# Patient Record
Sex: Female | Born: 1960 | Race: White | Hispanic: No | Smoking: Never smoker
Health system: Southern US, Community
[De-identification: ages and names within clinical notes are randomized; demographics above are authoritative.]

## PROBLEM LIST (undated history)

## (undated) DIAGNOSIS — I499 Cardiac arrhythmia, unspecified: Secondary | ICD-10-CM

## (undated) DIAGNOSIS — K439 Ventral hernia without obstruction or gangrene: Secondary | ICD-10-CM

## (undated) DIAGNOSIS — J45909 Unspecified asthma, uncomplicated: Secondary | ICD-10-CM

## (undated) DIAGNOSIS — Z9221 Personal history of antineoplastic chemotherapy: Secondary | ICD-10-CM

## (undated) DIAGNOSIS — M1990 Unspecified osteoarthritis, unspecified site: Secondary | ICD-10-CM

## (undated) DIAGNOSIS — Z923 Personal history of irradiation: Secondary | ICD-10-CM

## (undated) DIAGNOSIS — C539 Malignant neoplasm of cervix uteri, unspecified: Secondary | ICD-10-CM

## (undated) DIAGNOSIS — E119 Type 2 diabetes mellitus without complications: Secondary | ICD-10-CM

## (undated) DIAGNOSIS — F419 Anxiety disorder, unspecified: Secondary | ICD-10-CM

## (undated) HISTORY — PX: JOINT REPLACEMENT: SHX530

## (undated) HISTORY — PX: TUBAL LIGATION: SHX77

## (undated) HISTORY — DX: Malignant neoplasm of cervix uteri, unspecified: C53.9

## (undated) HISTORY — DX: Cardiac arrhythmia, unspecified: I49.9

## (undated) HISTORY — DX: Anxiety disorder, unspecified: F41.9

## (undated) HISTORY — DX: Unspecified osteoarthritis, unspecified site: M19.90

## (undated) HISTORY — PX: HIP SURGERY: SHX245

---

## 2004-10-01 ENCOUNTER — Emergency Department: Payer: Self-pay | Admitting: Emergency Medicine

## 2005-09-17 ENCOUNTER — Emergency Department: Payer: Self-pay | Admitting: Internal Medicine

## 2005-09-17 ENCOUNTER — Other Ambulatory Visit: Payer: Self-pay

## 2005-09-25 ENCOUNTER — Emergency Department: Payer: Self-pay | Admitting: Emergency Medicine

## 2005-11-29 ENCOUNTER — Emergency Department: Payer: Self-pay | Admitting: Emergency Medicine

## 2005-11-29 ENCOUNTER — Other Ambulatory Visit: Payer: Self-pay

## 2007-08-25 ENCOUNTER — Emergency Department: Payer: Self-pay | Admitting: Emergency Medicine

## 2008-04-11 ENCOUNTER — Emergency Department: Payer: Self-pay | Admitting: Internal Medicine

## 2008-04-27 ENCOUNTER — Ambulatory Visit: Payer: Self-pay | Admitting: Orthopedic Surgery

## 2008-08-15 ENCOUNTER — Emergency Department: Payer: Self-pay | Admitting: Emergency Medicine

## 2008-09-23 ENCOUNTER — Ambulatory Visit: Payer: Self-pay | Admitting: Orthopedic Surgery

## 2008-09-29 ENCOUNTER — Inpatient Hospital Stay: Payer: Self-pay | Admitting: Orthopedic Surgery

## 2014-04-11 ENCOUNTER — Emergency Department: Payer: Self-pay | Admitting: Emergency Medicine

## 2014-04-12 LAB — BASIC METABOLIC PANEL
ANION GAP: 7 (ref 7–16)
BUN: 13 mg/dL (ref 7–18)
CALCIUM: 8.9 mg/dL (ref 8.5–10.1)
CO2: 25 mmol/L (ref 21–32)
Chloride: 106 mmol/L (ref 98–107)
Creatinine: 0.68 mg/dL (ref 0.60–1.30)
EGFR (African American): >60
GLUCOSE: 170 mg/dL — AB (ref 65–99)
Osmolality: 280 (ref 275–301)
Potassium: 3.8 mmol/L (ref 3.5–5.1)
Sodium: 138 mmol/L (ref 136–145)

## 2014-04-12 LAB — TROPONIN I
Troponin-I: <0.02 ng/mL
Troponin-I: <0.02 ng/mL

## 2014-04-12 LAB — CBC
HCT: 40.5 % (ref 35.0–47.0)
HGB: 13.2 g/dL (ref 12.0–16.0)
MCH: 30.3 pg (ref 26.0–34.0)
MCHC: 32.7 g/dL (ref 32.0–36.0)
MCV: 93 fL (ref 80–100)
Platelet: 228 10*3/uL (ref 150–440)
RBC: 4.37 10*6/uL (ref 3.80–5.20)
RDW: 12.6 % (ref 11.5–14.5)
WBC: 10.5 10*3/uL (ref 3.6–11.0)

## 2014-04-12 LAB — CK TOTAL AND CKMB (NOT AT ARMC)
CK, Total: 61 U/L
CK-MB: 1.3 ng/mL (ref 0.5–3.6)

## 2014-12-17 ENCOUNTER — Ambulatory Visit: Payer: Self-pay | Admitting: Family Medicine

## 2014-12-22 ENCOUNTER — Ambulatory Visit: Payer: Self-pay | Admitting: Family Medicine

## 2016-03-01 ENCOUNTER — Other Ambulatory Visit: Payer: Self-pay | Admitting: Family Medicine

## 2016-03-09 ENCOUNTER — Other Ambulatory Visit: Payer: Self-pay | Admitting: Family Medicine

## 2016-03-09 DIAGNOSIS — Z1231 Encounter for screening mammogram for malignant neoplasm of breast: Secondary | ICD-10-CM

## 2016-03-23 ENCOUNTER — Other Ambulatory Visit: Payer: Self-pay | Admitting: Family Medicine

## 2016-03-23 ENCOUNTER — Ambulatory Visit
Admission: RE | Admit: 2016-03-23 | Discharge: 2016-03-23 | Disposition: A | Discharge status: Home / Self Care | Payer: Medicare Other | Source: Ambulatory Visit | Attending: Family Medicine | Admitting: Family Medicine

## 2016-03-23 DIAGNOSIS — Z1231 Encounter for screening mammogram for malignant neoplasm of breast: Secondary | ICD-10-CM | POA: Diagnosis present

## 2016-05-02 DIAGNOSIS — Z96649 Presence of unspecified artificial hip joint: Secondary | ICD-10-CM | POA: Insufficient documentation

## 2016-05-02 DIAGNOSIS — M25559 Pain in unspecified hip: Secondary | ICD-10-CM | POA: Insufficient documentation

## 2016-07-19 ENCOUNTER — Other Ambulatory Visit: Payer: Self-pay | Admitting: Orthopedic Surgery

## 2016-07-19 DIAGNOSIS — M25551 Pain in right hip: Secondary | ICD-10-CM

## 2016-07-19 DIAGNOSIS — Z96641 Presence of right artificial hip joint: Secondary | ICD-10-CM

## 2016-08-01 ENCOUNTER — Ambulatory Visit
Admission: RE | Admit: 2016-08-01 | Discharge: 2016-08-01 | Disposition: A | Discharge status: Home / Self Care | Payer: Medicare Other | Source: Ambulatory Visit | Attending: Orthopedic Surgery | Admitting: Orthopedic Surgery

## 2016-08-01 DIAGNOSIS — M5136 Other intervertebral disc degeneration, lumbar region: Secondary | ICD-10-CM | POA: Insufficient documentation

## 2016-08-01 DIAGNOSIS — M8538 Osteitis condensans, other site: Secondary | ICD-10-CM | POA: Diagnosis not present

## 2016-08-01 DIAGNOSIS — Z96641 Presence of right artificial hip joint: Secondary | ICD-10-CM | POA: Insufficient documentation

## 2016-08-01 DIAGNOSIS — M47896 Other spondylosis, lumbar region: Secondary | ICD-10-CM | POA: Diagnosis not present

## 2016-08-01 DIAGNOSIS — M25551 Pain in right hip: Secondary | ICD-10-CM | POA: Diagnosis not present

## 2017-02-08 ENCOUNTER — Telehealth: Payer: Self-pay | Admitting: Obstetrics & Gynecology

## 2017-02-08 NOTE — Telephone Encounter (Signed)
[facility] is referring Pt for Cervical Lesion postmenopausal Bleeding. I Left voicemail for pt to call back to be schedule.

## 2017-02-12 NOTE — Telephone Encounter (Signed)
Pt is schedule with Dr. Kenton Kingfisher 02/26/17

## 2017-02-23 ENCOUNTER — Telehealth: Payer: Self-pay | Admitting: Obstetrics & Gynecology

## 2017-02-23 NOTE — Telephone Encounter (Signed)
Spoke with pt about reschedule referral. Pt states she preferred to see an Female MD. Will be referred to different provider.

## 2017-02-23 NOTE — Telephone Encounter (Signed)
-----   Message from Gae Dry, MD sent at 02/23/2017 10:31 AM EDT ----- Regarding: appt Recheck on appointment for this patient as I do not see her on schedule.  Thx.

## 2017-02-26 ENCOUNTER — Ambulatory Visit: Payer: Self-pay | Admitting: Obstetrics & Gynecology

## 2017-02-26 ENCOUNTER — Other Ambulatory Visit: Payer: Self-pay | Admitting: Family Medicine

## 2017-02-26 DIAGNOSIS — Z1231 Encounter for screening mammogram for malignant neoplasm of breast: Secondary | ICD-10-CM

## 2017-03-23 DIAGNOSIS — C539 Malignant neoplasm of cervix uteri, unspecified: Secondary | ICD-10-CM

## 2017-03-23 HISTORY — DX: Malignant neoplasm of cervix uteri, unspecified: C53.9

## 2017-03-26 ENCOUNTER — Ambulatory Visit
Admission: RE | Admit: 2017-03-26 | Discharge: 2017-03-26 | Disposition: A | Discharge status: Home / Self Care | Payer: Medicare Other | Source: Ambulatory Visit | Attending: Family Medicine | Admitting: Family Medicine

## 2017-03-26 DIAGNOSIS — Z1231 Encounter for screening mammogram for malignant neoplasm of breast: Secondary | ICD-10-CM | POA: Insufficient documentation

## 2017-03-28 ENCOUNTER — Encounter: Payer: Self-pay | Admitting: Obstetrics & Gynecology

## 2017-03-28 ENCOUNTER — Emergency Department
Admission: EM | Admit: 2017-03-28 | Discharge: 2017-03-28 | Discharge status: Against Medical Advice | Payer: Medicare Other | Attending: Emergency Medicine | Admitting: Emergency Medicine

## 2017-03-28 ENCOUNTER — Encounter: Payer: Self-pay | Admitting: Medical Oncology

## 2017-03-28 ENCOUNTER — Emergency Department: Payer: Medicare Other

## 2017-03-28 ENCOUNTER — Ambulatory Visit (INDEPENDENT_AMBULATORY_CARE_PROVIDER_SITE_OTHER): Payer: Medicare Other | Admitting: Obstetrics & Gynecology

## 2017-03-28 VITALS — BP 140/80 | HR 93 | Ht 59.0 in | Wt 150.0 lb

## 2017-03-28 DIAGNOSIS — R87619 Unspecified abnormal cytological findings in specimens from cervix uteri: Secondary | ICD-10-CM

## 2017-03-28 DIAGNOSIS — N950 Postmenopausal bleeding: Secondary | ICD-10-CM | POA: Diagnosis not present

## 2017-03-28 DIAGNOSIS — N3090 Cystitis, unspecified without hematuria: Secondary | ICD-10-CM | POA: Diagnosis not present

## 2017-03-28 DIAGNOSIS — M47816 Spondylosis without myelopathy or radiculopathy, lumbar region: Secondary | ICD-10-CM | POA: Diagnosis not present

## 2017-03-28 DIAGNOSIS — M545 Low back pain: Secondary | ICD-10-CM | POA: Diagnosis present

## 2017-03-28 LAB — URINALYSIS, COMPLETE (UACMP) WITH MICROSCOPIC
BACTERIA UA: NONE SEEN
BILIRUBIN URINE: NEGATIVE
GLUCOSE, UA: NEGATIVE mg/dL
HGB URINE DIPSTICK: NEGATIVE
Ketones, ur: NEGATIVE mg/dL
NITRITE: NEGATIVE
Protein, ur: NEGATIVE mg/dL
Specific Gravity, Urine: 1.025 (ref 1.005–1.030)
pH: 5 (ref 5.0–8.0)

## 2017-03-28 LAB — PREGNANCY, URINE: PREG TEST UR: NEGATIVE

## 2017-03-28 MED ORDER — CEPHALEXIN 500 MG PO CAPS: 500.0000 mg | ORAL_CAPSULE | Freq: Two times a day (BID) | ORAL | 0 refills | Status: AC

## 2017-03-28 NOTE — ED Notes (Signed)
Pt presents with back pain that feels like burning x 4 days. Pt states that it goes from lower to upper back. Pt had recent hip replacement and is going to have a procedure on uterus today. Pt took ibuprofen with no relief. NAD Noted.

## 2017-03-28 NOTE — ED Provider Notes (Signed)
[facility] Emergency Department Provider Note  ____________________________________________  Time seen: Approximately 12:08 PM  I have reviewed the triage vital signs and the nursing notes.   HISTORY  Chief Complaint Back Pain    HPI Kendra Herring is a 56 y.o. female that presents to emergency department with low midline back pain for 3 days. Pain starts at the bottom of her back and goes up her back about midway. She has an appointment today with gynecology for an abnormal Pap smear that she recently had. She has never had a urinary tract infection or kidney stone before. She denies fever, shortness of breath, chest pain, nausea, vomiting, abdominal pain, bowel or bladder dysfunction, saddle paresthesias, dysuria, hematuria, urgency, frequency, leg pain, numbness, tingling.   Past Medical History:  Diagnosis Date  . Anxiety   . Arthritis     There are no active problems to display for this patient.   Past Surgical History:  Procedure Laterality Date  . HIP SURGERY    . TUBAL LIGATION      Prior to Admission medications   Medication Sig Start Date End Date Taking? Authorizing Provider  cephALEXin (KEFLEX) 500 MG capsule Take 1 capsule (500 mg total) by mouth 2 (two) times daily. 03/28/17 04/07/17  Laban Emperor, PA-C    Allergies Other  Family History  Problem Relation Age of Onset  . Diabetes Mother     Social History Social History  Substance Use Topics  . Smoking status: Never Smoker  . Smokeless tobacco: Never Used  . Alcohol use No     Review of Systems  Constitutional: No fever/chills Cardiovascular: No chest pain. Respiratory: No SOB. Gastrointestinal: No abdominal pain.  No nausea, no vomiting.  Musculoskeletal: Positive for back pain. Skin: Negative for rash, abrasions, lacerations, ecchymosis. Neurological: Negative for headaches, numbness or tingling   ____________________________________________   PHYSICAL  EXAM:  VITAL SIGNS: ED Triage Vitals  Enc Vitals Group     BP 03/28/17 1022 (!) 151/80     Pulse Rate 03/28/17 1022 95     Resp 03/28/17 1022 18     Temp 03/28/17 1022 98.1 F (36.7 C)     Temp Source 03/28/17 1022 Oral     SpO2 03/28/17 1022 97 %     Weight 03/28/17 1023 140 lb (63.5 kg)     Height 03/28/17 1023 4\' 11"  (1.499 m)     Head Circumference --      Peak Flow --      Pain Score 03/28/17 1022 9     Pain Loc --      Pain Edu? --      Excl. in [place]? --      Constitutional: Alert and oriented. Well appearing and in no acute distress. Eyes: Conjunctivae are normal. PERRL. EOMI. Head: Atraumatic. ENT:      Ears:      Nose: No congestion/rhinnorhea.      Mouth/Throat: Mucous membranes are moist.  Neck: No stridor.   Cardiovascular: Normal rate, regular rhythm.  Good peripheral circulation. Respiratory: Normal respiratory effort without tachypnea or retractions. Lungs CTAB. Good air entry to the bases with no decreased or absent breath sounds. Gastrointestinal: Bowel sounds 4 quadrants. Soft and nontender to palpation. No guarding or rigidity. No palpable masses. No distention. No CVA tenderness. Musculoskeletal: Full range of motion to all extremities. No gross deformities appreciated. Tenderness to palpation over midline lumbar spine. No tenderness to palpation over lumbar muscles. Neurologic:  Normal speech and  language. No gross focal neurologic deficits are appreciated.  Skin:  Skin is warm, dry and intact. No rash noted.   ____________________________________________   LABS (all labs ordered are listed, but only abnormal results are displayed)  Labs Reviewed  URINALYSIS, COMPLETE (UACMP) WITH MICROSCOPIC - Abnormal; Notable for the following:       Result Value   Color, Urine YELLOW (*)    APPearance CLEAR (*)    Leukocytes, UA MODERATE (*)    Squamous Epithelial / LPF 0-5 (*)    All other components within normal limits  URINE CULTURE  PREGNANCY, URINE    ____________________________________________  EKG   ____________________________________________  RADIOLOGY Robinette Haines, personally viewed and evaluated these images (plain radiographs) as part of my medical decision making, as well as reviewing the written report by the radiologist.  Dg Lumbar Spine Complete  Result Date: 03/28/2017 CLINICAL DATA:  Low back pain. EXAM: LUMBAR SPINE - COMPLETE 4+ VIEW COMPARISON:  Chest x-ray dated 04/12/2014 FINDINGS: There is lumbarization of the S1 segment. There is moderately severe degenerative disc disease at L4-5 and L5-S1 with disc space narrowing and sclerosis of the vertebral endplates. Alignment is anatomic. Moderately severe facet arthritis at L4-5 and L5-S1 bilaterally. No fractures or bone destruction. 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 lumbarization of the S1 segment. Electronically Signed   By: Lorriane Shire M.D.   On: 03/28/2017 12:22   Ct Renal Stone Study  Result Date: 03/28/2017 CLINICAL DATA:  Back pain for 4 days EXAM: CT ABDOMEN AND PELVIS WITHOUT CONTRAST TECHNIQUE: Multidetector CT imaging of the abdomen and pelvis was performed following the standard protocol without IV contrast. COMPARISON:  03/28/2017 FINDINGS: Lower chest: Lung bases are clear. No consolidation or effusion. The heart is nonenlarged. Hepatobiliary: No focal liver abnormality is seen. No gallstones, gallbladder wall thickening, or biliary dilatation. Pancreas: Unremarkable. No pancreatic ductal dilatation or surrounding inflammatory changes. Spleen: Normal in size without focal abnormality. Adrenals/Urinary Tract: Adrenal glands are unremarkable. Kidneys are normal, without renal calculi, focal lesion, or hydronephrosis. Bladder is unremarkable. Stomach/Bowel: Stomach is within normal limits. Appendix appears normal. No evidence of bowel wall thickening, distention, or inflammatory changes. Vascular/Lymphatic: No  significant vascular findings are present. No enlarged abdominal or pelvic lymph nodes. Reproductive: Uterus and bilateral adnexa are unremarkable. Other: No free air or free fluid. Musculoskeletal: Degenerative changes of the spine. No acute or suspicious bone lesion. IMPRESSION: 1. Negative for nephrolithiasis, hydronephrosis or ureteral stone. 2. There are no acute abnormalities visualized. Electronically Signed   By: Donavan Foil M.D.   On: 03/28/2017 14:51    ____________________________________________    PROCEDURES  Procedure(s) performed:    Procedures    Medications - No data to display   ____________________________________________   INITIAL IMPRESSION / ASSESSMENT AND PLAN / ED COURSE  Pertinent labs & imaging results that were available during my care of the patient were reviewed by me and considered in my medical decision making (see chart for details).  Review of the [place] CSRS was performed in accordance of the [place] prior to dispensing any controlled drugs.  Patient's diagnosis is consistent with cystitis and osteoarthritis. Vital signs and exam are reassuring. Patient seems to have a lot of anxiety regarding her colposcopy today. Lumbar x-ray indicates osteoarthritis. Urinalysis shows leukocytes and white blood cells she will be treated for urinary tract infection. Patient had to leave the ED for her gynecology appointment before results of CT renal stone study was completed  so she signed out AMA. No indication of any stone or hydronephrosis on CT. Patient will be discharged home with prescriptions for Keflex. Patient is to follow up with PCP as directed. Patient is given ED precautions to return to the ED for any worsening or new symptoms.     ____________________________________________  FINAL CLINICAL IMPRESSION(S) / ED DIAGNOSES  Final diagnoses:  Spondylosis of lumbar region without myelopathy or radiculopathy  Cystitis      NEW MEDICATIONS STARTED  DURING THIS VISIT:  Discharge Medication List as of 03/28/2017  2:29 PM    START taking these medications   Details  cephALEXin (KEFLEX) 500 MG capsule Take 1 capsule (500 mg total) by mouth 2 (two) times daily., Starting Wed 03/28/2017, Until Sat 04/07/2017, Print            This chart was dictated using voice recognition software/Dragon. Despite best efforts to proofread, errors can occur which can change the meaning. Any change was purely unintentional.    Laban Emperor, PA-C 03/28/17 1535    Burlene Arnt Gerda Diss, MD 03/29/17 1110

## 2017-03-28 NOTE — ED Triage Notes (Signed)
Pt reports lower back pain that began 3-4 days ago without injury. Pt reports pain radiates into rt hip

## 2017-03-28 NOTE — Patient Instructions (Signed)

## 2017-03-28 NOTE — ED Notes (Signed)
Called lab to call their attention to preg add-on test.

## 2017-03-28 NOTE — Progress Notes (Signed)
Referring Provider:  Princella Ion  HPI:  Kendra Herring is a 56 y.o.  [phone]  who presents today for evaluation and management of abnormal cervical cytology.  Pt also had abnormal exam appearance of cervix.  She first noted spotting and bleeding, which is post menopausal as ir has been > 1 year without a period, 2 mos ago.  Last PAP uncertain.  Dysplasia History:  AGUS, FAVOR HIGH RISK CELLS  ROS:  Pertinent items noted in HPI and remainder of comprehensive ROS otherwise negative.  OB History  Gravida Para Term Preterm AB Living  3 3 3     3   SAB TAB Ectopic Multiple Live Births               # Outcome Date GA Lbr Len/2nd Weight Sex Delivery Anes PTL Lv  3 Term 08/05/89          2 Term 09/09/79          1 Term 10/16/69              Past Medical History:  Diagnosis Date  . Anxiety   . Arthritis     Past Surgical History:  Procedure Laterality Date  . HIP SURGERY    . TUBAL LIGATION      SOCIAL HISTORY: History  Alcohol Use No   History  Drug Use No     Family History  Problem Relation Age of Onset  . Diabetes Mother     ALLERGIES:  Other  No current outpatient prescriptions on file prior to visit.   No current facility-administered medications on file prior to visit.     Physical Exam: -Vitals:  BP 140/80   Pulse 93   Ht 4\' 11"  (1.499 m)   Wt 150 lb (68 kg)   BMI 30.30 kg/m  GEN: WD, WN, NAD.  A+ O x 3, good mood and affect. ABD:  NT, ND.  Soft, no masses.  No hernias noted.   Pelvic:   Vulva: Normal appearance.  No lesions.  Vagina: No lesions or abnormalities noted.  Support: Normal pelvic support.  Urethra No masses tenderness or scarring.  Meatus Normal size without lesions or prolapse.  Cervix: See below.  Anus: Normal exam.  No lesions.  Perineum: Normal exam.  No lesions.        Bimanual   Uterus: Normal size.  Non-tender.  Mobile.  AV.  Adnexae: No masses.  Non-tender to palpation.  Cul-de-sac: Negative for abnormality.    PROCEDURE: 1.  Urine Pregnancy Test:  not done 2.  Colposcopy performed with 4% acetic acid after verbal consent obtained                                         -Aceto-white Lesions Location(s): throughout, abnormal appearance, contact bleeding              -Biopsy performed at 10,12,6 o'clock               -ECC indicated and performed: Yes.       -Biopsy sites made hemostatic with pressure, AgNO3, and/or Monsel's solution   -Satisfactory colposcopy: Yes.      -Evidence of Invasive cervical CA :  Possible  Endometrial Biopsy After discussion with the patient regarding her abnormal uterine bleeding I recommended that she proceed with an endometrial biopsy for further diagnosis. The risks, benefits, alternatives, and  indications for an endometrial biopsy were discussed with the patient in detail. She understood the risks including infection, bleeding, cervical laceration and uterine perforation.  Verbal consent was obtained.   PROCEDURE NOTE:  P[place] endometrial biopsy was performed using aseptic technique with iodine preparation.  The uterus was sounded to a length of 6 cm.  Adequate sampling was obtained with minimal blood loss.  The patient tolerated the procedure well.  Disposition will be pending pathology.  ASSESSMENT:  Kendra Herring is a 56 y.o. [phone] here for  1. Postmenopausal bleeding   2. 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  PLAN: 1.  I discussed the grading system of pap smears and HPV high risk viral types.  We will discuss and base management after colpo results return. 2. Follow up PAP 6 months, vs intervention if high grade dysplasia identified 3. Treatment of persistantly abnormal PAP smears and cervical dysplasia, even mild, is discussed w pt today in detail, as well as the pros and cons of Cryo and LEEP procedures. Will consider and discuss after results. 4. EMB due to AGUS and PMB 5. Referral if cancer to [place], MD, [facility] 03/28/2017  4:26 PM

## 2017-03-29 LAB — URINE CULTURE: CULTURE: NO GROWTH

## 2017-04-02 ENCOUNTER — Telehealth: Payer: Self-pay

## 2017-04-02 ENCOUNTER — Telehealth: Payer: Self-pay | Admitting: Obstetrics & Gynecology

## 2017-04-02 LAB — PATHOLOGY

## 2017-04-02 NOTE — Telephone Encounter (Signed)
Pt is would like a call back of Labs results. Pt is nervous about her result. Please advise pt.

## 2017-04-02 NOTE — Telephone Encounter (Signed)
Not back yet...   Let her know will call as soon as get it.

## 2017-04-02 NOTE — Progress Notes (Signed)
EMB Neg.  Awaiting cervical biopsies, then to call patient w results.

## 2017-04-02 NOTE — Telephone Encounter (Signed)
Dr. Fonnie Mu called with results from Colposcopy. 4 specimens were received. 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 Fax number was given to receive results. The Dr. Lenon Ahmadi [facility] can call with any questions at [phone]. Results will be faxed later this afternoon.   KJ CMA

## 2017-04-02 NOTE — Telephone Encounter (Signed)
Pt aware.

## 2017-04-02 NOTE — Telephone Encounter (Signed)
Please advise. Thank you

## 2017-04-03 NOTE — Progress Notes (Signed)
LM to call back.  Cervical Biopsies- cancer.

## 2017-04-03 NOTE — Telephone Encounter (Signed)
Results received yesterday and placed on [facility] desk for review

## 2017-04-05 NOTE — Progress Notes (Signed)
[phone] pt phone number, pt aware you will be calling her to go over results

## 2017-04-05 NOTE — Progress Notes (Signed)
I have tried to reach patient several phone calls with no answer.  Please see if you can arrange appt to check on patient (and also for me to give results and plan accordingly).  Or if she has no time then at least a time or number I can call to reach her.  Thx for helping.

## 2017-04-06 ENCOUNTER — Other Ambulatory Visit: Payer: Self-pay | Admitting: Obstetrics & Gynecology

## 2017-04-06 DIAGNOSIS — C539 Malignant neoplasm of cervix uteri, unspecified: Secondary | ICD-10-CM | POA: Insufficient documentation

## 2017-04-06 NOTE — Progress Notes (Signed)
Referral sent to [facility].

## 2017-04-06 NOTE — Progress Notes (Signed)
Is this [facility]?

## 2017-04-06 NOTE — Progress Notes (Signed)
D/w patient cancer diagnosis.  Questions answered.  Advised we will set up referral to [facility] for the next available Wednesday, hopefully the June 20th.  Please help arrange, I will put in order now.

## 2017-04-06 NOTE — Progress Notes (Signed)
OK.  Didn't know what [facility] meant

## 2017-04-09 ENCOUNTER — Telehealth: Payer: Self-pay

## 2017-04-09 NOTE — Telephone Encounter (Signed)
Pt was told by [facility] that she had cancer.  She would like to know where it is.  [phone]

## 2017-04-09 NOTE — Telephone Encounter (Signed)
Please advise 

## 2017-04-11 ENCOUNTER — Inpatient Hospital Stay: Payer: Medicare Other | Attending: Obstetrics and Gynecology | Admitting: Obstetrics and Gynecology

## 2017-04-11 VITALS — BP 128/62 | HR 86 | Temp 97.7°F | Resp 18 | Ht 59.0 in | Wt 149.9 lb

## 2017-04-11 DIAGNOSIS — M1990 Unspecified osteoarthritis, unspecified site: Secondary | ICD-10-CM

## 2017-04-11 DIAGNOSIS — M549 Dorsalgia, unspecified: Secondary | ICD-10-CM | POA: Diagnosis not present

## 2017-04-11 DIAGNOSIS — C539 Malignant neoplasm of cervix uteri, unspecified: Secondary | ICD-10-CM | POA: Insufficient documentation

## 2017-04-11 DIAGNOSIS — Z780 Asymptomatic menopausal state: Secondary | ICD-10-CM | POA: Insufficient documentation

## 2017-04-11 DIAGNOSIS — Z79899 Other long term (current) drug therapy: Secondary | ICD-10-CM | POA: Diagnosis not present

## 2017-04-11 DIAGNOSIS — F419 Anxiety disorder, unspecified: Secondary | ICD-10-CM | POA: Diagnosis not present

## 2017-04-11 NOTE — Progress Notes (Signed)
  Oncology Nurse Navigator Documentation Chaperoned pelvic exam. Orders entered for PET and MRI. See Dr. Fransisca Connors next week 6/27 for results Navigator Location: [facility] (04/11/17 1300)   )Navigator Encounter Type: Initial GynOnc (04/11/17 1300)                 Multidisiplinary Clinic Type: GYN (04/11/17 1300)   Patient Visit Type: GynOnc (04/11/17 1300)                              Time Spent with Patient: 30 (04/11/17 1300)

## 2017-04-11 NOTE — Progress Notes (Signed)
Gynecologic Oncology Consult Visit   Referring Provider: Gae Dry, MD   Chief Concern: cervical cancer  Subjective:  Kendra Herring is a 56 y.o. female who is seen in consultation from Dr. Kenton Kingfisher for new diagnosis of invasive cervical cancer.   Kendra Herring is a pleasant postmenopausal [phone] who presented to Dr. Kenton Kingfisher evaluation and management of abnormal cervical cytology, AGUS, favor high risk cells.  Pt also had abnormal exam appearance of cervix on exam.  She first noted spotting and bleeding, which is post menopausal as ir has been > 1 year without a period, 2 mos ago.  Date of last PAP prior to this recent results is uncertain.   Colposcopy performed with 4% acetic acid as well as EMBx. Exam notable for the following: Aceto-white Lesions Location(s): throughout, abnormal appearance, contact bleeding               Part A: CERVIX, 6:00, BIOPSY:  INVASIVE SQUAMOUS CELL CARCINOMA.  Part B: CERVIX, 10:00, BIOPSY:  HIGH-GRADE SQUAMOUS INTRAEPITHELIAL LESION (CIN 3).  CHRONIC CERVICITIS WITH SQUAMOUS METAPLASIA.  Part C: CERVIX, 12:00, BIOPSY:  FOCAL HIGH-GRADE SQUAMOUS INTRAEPITHELIAL LESION (CIN 3).  CHRONIC CERVICITIS.  Part D: ENDOCERVIX, CURETTINGS:  INVASIVE SQUAMOUS CELL CARCINOMA.  FRAGMENTED BIOPSY SPECIMEN.  COMMENT: THIS CASE WAS REVIEWED IN INTRADEPARTMENTAL CONSULTATION AND THE  DIAGNOSIS REFLECTS OUR CONSENSUS OPINION. THE RESULTS WERE CALLED TO  DR.HARRIS'S OFFICE BY DR.QAYUMI AND GIVEN TO MS.KIM ON 04/02/2017.I  MQA/04/02/2017   ENDOMETRIUM, BIOPSY:  SCANT FRAGMENTS OF INACTIVE ENDOMETRIUM, MIXED WITH MINUTE FRAGMENTS OF  BENIGN ENDOCERVICAL GLANDS, MUCUS, AND BLOOD. NO HYPERPLASIA OR  CARCINOMA.   She was seen in the ER 03/28/2017 for back pain. Workup included   CT scan - dedicated renal protocol: 1. Negative for nephrolithiasis, hydronephrosis or ureteral stone. 2. There are no acute abnormalities visualized.  She was diagnosed with a UTI, but  final culture negative.    She presents today for evaluation.   Problem List: Patient Active Problem List   Diagnosis Date Noted  . Cervical carcinoma ([place]) 04/06/2017    Past Medical History: Past Medical History:  Diagnosis Date  . Anxiety   . Arrhythmia   . Arthritis   . Cervical cancer [facility])     Past Surgical History: Past Surgical History:  Procedure Laterality Date  . HIP SURGERY    . TUBAL LIGATION      Past Gynecologic History:  Menarche: 15 Menstrual details: postmenopausal Menses regular: no Last Menstrual Period: a year ago History of Abnormal pap: yes, glandular cell abnormality (AGUS) Last pap: see HPI Contraception: bilateral tubal ligation   OB History:  OB History  Gravida Para Term Preterm AB Living  3 3 3     3   SAB TAB Ectopic Multiple Live Births               # Outcome Date GA Lbr Len/2nd Weight Sex Delivery Anes PTL Lv  3 Term 08/05/89          2 Term 09/09/79          1 Term 10/16/69              Family History: Family History  Problem Relation Age of Onset  . Diabetes Mother     Social History: Social History   Social History  . Marital status: Divorced    Spouse name: N/A  . Number of children: N/A  . Years of education: N/A   Occupational History  .  Not on file.   Social History Main Topics  . Smoking status: Never Smoker  . Smokeless tobacco: Never Used  . Alcohol use No  . Drug use: No  . Sexual activity: Yes    Birth control/ protection: None   Other Topics Concern  . Not on file   Social History Narrative  . No narrative on file    Allergies: No Known Allergies  Current Medications: Current Outpatient Prescriptions  Medication Sig Dispense Refill  . cephALEXin (KEFLEX) 500 MG capsule Take 500 mg by mouth 2 (two) times daily.    Marland Kitchen ibuprofen (ADVIL,MOTRIN) 800 MG tablet Take 800 mg by mouth every 8 (eight) hours as needed.     No current facility-administered medications for this visit.      Review of Systems General: negative for, fevers she has noted weight gain and night sweats Skin: negative for changes in color, texture, moles or lesions Eyes: negative for, changes in vision HEENT: negative for, change in hearing, voice changes Pulmonary: positive for SOB, productive cough Cardiac: negative for, palpitations, pain Gastrointestinal: negative for, nausea, vomiting, constipation, diarrhea, hematemesis, hematochezia Genitourinary/Sexual: negative for, dysuria, stones Ob/Gyn: irregular bleeding Musculoskeletal: pain back and legs Hematology: negative for, easy bruising Neurologic/Psych: positive for weakness and numbness and anxiety, negative for, headaches, seizures, paralysis  Objective:  Physical Examination:  BP 128/62   Pulse 86   Temp 97.7 F (36.5 C)   Resp 18   Ht 4\' 11"  (1.499 m)   Wt 149 lb 14.4 oz (68 kg)   BMI 30.28 kg/m    ECOG Performance Status: 1 - Symptomatic but completely ambulatory  General appearance: alert, cooperative and appears stated age. She walks with support due to right hip issues HEENT:PERRLA, extra ocular movement intact, sclera clear, anicteric and neck supple with midline trachea Lymph node survey: non-palpable, axillary, inguinal, supraclavicular Cardiovascular: regular rate and rhythm Respiratory: normal air entry, lungs clear to auscultation Abdomen: slightly distended, but soft, non-tender, without masses or organomegaly and no hernias Back: inspection of back is normal Extremities: edema 1 + symmetrical otherwise negative Skin exam - normal coloration and turgor, no rashes, no suspicious skin lesions noted. Neurological exam reveals alert, oriented, normal speech, no focal findings   Pelvic: exam chaperoned by nurse;  Vulva: normal appearing vulva with no masses, tenderness or lesions; Vagina: normal vagina; Adnexa: no masses, unable to fully assess parametria due to patient discomfort, no obvious nodularity, perhaps  left uterosacral thickening but exam limited; Uterus: uterus is not grossly enlarged and nontender; Cervix: multiparous appearance and grossly abnormal appearing which may be all related to cancer vs ectropion, the lesion appears to be extending to the posterior vagina. The cervix is firm to palpation; Rectal: confirmatory    Lab Review Labs on site today: n/a  Radiologic Imaging: PET and MRI pelvis ordered    Assessment:  Kendra Herring is a 56 y.o. 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 Concern for at least stage IB1 disease. However, right back pain is concerning for locally advanced disease, negative CT scan is reassuring.   Medical co-morbidities complicating care: history of cardiac arrythmia, s/p hip surgery with mobility issues, and arthritis.   Plan:   Problem List Items Addressed This Visit      Genitourinary   Cervical carcinoma ([place]) - Primary   Relevant Medications   cephALEXin (KEFLEX) 500 MG capsule   Other Relevant Orders   NM PET  Image Initial (PI) Skull Base To Thigh   MR PELVIS W WO CONTRAST      We discussed options for management, however, we need additional testing to assess for metastatic disease, tumor volume/distribution, to complete staging. We briefly reviewed surgical options vs primary chemoradiation. We also discussed that if she does undergo surgery,  we may recommend adjuvant chemoradiation if she has concerning pathologic features. A PET scan and pelvic MRI have been ordered. In addition, we will request [facility] pathology review of the biopsies. If she has greater than 3 mm disease in the specimens (biopsy sizes ranged from 4-6 mm) we know that simple hysterectomy would not be sufficient for management and radical hysterectomy would be recommended if she does not have evidence of metastatic disease.    Suggested return to clinic in  1 week to see Dr. Fransisca Connors for radiologic review  and repeat exam.    The patient's diagnosis, an outline of the further diagnostic and laboratory studies which will be required, the recommendation, and alternatives were discussed.  All questions were answered to the patient's satisfaction.  A total of 60 minutes were spent with the patient/family today; 75% was spent in education, counseling and coordination of care for cervical cancer.    Gillis Ends, MD    CC:  Gae Dry, MD

## 2017-04-11 NOTE — Patient Instructions (Signed)
Cervical Cancer The cervix is the opening and bottom part of the uterus between the vagina and the uterus. Cervical cancer is a fairly common cancer. It occurs most often in women between the ages of 43 years and 18 years. Cells of the cervix act very much like skin cells. These cells are exposed to toxins, viruses, and bacteria that may cause abnormal changes. There are two kinds of cancers of the cervix:  Squamous cell carcinoma. This type of cancer starts in the flat or scale-like cells that line the cervix. Squamous cell carcinoma can develop from a sexually transmitted infection caused by the human papillomavirus (HPV).  Adenocarcinoma. This type of cervical cancer starts in glandular cells that line the cervix.  What increases the risk? The risk of getting cancer of the cervix is related to your lifestyle, sexual history, health, and immune system. Risks for cervical cancer include:  Having a sexually transmitted viral infection. These include: ? Chlamydia. ? Herpes. ? HPV.  Becoming sexually active before age 36 years.  Having more than one sexual partner or having sex with someone who has more than one sexual partner.  Not using condoms with sexual partners.  Having had cancer of the vagina or vulva.  Having a sexual partner who has or had cancer of the penis or who has had a sexual partner with abnormal cervical cells (dysplasia) or cervical cancer.  Using oral contraceptives (also called birth control pills).  Smoking.  Having a weakened immune system. For example, human immunodeficiency virus (HIV) or other immune deficiency disorders.  Being the daughter of a woman who took diethylstilbestrol (DES) during pregnancy.  Having a sister or mother who has had cancer of the cervix.  Being [place] American, Hispanic, Asian, or a woman from the [place].  A history of dysplasia of the cervix.  What are the signs or symptoms? Symptoms are usually not present in the  early stages of cervical cancer. Once the cancer invades the cervix and surrounding tissues, the woman may have:  Abnormal vaginal bleeding or menstrual bleeding that is longer or heavier than usual.  Bleeding after intercourse, douching, or a Pap test.  Vaginal bleeding following menopause.  Abnormal vaginal discharge.  Pelvic discomfort or pain.  An abnormal Pap test.  Pain during sexual intercourse.  Symptoms of more advanced cervical cancer may include:  Loss of appetite or weight loss.  Tiredness (fatigue).  Back and leg pain.  Inability to control urination or bowel movements.  How is this diagnosed? A pelvic exam and Pap test are done to diagnose the condition. If abnormalities are found during the exam or Pap test, the Pap test may be repeated in 3 months, or your health care provider may do additional tests or procedures, such as:  A colposcopy. This is a procedure that uses a special microscope that allows the health care provider to magnify and closely examine the cells of the cervix, vagina, and vulva.  Cervical biopsies. This is a procedure where small tissue samples are taken from the cervix to be examined under a microscope by a specialist.  A cone biopsy. This is a procedure to test for or remove cancerous tissue.  Other tests may be needed, including:  Cystoscopy.  Proctoscopy or sigmoidoscopy.  Ultrasound.  CT scan.  MRI.  Laparoscopy.  There are different stages of cervical cancer:  Stage 0, carcinoma in situ (CIS)-This first stage of cancer is the last and most serious stage of dysplasia.  Stage I-This means  the tumor is in the uterus and cervix only.  Stage II-This means the tumor has spread to the upper vagina. The cancer has spread beyond the uterus but not to the pelvic walls or lower third of the vagina.  Stage III-This means the tumor has invaded the side wall of the pelvis and the lower third of the vagina. If the tumor blocks the  tubes that carry urine to the bladder (ureters), it may cause urine to back up and the kidneys to swell (hydronephrosis).  Stage IV-This means the tumor has spread to the rectum or bladder. In the later part of this stage, it has also spread to distant organs, like the lungs.  How is this treated? Treatment options can include:  Cone biopsy to remove the cancerous tissue.  Removal of the entire uterus and cervix.  Removal of the uterus, cervix, upper vagina, lymph nodes, and surrounding tissue (modified radical hysterectomy). The ovaries may be left in place or removed.  Medicines to treat cancer.  A combination of surgery, radiation, and chemotherapy.  Biological response modifiers. These are substances that help strengthen your immune system's fight against cancer or infection. They may be used in combination with chemotherapy.  Follow these instructions at home:  Get a gynecology exam and Pap test once every year or as directed by your health care provider.  Get the HPV vaccine.  Do not smoke.  Do not have sexual intercourse until your health care provider says it is okay.  Use a condom every time you have sex. Contact a health care provider if:  You have increased pelvic pain or pressure.  Your are becoming increasingly tired.  You have increased leg or back pain.  You have a fever.  You have abnormal bleeding or discharge.  You lose weight. Get help right away if:  You cannot urinate.  You have blood in your urine.  You have blood or pressure with a bowel movement.  You develop severe back, stomach, or pelvic pain. This information is not intended to replace advice given to you by your health care provider. Make sure you discuss any questions you have with your health care provider. Document Released: 10/09/2005 Document Revised: 03/22/2016 Document Reviewed: 04/02/2013 Elsevier Interactive Patient Education  2017 Elsevier Inc.  

## 2017-04-11 NOTE — Progress Notes (Signed)
Pt new pt with dx cervical cancer, she is anxious about coming but it is because she just needs to know the plan and what stage and what the plan for the pt is ; probable surgery

## 2017-04-17 ENCOUNTER — Ambulatory Visit: Payer: Medicare Other

## 2017-04-17 ENCOUNTER — Ambulatory Visit
Admission: RE | Admit: 2017-04-17 | Discharge: 2017-04-17 | Disposition: A | Discharge status: Home / Self Care | Payer: Medicare Other | Source: Ambulatory Visit | Attending: Obstetrics and Gynecology | Admitting: Obstetrics and Gynecology

## 2017-04-17 DIAGNOSIS — Z96641 Presence of right artificial hip joint: Secondary | ICD-10-CM | POA: Diagnosis not present

## 2017-04-17 DIAGNOSIS — C539 Malignant neoplasm of cervix uteri, unspecified: Secondary | ICD-10-CM

## 2017-04-17 DIAGNOSIS — M5136 Other intervertebral disc degeneration, lumbar region: Secondary | ICD-10-CM | POA: Diagnosis not present

## 2017-04-17 DIAGNOSIS — K439 Ventral hernia without obstruction or gangrene: Secondary | ICD-10-CM

## 2017-04-17 DIAGNOSIS — M47896 Other spondylosis, lumbar region: Secondary | ICD-10-CM | POA: Diagnosis not present

## 2017-04-17 HISTORY — DX: Ventral hernia without obstruction or gangrene: K43.9

## 2017-04-17 LAB — GLUCOSE, CAPILLARY: GLUCOSE-CAPILLARY: 125 mg/dL — AB (ref 65–99)

## 2017-04-17 MED ORDER — FLUDEOXYGLUCOSE F - 18 (FDG) INJECTION
12.0000 | Freq: Once | INTRAVENOUS | Status: AC | PRN
  Administered 2017-04-17: 13.16 via INTRAVENOUS

## 2017-04-18 ENCOUNTER — Ambulatory Visit
Admission: RE | Admit: 2017-04-18 | Discharge: 2017-04-18 | Disposition: A | Discharge status: Home / Self Care | Payer: Medicare Other | Source: Ambulatory Visit | Attending: Obstetrics and Gynecology | Admitting: Obstetrics and Gynecology

## 2017-04-18 ENCOUNTER — Inpatient Hospital Stay (HOSPITAL_BASED_OUTPATIENT_CLINIC_OR_DEPARTMENT_OTHER): Payer: Medicare Other | Admitting: Obstetrics and Gynecology

## 2017-04-18 ENCOUNTER — Encounter: Payer: Self-pay | Admitting: Obstetrics and Gynecology

## 2017-04-18 VITALS — BP 122/74 | HR 94 | Temp 97.4°F | Resp 18 | Ht 59.0 in | Wt 149.1 lb

## 2017-04-18 DIAGNOSIS — C539 Malignant neoplasm of cervix uteri, unspecified: Secondary | ICD-10-CM | POA: Diagnosis not present

## 2017-04-18 DIAGNOSIS — Z96641 Presence of right artificial hip joint: Secondary | ICD-10-CM | POA: Insufficient documentation

## 2017-04-18 DIAGNOSIS — M5136 Other intervertebral disc degeneration, lumbar region: Secondary | ICD-10-CM | POA: Insufficient documentation

## 2017-04-18 DIAGNOSIS — M47896 Other spondylosis, lumbar region: Secondary | ICD-10-CM | POA: Insufficient documentation

## 2017-04-18 MED ORDER — GADOBENATE DIMEGLUMINE 529 MG/ML IV SOLN
15.0000 mL | Freq: Once | INTRAVENOUS | Status: AC | PRN
  Administered 2017-04-18: 14 mL via INTRAVENOUS

## 2017-04-18 NOTE — Progress Notes (Signed)
  Oncology Nurse Navigator Documentation Demographics sent to [facility] for scheduling with Dr. Theora Gianotti in clinic Navigator Location: [facility] (04/18/17 1100)   )Navigator Encounter Type: Follow-up Appt (04/18/17 1100)                     Patient Visit Type: GynOnc (04/18/17 1100)                              Time Spent with Patient: 30 (04/18/17 1100)

## 2017-04-18 NOTE — Progress Notes (Signed)
Gynecologic Oncology Consult Visit   Referring Provider: Gae Dry, MD  Chief Concern: cervical cancer  Subjective:  Kendra Herring is a 56 y.o. female who is seen in consultation from Dr. Kenton Kingfisher for new diagnosis of invasive cervical cancer.  Patient returns today to review PET scan result.  No new complaints.  Oncology history Kendra Herring is a pleasant postmenopausal [phone] who presented to Dr. Kenton Kingfisher evaluation and management of abnormal cervical cytology, AGUS, favor high risk cells.  Pt also had abnormal exam appearance of cervix on exam.  She first noted spotting and bleeding, which is post menopausal as ir has been > 1 year without a period, 2 mos ago.  Date of last PAP prior to this recent results is uncertain.   Colposcopy performed with 4% acetic acid as well as EMBx. Exam notable for the following: Aceto-white Lesions Location(s): throughout, abnormal appearance, contact bleeding               Part A: CERVIX, 6:00, BIOPSY:  INVASIVE SQUAMOUS CELL CARCINOMA.  Part B: CERVIX, 10:00, BIOPSY:  HIGH-GRADE SQUAMOUS INTRAEPITHELIAL LESION (CIN 3).  CHRONIC CERVICITIS WITH SQUAMOUS METAPLASIA.  Part C: CERVIX, 12:00, BIOPSY:  FOCAL HIGH-GRADE SQUAMOUS INTRAEPITHELIAL LESION (CIN 3).  CHRONIC CERVICITIS.  Part D: ENDOCERVIX, CURETTINGS:  INVASIVE SQUAMOUS CELL CARCINOMA.  FRAGMENTED BIOPSY SPECIMEN.  COMMENT: THIS CASE WAS REVIEWED IN INTRADEPARTMENTAL CONSULTATION AND THE  DIAGNOSIS REFLECTS OUR CONSENSUS OPINION. THE RESULTS WERE CALLED TO  DR.HARRIS'S OFFICE BY DR.QAYUMI AND GIVEN TO MS.KIM ON 04/02/2017.I  MQA/04/02/2017   ENDOMETRIUM, BIOPSY:  SCANT FRAGMENTS OF INACTIVE ENDOMETRIUM, MIXED WITH MINUTE FRAGMENTS OF  BENIGN ENDOCERVICAL GLANDS, MUCUS, AND BLOOD. NO HYPERPLASIA OR  CARCINOMA.   She was seen in the ER 03/28/2017 for back pain. Workup included   CT scan - dedicated renal protocol: 1. Negative for nephrolithiasis, hydronephrosis or ureteral  stone. 2. There are no acute abnormalities visualized.   Dr Theora Gianotti examined patient and felt it was difficult to feel the tumor and get a sense of its size because it is inside the canal.  And she was concerned about her back pain possibly being due to metastatic disease.   PET/CT 04/16/17 IMPRESSION:  I reviewed images with the radiologist on the phone. 1. Small focus of abnormal accentuated activity along the posterior cervix, potentially a site of focal neoplastic disease/malignancy. No pathologic adenopathy or evidence of metastatic spread. 2. Right total hip prosthesis noted with cerclage wires along the stem component. 3. Lumbar spondylosis and degenerative disc disease with potential impingement at L3-4 and L4-5.  She presents today for evaluation.   Problem List: Patient Active Problem List   Diagnosis Date Noted  . Cervical carcinoma ([place]) 04/06/2017    Past Medical History: Past Medical History:  Diagnosis Date  . Anxiety   . Arrhythmia   . Arthritis   . Cervical cancer ([place])   . Hernia of abdominal wall 04/17/2017    Past Surgical History: Past Surgical History:  Procedure Laterality Date  . HIP SURGERY    . TUBAL LIGATION      Past Gynecologic History:  Menarche: 15 Menstrual details: postmenopausal Menses regular: no Last Menstrual Period: a year ago History of Abnormal pap: yes, glandular cell abnormality (AGUS) Last pap: see HPI Contraception: bilateral tubal ligation   OB History:  OB History  Gravida Para Term Preterm AB Living  3 3 3     3   SAB TAB Ectopic Multiple Live Births               #  Outcome Date GA Lbr Len/2nd Weight Sex Delivery Anes PTL Lv  3 Term 08/05/89          2 Term 09/09/79          1 Term 10/16/69              Family History: Family History  Problem Relation Age of Onset  . Diabetes Mother     Social History: Social History   Social History  . Marital status: Divorced    Spouse name: N/A  . Number of  children: N/A  . Years of education: N/A   Occupational History  . Not on file.   Social History Main Topics  . Smoking status: Never Smoker  . Smokeless tobacco: Never Used  . Alcohol use No  . Drug use: No  . Sexual activity: Yes    Birth control/ protection: None   Other Topics Concern  . Not on file   Social History Narrative  . No narrative on file    Allergies: No Known Allergies  Current Medications: Current Outpatient Prescriptions  Medication Sig Dispense Refill  . ibuprofen (ADVIL,MOTRIN) 800 MG tablet Take 800 mg by mouth every 8 (eight) hours as needed.    . cephALEXin (KEFLEX) 500 MG capsule Take 500 mg by mouth 2 (two) times daily.     No current facility-administered medications for this visit.     Review of Systems General: negative for, fevers she has noted weight gain and night sweats Skin: negative for changes in color, texture, moles or lesions Eyes: negative for, changes in vision HEENT: negative for, change in hearing, voice changes Pulmonary: positive for SOB, productive cough Cardiac: negative for, palpitations, pain Gastrointestinal: negative for, nausea, vomiting, constipation, diarrhea, hematemesis, hematochezia Genitourinary/Sexual: negative for, dysuria, stones Ob/Gyn: irregular bleeding Musculoskeletal: pain back and legs Hematology: negative for, easy bruising Neurologic/Psych: positive for weakness and numbness and anxiety, negative for, headaches, seizures, paralysis  Objective:  Physical Examination:  BP 122/74   Pulse 94   Temp 97.4 F (36.3 C) (Oral)   Resp 18   Ht 4\' 11"  (1.499 m)   Wt 149 lb 1.6 oz (67.6 kg)   BMI 30.11 kg/m    ECOG Performance Status: 1 - Symptomatic but completely ambulatory  General appearance: alert, cooperative and appears stated age. She walks with support due to right hip issues HEENT:PERRLA, extra ocular movement intact, sclera clear, anicteric and neck supple with midline trachea Lymph node  survey: non-palpable, axillary, inguinal, supraclavicular Cardiovascular: regular rate and rhythm Respiratory: normal air entry, lungs clear to auscultation Abdomen: slightly distended, but soft, non-tender, without masses or organomegaly.  Possible umbilical hernia. Back: inspection of back is normal Extremities: edema 1 + symmetrical otherwise negative Skin exam - normal coloration and turgor, no rashes, no suspicious skin lesions noted. Neurological exam reveals alert, oriented, normal speech, no focal findings   Pelvic: exam chaperoned by nurse;  Vulva: normal appearing vulva with no masses, tenderness or lesions; Vagina: normal vagina; Adnexa: no masses; Uterus: uterus is not grossly enlarged and non-tender and deviated a bit; Cervix: multiparous appearance and grossly abnormal appearing which may be all related to cancer vs ectropion, the lesion appears to be extending to the posterior vagina. The cervix is firm to palpation; Rectal: confirmatory    Assessment:  Kendra Herring is a 56 y.o. 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 Concern for at least stage IB1 and possibly IB2 disease. Right back pain is  concerning for locally advanced disease, negative CT and PET scans reassuring.  Pain likely due to hip replacements and disc disease noted on scans.    Medical co-morbidities complicating care: history of cardiac arrythmia, s/p hip surgery with mobility issues, and arthritis.   Plan:   Problem List Items Addressed This Visit      Genitourinary   Cervical carcinoma ([place]) - Primary     We again discussed options for management and will do MRI today to better assess tumor volume/distribution and if this is reassuring she will be a good candidate for surgery. [facility] pathology review of the biopsies underway and if confirms more than 3 mm invasion she would be candidate for radical hysterectomy and SLN mapping and biopsies.  Primary chemoradiation is  also an option, but if she has stage I disease then hysterectomy probably preferable with respect to long term quality of life. We also discussed that if she does undergo surgery,  we may recommend adjuvant chemoradiation if she has concerning pathologic features.  If [facility] path review does not confirm > 3 mm invasion may need a cone biopsy prior to definitive treatment.    I believe [facility] path review will probably confirm > 3 mm invasion, so have scheduled patient to see Dr Theora Gianotti at [facility] next week for preop visit for radical hysterectomy.     Mellody Drown, MD  CC:  Gae Dry, MD

## 2017-04-18 NOTE — Progress Notes (Signed)
She is very anxious about today and if the pet scna will give them enough info to decide about surgery.

## 2017-04-26 ENCOUNTER — Encounter: Payer: Self-pay | Admitting: Radiation Oncology

## 2017-04-26 ENCOUNTER — Ambulatory Visit
Admission: RE | Admit: 2017-04-26 | Discharge: 2017-04-26 | Disposition: A | Discharge status: Home / Self Care | Payer: Medicare Other | Source: Ambulatory Visit | Attending: Radiation Oncology | Admitting: Radiation Oncology

## 2017-04-26 VITALS — BP 143/82 | HR 89 | Temp 95.7°F | Resp 20 | Wt 145.9 lb

## 2017-04-26 DIAGNOSIS — K439 Ventral hernia without obstruction or gangrene: Secondary | ICD-10-CM | POA: Insufficient documentation

## 2017-04-26 DIAGNOSIS — I499 Cardiac arrhythmia, unspecified: Secondary | ICD-10-CM | POA: Insufficient documentation

## 2017-04-26 DIAGNOSIS — C539 Malignant neoplasm of cervix uteri, unspecified: Secondary | ICD-10-CM | POA: Diagnosis present

## 2017-04-26 DIAGNOSIS — Z510 Encounter for antineoplastic radiation therapy: Secondary | ICD-10-CM | POA: Insufficient documentation

## 2017-04-26 DIAGNOSIS — Z79899 Other long term (current) drug therapy: Secondary | ICD-10-CM | POA: Insufficient documentation

## 2017-04-26 DIAGNOSIS — F419 Anxiety disorder, unspecified: Secondary | ICD-10-CM | POA: Diagnosis not present

## 2017-04-26 NOTE — Consult Note (Signed)
NEW PATIENT EVALUATION  Name: Kendra Herring  MRN: [id number]  Date:   04/26/2017     DOB: 12/03/1960   This 56 y.o. 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  REFERRING PHYSICIAN: Letta Median, MD  CHIEF COMPLAINT:  Chief Complaint  Patient presents with  . Cancer    Pt is here for initial consult of cervical cancer    DIAGNOSIS: The encounter diagnosis was Squamous cell carcinoma of cervix ([place]).   PREVIOUS INVESTIGATIONS:  MRI scans are reviewed Pathology reports reviewed Clinical notes reviewed  HPI: Patient is a pleasant 56 year old female who presented with an abnormal Pap smear and abnormal uterine bleeding. Colposcopy was performed showing invasive squamous cell carcinoma with positive endocervical curettage. Also on examination was question of posterior vaginal involvement. MRI scan showed a 1.9 cm posterior cervical mass no evidence of lymph node adenopathy. PET CT scan performed June 27 showed no evidence of distant disease. On PET scan was a small focus of abnormal activity along the posterior cervix potentially the site of neoplastic disease. Case was presented at [facility] to radiation oncology and thought not to be a surgical candidate based on the vaginal involvement. They recommended external beam radiation therapy to her pelvis followed by brachytherapy which would be performed at [facility]. She is seen today for consideration of external beam treatment at at our institution. She is doing well. She specifically denies diarrhea dysuria or any other GI/GU complaints. She states she is not having any vaginal bleeding or discharge at this time.  PLANNED TREATMENT REGIMEN: External beam radiation therapy with concurrent chemotherapy and brachytherapy  PAST MEDICAL HISTORY:  has a past medical history of Anxiety; Arrhythmia; Arthritis; Cervical cancer ([place]); and Hernia of abdominal wall (04/17/2017).     PAST SURGICAL HISTORY:  Past Surgical History:  Procedure Laterality Date  . HIP SURGERY    . TUBAL LIGATION      FAMILY HISTORY: family history includes Diabetes in her mother.  SOCIAL HISTORY:  reports that she has never smoked. She has never used smokeless tobacco. She reports that she does not drink alcohol or use drugs.  ALLERGIES: Patient has no known allergies.  MEDICATIONS:  Current Outpatient Prescriptions  Medication Sig Dispense Refill  . clonazePAM (KLONOPIN) 0.5 MG tablet take 1/2 to 1 tablet by mouth twice a day if needed FOR SEVERE ANXIETY    . busPIRone (BUSPAR) 15 MG tablet buspirone 15 mg tablet    . cephALEXin (KEFLEX) 500 MG capsule Take 500 mg by mouth 2 (two) times daily.    . citalopram (CELEXA) 10 MG tablet     . ibuprofen (ADVIL,MOTRIN) 800 MG tablet Take 800 mg by mouth every 8 (eight) hours as needed.     No current facility-administered medications for this encounter.     ECOG PERFORMANCE STATUS:  0 - Asymptomatic  REVIEW OF SYSTEMS:  Patient denies any weight loss, fatigue, weakness, fever, chills or night sweats. Patient denies any loss of vision, blurred vision. Patient denies any ringing  of the ears or hearing loss. No irregular heartbeat. Patient denies heart murmur or history of fainting. Patient denies any chest pain or pain radiating to her upper extremities. Patient denies any shortness of breath, difficulty breathing at night, cough or hemoptysis. Patient denies any swelling in the lower legs. Patient denies any nausea vomiting, vomiting of blood, or coffee ground material in the vomitus. Patient denies any stomach pain. Patient  states has had normal bowel movements no significant constipation or diarrhea. Patient denies any dysuria, hematuria or significant nocturia. Patient denies any problems walking, swelling in the joints or loss of balance. Patient denies any skin changes, loss of hair or loss of weight. Patient denies any excessive  worrying or anxiety or significant depression. Patient denies any problems with insomnia. Patient denies excessive thirst, polyuria, polydipsia. Patient denies any swollen glands, patient denies easy bruising or easy bleeding. Patient denies any recent infections, allergies or URI. Patient "s visual fields have not changed significantly in recent time.    PHYSICAL EXAM: BP (!) 143/82   Pulse 89   Temp (!) 95.7 F (35.4 C)   Resp 20   Wt 145 lb 15.1 oz (66.2 kg)   BMI 29.48 kg/m  Patient does use a cane for assistance. She does have hip replacement.Well-developed well-nourished patient in NAD. HEENT reveals PERLA, EOMI, discs not visualized.  Oral cavity is clear. No oral mucosal lesions are identified. Neck is clear without evidence of cervical or supraclavicular adenopathy. Lungs are clear to A&P. Cardiac examination is essentially unremarkable with regular rate and rhythm without murmur rub or thrill. Abdomen is benign with no organomegaly or masses noted. Motor sensory and DTR levels are equal and symmetric in the upper and lower extremities. Cranial nerves II through XII are grossly intact. Proprioception is intact. No peripheral adenopathy or edema is identified. No motor or sensory levels are noted. Crude visual fields are within normal range.  LABORATORY DATA: Pathology reports reviewed    RADIOLOGY RESULTS: MRI scan and PET/CT reviewed   IMPRESSION: Stage IIa squamous cell carcinoma the cervix in 56 year old female  PLAN: At this time I agree with the [facility] radiation oncology department assessment. I would plan on delivering 4500 cGy to her whole pelvis using external beam treatment. This will be followed by brachytherapy performed at [facility]. Risks and benefits of treatment including increased lower urinary tract symptoms diarrhea fatigue alteration of blood counts skin reaction all were discussed in detail with the patient. We will have her seen back at [facility] 1 week prior treatment  completion of external beam treatment to provide for seamless coverage into her brachytherapy treatments. I personally set up and ordered CT simulation for early next week. Patient knows to call with any concerns at any time.  I would like to take this opportunity to thank you for allowing me to participate in the care of your patient.Armstead Peaks., MD

## 2017-04-27 ENCOUNTER — Other Ambulatory Visit: Payer: Self-pay | Admitting: Oncology

## 2017-04-27 NOTE — Progress Notes (Signed)
[place]  Telephone:([phone]) [phone] Fax:(336) [phone]  ID: Kendra Herring OB: 1961-04-07  MR#: [id number]  FXT#:[id number]  Patient Care Team: Letta Median, MD as PCP - General (Family Medicine) Clent Jacks, RN as Registered Nurse  CHIEF COMPLAINT: 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   INTERVAL HISTORY: Patient is a 56 year old female who noted some vaginal bleeding approximately 1 year ago, but due to personal and family issues did not have her symptoms addressed until recently. Subsequent workup including biopsy and PET scan revealed the above stated cervical cancer. Patient has been evaluated by radiation oncology as well as gynecology oncology and determined not to be a surgical candidate given the proximity to the vaginal wall. She is highly anxious, but otherwise feels well. She has no neurologic complaints. She denies any recent fevers or illnesses. She has a good appetite and denies weight loss. She denies any chest pain or shortness of breath. She has no nausea, vomiting, constipation, or diarrhea. She denies any melena or hematochezia. She has no urinary complaints. Patient otherwise feels well and offers no further specific complaints.  REVIEW OF SYSTEMS:   Review of Systems  Constitutional: Negative for fever, malaise/fatigue and weight loss.  Eyes: Negative.   Respiratory: Negative.  Negative for cough and shortness of breath.   Cardiovascular: Negative.  Negative for chest pain and leg swelling.  Gastrointestinal: Negative.  Negative for abdominal pain, blood in stool and melena.  Genitourinary: Negative.  Negative for hematuria.  Musculoskeletal: Negative.   Skin: Negative.  Negative for rash.  Neurological: Negative.  Negative for sensory change and weakness.  Psychiatric/Behavioral: The patient is nervous/anxious and has insomnia.     As per HPI. Otherwise, a complete review of systems is negative.  PAST MEDICAL  HISTORY: Past Medical History:  Diagnosis Date  . Anxiety   . Arrhythmia   . Arthritis   . Cervical cancer ([place])   . Hernia of abdominal wall 04/17/2017    PAST SURGICAL HISTORY: Past Surgical History:  Procedure Laterality Date  . HIP SURGERY    . TUBAL LIGATION      FAMILY HISTORY: Family History  Problem Relation Age of Onset  . Diabetes Mother     ADVANCED DIRECTIVES (Y/N):  N  HEALTH MAINTENANCE: Social History  Substance Use Topics  . Smoking status: Never Smoker  . Smokeless tobacco: Never Used  . Alcohol use No     Colonoscopy:  PAP:  Bone density:  Lipid panel:  No Known Allergies  Current Outpatient Prescriptions  Medication Sig Dispense Refill  . clonazePAM (KLONOPIN) 0.5 MG tablet take 1/2 to 1 tablet by mouth twice a day if needed FOR SEVERE ANXIETY    . busPIRone (BUSPAR) 15 MG tablet buspirone 15 mg tablet    . cephALEXin (KEFLEX) 500 MG capsule Take 500 mg by mouth 2 (two) times daily.    . citalopram (CELEXA) 10 MG tablet      No current facility-administered medications for this visit.     OBJECTIVE: Vitals:   04/30/17 1434  BP: 138/86  Pulse: 88  Temp: 97.9 F (36.6 C)     Body mass index is 30.72 kg/m.    ECOG FS:0 - Asymptomatic  General: Well-developed, well-nourished, no acute distress. Eyes: Pink conjunctiva, anicteric sclera. HEENT: Normocephalic, moist mucous membranes, clear oropharnyx. Lungs: Clear to auscultation bilaterally. Heart: Regular rate and rhythm. No rubs, murmurs, or gallops. Abdomen: Soft, nontender, nondistended. No organomegaly noted, normoactive bowel sounds. Gyn:  Exam recently performed by gynecology oncology. Musculoskeletal: No edema, cyanosis, or clubbing. Neuro: Alert, answering all questions appropriately. Cranial nerves grossly intact. Skin: No rashes or petechiae noted. Psych: Normal affect. Lymphatics: No cervical, calvicular, axillary or inguinal LAD.   LAB RESULTS:  Lab Results   Component Value Date   NA 138 04/12/2014   K 3.8 04/12/2014   CL 106 04/12/2014   CO2 25 04/12/2014   GLUCOSE 170 (H) 04/12/2014   BUN 13 04/12/2014   CREATININE 0.68 04/12/2014   CALCIUM 8.9 04/12/2014   GFRNONAA >60 04/12/2014   GFRAA >60 04/12/2014    Lab Results  Component Value Date   WBC 10.5 04/12/2014   HGB 13.2 04/12/2014   HCT 40.5 04/12/2014   MCV 93 04/12/2014   PLT 228 04/12/2014     STUDIES: Mr Pelvis W Wo Contrast  Result Date: 04/18/2017 CLINICAL DATA:  Newly diagnosed cervical carcinoma. EXAM: MRI PELVIS WITHOUT AND WITH CONTRAST TECHNIQUE: Multiplanar multisequence MR imaging of the pelvis was performed both before and after administration of intravenous contrast. CONTRAST:  14 mL MultiHance COMPARISON:  PET-CT on 04/17/2017 FINDINGS: Urinary Tract: Unremarkable urinary bladder. Bowel: Unremarkable pelvic bowel loops. Vascular/Lymphatic:  No pathologically enlarged lymph nodes. Reproductive: Uterus: Image degradation by artifact from right hip prosthesis noted. Measures 6.8 x 3.9 x 4.5 cm (volume = 62 cm^3). No fibroids identified. A subtle ill-defined mass with mild hypervascularity and T2 hypointensity is seen in the posterior wall of the cervix which measures approximately 1.8 x 1.9 cm on image 10/series 5. This corresponds to focal site of hypermetabolic activity on recent PET scan, consistent with known cervical carcinoma. No evidence of extension into the inferior vagina, or invasion of parametrial soft tissues. Right ovary: Appears normal.  No mass identified. Left ovary:  Appears normal.  No mass identified. Other: No abnormal free fluid. Musculoskeletal:  Unremarkable. IMPRESSION: Image degradation by artifact from right hip prosthesis noted. Subtle ill-defined 1.9 cm mass in the posterior wall of the cervix, consistent with known cervical carcinoma. No evidence of extra-cervical tumor extension or pelvic lymphadenopathy. Electronically Signed   By: Earle Gell  M.D.   On: 04/18/2017 14:16   Nm Pet Image Initial (pi) Skull Base To Thigh  Result Date: 04/17/2017 CLINICAL DATA:  Initial treatment strategy for cervical carcinoma. EXAM: NUCLEAR MEDICINE PET SKULL BASE TO THIGH TECHNIQUE: 13.2 mCi F-18 FDG was injected intravenously. Full-ring PET imaging was performed from the skull base to thigh after the radiotracer. CT data was obtained and used for attenuation correction and anatomic localization. FASTING BLOOD GLUCOSE:  Value: 125 mg/dl COMPARISON:  CT scan 03/28/2017 FINDINGS: NECK No hypermetabolic lymph nodes in the neck. Upper normal size level 2 cervical lymph nodes on the left are not hypermetabolic. CHEST No hypermetabolic mediastinal or hilar nodes. No suspicious pulmonary nodules on the CT scan. ABDOMEN/PELVIS There is a focus of activity posteriorly along the cervix with maximum SUV 6.4, favoring focal malignancy. No hypermetabolic adenopathy in the abdomen or pelvis. No evidence of distant spread. A 0.8 cm right pelvic sidewall lymph node on image 207/3 has a fatty hilum and is not hypermetabolic. SKELETON Right total hip prosthesis noted with proximal cerclage wires along the stem component. No findings of osseous metastatic disease. There is evidence of spondylosis and degenerative disc disease with potential impingement at the L3-4 and L4-5 levels. IMPRESSION: 1. Small focus of abnormal accentuated activity along the posterior cervix, potentially a site of focal neoplastic disease/malignancy. No pathologic adenopathy or evidence  of metastatic spread. 2. Right total hip prosthesis noted with cerclage wires along the stem component. 3. Lumbar spondylosis and degenerative disc disease with potential impingement at L3-4 and L4-5. Electronically Signed   By: Van Clines M.D.   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  PLAN:    1. Stage IIa squamous cell carcinoma of the cervix: Biopsy and PET scan  results reviewed independently confirming stage of disease. Patient previously was determined not to be a surgical candidate given the location of her malignancy. She has been evaluated by radiation oncology with plans of simulation tomorrow and starting XRT next week. Patient will benefit from concurrent chemotherapy using weekly cisplatin. At the conclusion of her radiation and chemotherapy, patient will go to [facility] to receive brachytherapy. Patient will return to clinic on Wednesday, May 09, 2017 to initiate cycle 1 of weekly cisplatin.  Approximately 60 minutes was spent in discussion of which greater than 50% was consultation.  Patient expressed understanding and was in agreement with this plan. She also understands that She can call clinic at any time with any questions, concerns, or complaints.   Cancer Staging Cervical carcinoma [facility]) Staging form: Cervix Uteri, AJCC 8th Edition - Clinical stage from 04/27/2017: FIGO Stage IIA (cT2a, cN0, cM0) - Signed by Lloyd Huger, MD on 04/27/2017   Lloyd Huger, MD   04/30/2017 3:26 PM

## 2017-04-30 ENCOUNTER — Encounter: Payer: Self-pay | Admitting: Oncology

## 2017-04-30 ENCOUNTER — Encounter (INDEPENDENT_AMBULATORY_CARE_PROVIDER_SITE_OTHER): Payer: Self-pay

## 2017-04-30 ENCOUNTER — Inpatient Hospital Stay: Payer: Medicare Other | Attending: Oncology | Admitting: Oncology

## 2017-04-30 VITALS — BP 138/86 | HR 88 | Temp 97.9°F | Wt 152.1 lb

## 2017-04-30 DIAGNOSIS — R634 Abnormal weight loss: Secondary | ICD-10-CM | POA: Diagnosis not present

## 2017-04-30 DIAGNOSIS — Z96641 Presence of right artificial hip joint: Secondary | ICD-10-CM | POA: Insufficient documentation

## 2017-04-30 DIAGNOSIS — M1990 Unspecified osteoarthritis, unspecified site: Secondary | ICD-10-CM | POA: Diagnosis not present

## 2017-04-30 DIAGNOSIS — R630 Anorexia: Secondary | ICD-10-CM | POA: Diagnosis not present

## 2017-04-30 DIAGNOSIS — C539 Malignant neoplasm of cervix uteri, unspecified: Secondary | ICD-10-CM

## 2017-04-30 DIAGNOSIS — F419 Anxiety disorder, unspecified: Secondary | ICD-10-CM | POA: Diagnosis not present

## 2017-04-30 DIAGNOSIS — Z79899 Other long term (current) drug therapy: Secondary | ICD-10-CM | POA: Insufficient documentation

## 2017-04-30 DIAGNOSIS — Z5111 Encounter for antineoplastic chemotherapy: Secondary | ICD-10-CM | POA: Diagnosis not present

## 2017-04-30 DIAGNOSIS — R682 Dry mouth, unspecified: Secondary | ICD-10-CM | POA: Diagnosis not present

## 2017-04-30 MED ORDER — PROCHLORPERAZINE MALEATE 10 MG PO TABS: 10.0000 mg | ORAL_TABLET | Freq: Four times a day (QID) | ORAL | 2 refills | Status: DC | PRN

## 2017-04-30 MED ORDER — ONDANSETRON HCL 8 MG PO TABS: 8.0000 mg | ORAL_TABLET | Freq: Two times a day (BID) | ORAL | 2 refills | Status: DC | PRN

## 2017-04-30 NOTE — Progress Notes (Signed)
Uterine - No Medical Intervention - Off Treatment.  Patient Characteristics: Endometrioid Histology, First Line - Newly Diagnosed, Medically Inoperable, Clinical Stage II AJCC T Category: TX AJCC N Category: NX AJCC M Category: M0 AJCC 8 Stage Grouping: II Line of therapy: First Line - Newly Diagnosed Would you be surprised if this patient died  in the next year? I would be surprised if this patient died in the next year Patient Status: Medically Inoperable

## 2017-05-01 ENCOUNTER — Ambulatory Visit
Admission: RE | Admit: 2017-05-01 | Discharge: 2017-05-01 | Disposition: A | Discharge status: Home / Self Care | Payer: Medicare Other | Source: Ambulatory Visit | Attending: Radiation Oncology | Admitting: Radiation Oncology

## 2017-05-01 ENCOUNTER — Other Ambulatory Visit: Payer: Self-pay

## 2017-05-01 DIAGNOSIS — C539 Malignant neoplasm of cervix uteri, unspecified: Secondary | ICD-10-CM | POA: Diagnosis not present

## 2017-05-02 NOTE — Patient Instructions (Signed)
Cisplatin injection  What is this medicine?  CISPLATIN (SIS pla tin) is a chemotherapy drug. It targets fast dividing cells, like cancer cells, and causes these cells to die. This medicine is used to treat many types of cancer like bladder, ovarian, and testicular cancers.  This medicine may be used for other purposes; ask your health care provider or pharmacist if you have questions.  COMMON BRAND NAME(S): Platinol, Platinol -AQ  What should I tell my health care provider before I take this medicine?  They need to know if you have any of these conditions:  -blood disorders  -hearing problems  -kidney disease  -recent or ongoing radiation therapy  -an unusual or allergic reaction to cisplatin, carboplatin, other chemotherapy, other medicines, foods, dyes, or preservatives  -pregnant or trying to get pregnant  -breast-feeding  How should I use this medicine?  This drug is given as an infusion into a vein. It is administered in a hospital or clinic by a specially trained health care professional.  Talk to your pediatrician regarding the use of this medicine in children. Special care may be needed.  Overdosage: If you think you have taken too much of this medicine contact a poison control center or emergency room at once.  NOTE: This medicine is only for you. Do not share this medicine with others.  What if I miss a dose?  It is important not to miss a dose. Call your doctor or health care professional if you are unable to keep an appointment.  What may interact with this medicine?  -dofetilide  -foscarnet  -medicines for seizures  -medicines to increase blood counts like filgrastim, pegfilgrastim, sargramostim  -probenecid  -pyridoxine used with altretamine  -rituximab  -some antibiotics like amikacin, gentamicin, neomycin, polymyxin B, streptomycin, tobramycin  -sulfinpyrazone  -vaccines  -zalcitabine  Talk to your doctor or health care professional before taking any of these  medicines:  -acetaminophen  -aspirin  -ibuprofen  -ketoprofen  -naproxen  This list may not describe all possible interactions. Give your health care provider a list of all the medicines, herbs, non-prescription drugs, or dietary supplements you use. Also tell them if you smoke, drink alcohol, or use illegal drugs. Some items may interact with your medicine.  What should I watch for while using this medicine?  Your condition will be monitored carefully while you are receiving this medicine. You will need important blood work done while you are taking this medicine.  This drug may make you feel generally unwell. This is not uncommon, as chemotherapy can affect healthy cells as well as cancer cells. Report any side effects. Continue your course of treatment even though you feel ill unless your doctor tells you to stop.  In some cases, you may be given additional medicines to help with side effects. Follow all directions for their use.  Call your doctor or health care professional for advice if you get a fever, chills or sore throat, or other symptoms of a cold or flu. Do not treat yourself. This drug decreases your body's ability to fight infections. Try to avoid being around people who are sick.  This medicine may increase your risk to bruise or bleed. Call your doctor or health care professional if you notice any unusual bleeding.  Be careful brushing and flossing your teeth or using a toothpick because you may get an infection or bleed more easily. If you have any dental work done, tell your dentist you are receiving this medicine.  Avoid taking products   that contain aspirin, acetaminophen, ibuprofen, naproxen, or ketoprofen unless instructed by your doctor. These medicines may hide a fever.  Do not become pregnant while taking this medicine. Women should inform their doctor if they wish to become pregnant or think they might be pregnant. There is a potential for serious side effects to an unborn child. Talk to  your health care professional or pharmacist for more information. Do not breast-feed an infant while taking this medicine.  Drink fluids as directed while you are taking this medicine. This will help protect your kidneys.  Call your doctor or health care professional if you get diarrhea. Do not treat yourself.  What side effects may I notice from receiving this medicine?  Side effects that you should report to your doctor or health care professional as soon as possible:  -allergic reactions like skin rash, itching or hives, swelling of the face, lips, or tongue  -signs of infection - fever or chills, cough, sore throat, pain or difficulty passing urine  -signs of decreased platelets or bleeding - bruising, pinpoint red spots on the skin, black, tarry stools, nosebleeds  -signs of decreased red blood cells - unusually weak or tired, fainting spells, lightheadedness  -breathing problems  -changes in hearing  -gout pain  -low blood counts - This drug may decrease the number of white blood cells, red blood cells and platelets. You may be at increased risk for infections and bleeding.  -nausea and vomiting  -pain, swelling, redness or irritation at the injection site  -pain, tingling, numbness in the hands or feet  -problems with balance, movement  -trouble passing urine or change in the amount of urine  Side effects that usually do not require medical attention (report to your doctor or health care professional if they continue or are bothersome):  -changes in vision  -loss of appetite  -metallic taste in the mouth or changes in taste  This list may not describe all possible side effects. Call your doctor for medical advice about side effects. You may report side effects to FDA at 1-800-FDA-1088.  Where should I keep my medicine?  This drug is given in a hospital or clinic and will not be stored at home.  NOTE: This sheet is a summary. It may not cover all possible information. If you have questions about this medicine,  talk to your doctor, pharmacist, or health care provider.   2018 Elsevier/Gold Standard (2008-01-14 14:40:54)

## 2017-05-03 ENCOUNTER — Inpatient Hospital Stay: Payer: Medicare Other

## 2017-05-03 DIAGNOSIS — C539 Malignant neoplasm of cervix uteri, unspecified: Secondary | ICD-10-CM | POA: Diagnosis not present

## 2017-05-04 ENCOUNTER — Encounter (HOSPITAL_COMMUNITY): Payer: Self-pay

## 2017-05-07 NOTE — Progress Notes (Signed)
Social worker met with patient at the cancer center to provide counseling. 

## 2017-05-08 ENCOUNTER — Ambulatory Visit
Admission: RE | Admit: 2017-05-08 | Discharge: 2017-05-08 | Disposition: A | Discharge status: Home / Self Care | Payer: Medicare Other | Source: Ambulatory Visit | Attending: Radiation Oncology | Admitting: Radiation Oncology

## 2017-05-08 DIAGNOSIS — C539 Malignant neoplasm of cervix uteri, unspecified: Secondary | ICD-10-CM | POA: Diagnosis not present

## 2017-05-08 NOTE — Progress Notes (Signed)
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   INTERVAL HISTORY: Patient returns to clinic today for further evaluation and consideration of cycle 1 of weekly cisplatin. She is anxious, but otherwise feels well. She has no neurologic complaints. She denies any recent fevers or illnesses. She has a good appetite and denies weight loss. She denies any chest pain or shortness of breath. She has no nausea, vomiting, constipation, or diarrhea. She denies any melena or hematochezia. She has no urinary complaints. Patient offers no further specific complaints today.  REVIEW OF SYSTEMS:   Review of Systems  Constitutional: Negative for fever, malaise/fatigue and weight loss.  Eyes: Negative.   Respiratory: Negative.  Negative for cough and shortness of breath.   Cardiovascular: Negative.  Negative for chest pain and leg swelling.  Gastrointestinal: Negative.  Negative for abdominal pain, blood in stool and melena.  Genitourinary: Negative.  Negative for hematuria.  Musculoskeletal: Negative.   Skin: Negative.  Negative for rash.  Neurological: Negative.  Negative for sensory change and weakness.  Psychiatric/Behavioral: The patient is nervous/anxious and has insomnia.     As per HPI. Otherwise, a complete review of systems is negative.  PAST MEDICAL HISTORY: Past Medical History:  Diagnosis Date  . Anxiety   . Arrhythmia   . Arthritis   . Cervical cancer ([place])   . Hernia of abdominal wall 04/17/2017    PAST SURGICAL HISTORY: Past Surgical History:  Procedure Laterality Date  . HIP SURGERY    . TUBAL LIGATION      FAMILY HISTORY: Family History  Problem Relation Age of Onset  . Diabetes Mother      ADVANCED DIRECTIVES (Y/N):  N  HEALTH MAINTENANCE: Social History  Substance Use Topics  . Smoking status: Never Smoker  . Smokeless tobacco: Never Used  . Alcohol use No     Colonoscopy:  PAP:  Bone density:  Lipid panel:  No Known Allergies  Current Outpatient Prescriptions  Medication Sig Dispense Refill  . busPIRone (BUSPAR) 15 MG tablet buspirone 15 mg tablet    . citalopram (CELEXA) 10 MG tablet     . clonazePAM (KLONOPIN) 0.5 MG tablet take 1/2 to 1 tablet by mouth twice a day if needed FOR SEVERE ANXIETY    . cephALEXin (KEFLEX) 500 MG capsule Take 500 mg by mouth 2 (two) times daily.    . ondansetron (ZOFRAN) 8 MG tablet Take 1 tablet (8 mg total) by mouth 2 (two) times daily as needed. (Patient not taking: Reported on 05/09/2017) 60 tablet 2  . prochlorperazine (COMPAZINE) 10 MG tablet Take 1 tablet (10 mg total) by mouth every 6 (six) hours as needed (Nausea or vomiting). (Patient not taking: Reported on 05/09/2017) 60 tablet 2   No current facility-administered medications for this visit.    Facility-Administered Medications Ordered in Other Visits  Medication Dose Route Frequency Provider Last Rate Last Dose  . CISplatin (PLATINOL) 66 mg in sodium chloride 0.9 % 250 mL chemo infusion  40 mg/m2 (Treatment Plan Recorded) Intravenous Once Lloyd Huger, MD      . fosaprepitant (EMEND) 150 mg, dexamethasone (DECADRON) 12 mg in sodium chloride 0.9 % 145 mL IVPB   Intravenous Once Lloyd Huger, MD      .  palonosetron (ALOXI) injection 0.25 mg  0.25 mg Intravenous Once Lloyd Huger, MD        OBJECTIVE: Vitals:   05/09/17 0901  BP: 128/85  Pulse: 91  Resp: 18  Temp: 98.5 F (36.9 C)     Body mass index is 30.58 kg/m.    ECOG FS:0 - Asymptomatic  General: Well-developed, well-nourished, no acute distress. Eyes: Pink conjunctiva, anicteric sclera. Lungs: Clear to auscultation bilaterally. Heart: Regular rate and rhythm. No rubs, murmurs,  or gallops. Abdomen: Soft, nontender, nondistended. No organomegaly noted, normoactive bowel sounds. Gyn: Exam recently performed by gynecology oncology. Musculoskeletal: No edema, cyanosis, or clubbing. Neuro: Alert, answering all questions appropriately. Cranial nerves grossly intact. Skin: No rashes or petechiae noted. Psych: Normal affect.  LAB RESULTS:  Lab Results  Component Value Date   NA 141 05/09/2017   K 3.4 (L) 05/09/2017   CL 107 05/09/2017   CO2 28 05/09/2017   GLUCOSE 136 (H) 05/09/2017   BUN 12 05/09/2017   CREATININE 0.53 05/09/2017   CALCIUM 9.8 05/09/2017   GFRNONAA >60 05/09/2017   GFRAA >60 05/09/2017    Lab Results  Component Value Date   WBC 6.2 05/09/2017   NEUTROABS 4.1 05/09/2017   HGB 14.7 05/09/2017   HCT 41.3 05/09/2017   MCV 87.8 05/09/2017   PLT 250 05/09/2017     STUDIES: Mr Pelvis W Wo Contrast  Result Date: 04/18/2017 CLINICAL DATA:  Newly diagnosed cervical carcinoma. EXAM: MRI PELVIS WITHOUT AND WITH CONTRAST TECHNIQUE: Multiplanar multisequence MR imaging of the pelvis was performed both before and after administration of intravenous contrast. CONTRAST:  14 mL MultiHance COMPARISON:  PET-CT on 04/17/2017 FINDINGS: Urinary Tract: Unremarkable urinary bladder. Bowel: Unremarkable pelvic bowel loops. Vascular/Lymphatic:  No pathologically enlarged lymph nodes. Reproductive: Uterus: Image degradation by artifact from right hip prosthesis noted. Measures 6.8 x 3.9 x 4.5 cm (volume = 62 cm^3). No fibroids identified. A subtle ill-defined mass with mild hypervascularity and T2 hypointensity is seen in the posterior wall of the cervix which measures approximately 1.8 x 1.9 cm on image 10/series 5. This corresponds to focal site of hypermetabolic activity on recent PET scan, consistent with known cervical carcinoma. No evidence of extension into the inferior vagina, or invasion of parametrial soft tissues. Right ovary: Appears normal.  No mass  identified. Left ovary:  Appears normal.  No mass identified. Other: No abnormal free fluid. Musculoskeletal:  Unremarkable. IMPRESSION: Image degradation by artifact from right hip prosthesis noted. Subtle ill-defined 1.9 cm mass in the posterior wall of the cervix, consistent with known cervical carcinoma. No evidence of extra-cervical tumor extension or pelvic lymphadenopathy. Electronically Signed   By: Earle Gell M.D.   On: 04/18/2017 14:16   Nm Pet Image Initial (pi) Skull Base To Thigh  Result Date: 04/17/2017 CLINICAL DATA:  Initial treatment strategy for cervical carcinoma. EXAM: NUCLEAR MEDICINE PET SKULL BASE TO THIGH TECHNIQUE: 13.2 mCi F-18 FDG was injected intravenously. Full-ring PET imaging was performed from the skull base to thigh after the radiotracer. CT data was obtained and used for attenuation correction and anatomic localization. FASTING BLOOD GLUCOSE:  Value: 125 mg/dl COMPARISON:  CT scan 03/28/2017 FINDINGS: NECK No hypermetabolic lymph nodes in the neck. Upper normal size level 2 cervical lymph nodes on the left are not hypermetabolic. CHEST No hypermetabolic mediastinal or hilar nodes. No suspicious pulmonary nodules on the CT scan. ABDOMEN/PELVIS There is a focus of activity posteriorly along the cervix with maximum SUV 6.4, favoring focal malignancy.  No hypermetabolic adenopathy in the abdomen or pelvis. No evidence of distant spread. A 0.8 cm right pelvic sidewall lymph node on image 207/3 has a fatty hilum and is not hypermetabolic. SKELETON Right total hip prosthesis noted with proximal cerclage wires along the stem component. No findings of osseous metastatic disease. There is evidence of spondylosis and degenerative disc disease with potential impingement at the L3-4 and L4-5 levels. IMPRESSION: 1. Small focus of abnormal accentuated activity along the posterior cervix, potentially a site of focal neoplastic disease/malignancy. No pathologic adenopathy or evidence of  metastatic spread. 2. Right total hip prosthesis noted with cerclage wires along the stem component. 3. Lumbar spondylosis and degenerative disc disease with potential impingement at L3-4 and L4-5. Electronically Signed   By: Van Clines M.D.   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  PLAN:    1. 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 Patient previously was determined not to be a surgical candidate given the location of her malignancy. Patient initiated daily XRT earlier this week. She will benefit from concurrent chemotherapy using weekly cisplatin. At the conclusion of her radiation and chemotherapy, patient will go to [facility] to receive brachytherapy. Proceed with cycle 1 of weekly cisplatin. Return to clinic in 1 week for consideration of cycle 2.   Approximately 30 minutes was spent in discussion of which greater than 50% was consultation.  Patient expressed understanding and was in agreement with this plan. She also understands that She can call clinic at any time with any questions, concerns, or complaints.   Cancer Staging Cervical carcinoma [facility]) Staging form: Cervix Uteri, AJCC 8th Edition - Clinical stage from 04/27/2017: FIGO Stage IIA (cT2a, cN0, cM0) - Signed by Lloyd Huger, MD on 04/27/2017   Lloyd Huger, MD   05/09/2017 12:43 PM

## 2017-05-09 ENCOUNTER — Inpatient Hospital Stay (HOSPITAL_BASED_OUTPATIENT_CLINIC_OR_DEPARTMENT_OTHER): Payer: Medicare Other | Admitting: Oncology

## 2017-05-09 ENCOUNTER — Inpatient Hospital Stay: Payer: Medicare Other

## 2017-05-09 ENCOUNTER — Ambulatory Visit
Admission: RE | Admit: 2017-05-09 | Discharge: 2017-05-09 | Disposition: A | Discharge status: Home / Self Care | Payer: Medicare Other | Source: Ambulatory Visit | Attending: Radiation Oncology | Admitting: Radiation Oncology

## 2017-05-09 VITALS — BP 128/85 | HR 91 | Temp 98.5°F | Resp 18 | Wt 151.4 lb

## 2017-05-09 DIAGNOSIS — Z79899 Other long term (current) drug therapy: Secondary | ICD-10-CM | POA: Diagnosis not present

## 2017-05-09 DIAGNOSIS — C539 Malignant neoplasm of cervix uteri, unspecified: Secondary | ICD-10-CM

## 2017-05-09 DIAGNOSIS — Z5111 Encounter for antineoplastic chemotherapy: Secondary | ICD-10-CM | POA: Diagnosis not present

## 2017-05-09 LAB — CBC WITH DIFFERENTIAL/PLATELET
Basophils Absolute: 0 10*3/uL (ref 0–0.1)
Basophils Relative: 0 %
Eosinophils Absolute: 0.1 10*3/uL (ref 0–0.7)
Eosinophils Relative: 2 %
HCT: 41.3 % (ref 35.0–47.0)
Hemoglobin: 14.7 g/dL (ref 12.0–16.0)
Lymphocytes Relative: 26 %
Lymphs Abs: 1.6 10*3/uL (ref 1.0–3.6)
MCH: 31.2 pg (ref 26.0–34.0)
MCHC: 35.5 g/dL (ref 32.0–36.0)
MCV: 87.8 fL (ref 80.0–100.0)
Monocytes Absolute: 0.4 10*3/uL (ref 0.2–0.9)
Monocytes Relative: 6 %
Neutro Abs: 4.1 10*3/uL (ref 1.4–6.5)
Neutrophils Relative %: 66 %
Platelets: 250 10*3/uL (ref 150–440)
RBC: 4.7 MIL/uL (ref 3.80–5.20)
RDW: 12.1 % (ref 11.5–14.5)
WBC: 6.2 10*3/uL (ref 3.6–11.0)

## 2017-05-09 LAB — BASIC METABOLIC PANEL
Anion gap: 6 (ref 5–15)
BUN: 12 mg/dL (ref 6–20)
CO2: 28 mmol/L (ref 22–32)
Calcium: 9.8 mg/dL (ref 8.9–10.3)
Chloride: 107 mmol/L (ref 101–111)
Creatinine, Ser: 0.53 mg/dL (ref 0.44–1.00)
GFR calc Af Amer: >60 mL/min (ref >60)
GFR calc non Af Amer: >60 mL/min (ref >60)
Glucose, Bld: 136 mg/dL — ABNORMAL HIGH (ref 65–99)
Potassium: 3.4 mmol/L — ABNORMAL LOW (ref 3.5–5.1)
Sodium: 141 mmol/L (ref 135–145)

## 2017-05-09 MED ORDER — SODIUM CHLORIDE 0.9 % IV SOLN
Freq: Once | INTRAVENOUS | Status: AC
  Administered 2017-05-09: 13:00:00 via INTRAVENOUS
  Filled 2017-05-09: qty 5

## 2017-05-09 MED ORDER — PALONOSETRON HCL INJECTION 0.25 MG/5ML
0.2500 mg | Freq: Once | INTRAVENOUS | Status: AC
  Administered 2017-05-09: 0.25 mg via INTRAVENOUS
  Filled 2017-05-09: qty 5

## 2017-05-09 MED ORDER — SODIUM CHLORIDE 0.9 % IV SOLN
Freq: Once | INTRAVENOUS | Status: AC
Start: 2017-05-09 — End: 2017-05-09
  Administered 2017-05-09: 11:00:00 via INTRAVENOUS
  Filled 2017-05-09: qty 1000

## 2017-05-09 MED ORDER — POTASSIUM CHLORIDE 2 MEQ/ML IV SOLN
Freq: Once | INTRAVENOUS | Status: AC
  Administered 2017-05-09: 11:00:00 via INTRAVENOUS
  Filled 2017-05-09: qty 1000

## 2017-05-09 MED ORDER — SODIUM CHLORIDE 0.9 % IV SOLN
40.0000 mg/m2 | Freq: Once | INTRAVENOUS | Status: AC
  Administered 2017-05-09: 66 mg via INTRAVENOUS
  Filled 2017-05-09: qty 66

## 2017-05-09 MED ORDER — POTASSIUM CHLORIDE 2 MEQ/ML IV SOLN: Freq: Once | INTRAVENOUS | Status: DC

## 2017-05-10 ENCOUNTER — Other Ambulatory Visit: Payer: Self-pay | Admitting: Hematology and Oncology

## 2017-05-10 ENCOUNTER — Emergency Department
Admission: EM | Admit: 2017-05-10 | Discharge: 2017-05-11 | Disposition: A | Discharge status: Home / Self Care | Payer: Medicare Other | Attending: Emergency Medicine | Admitting: Emergency Medicine

## 2017-05-10 ENCOUNTER — Encounter: Payer: Self-pay | Admitting: Emergency Medicine

## 2017-05-10 ENCOUNTER — Ambulatory Visit
Admission: RE | Admit: 2017-05-10 | Discharge: 2017-05-10 | Disposition: A | Discharge status: Home / Self Care | Payer: Medicare Other | Source: Ambulatory Visit | Attending: Radiation Oncology | Admitting: Radiation Oncology

## 2017-05-10 DIAGNOSIS — R531 Weakness: Secondary | ICD-10-CM | POA: Insufficient documentation

## 2017-05-10 DIAGNOSIS — C539 Malignant neoplasm of cervix uteri, unspecified: Secondary | ICD-10-CM | POA: Diagnosis not present

## 2017-05-10 DIAGNOSIS — Z79899 Other long term (current) drug therapy: Secondary | ICD-10-CM | POA: Diagnosis not present

## 2017-05-10 DIAGNOSIS — D069 Carcinoma in situ of cervix, unspecified: Secondary | ICD-10-CM | POA: Diagnosis not present

## 2017-05-10 LAB — BASIC METABOLIC PANEL
ANION GAP: 8 (ref 5–15)
BUN: 18 mg/dL (ref 6–20)
CALCIUM: 8.9 mg/dL (ref 8.9–10.3)
CO2: 25 mmol/L (ref 22–32)
Chloride: 107 mmol/L (ref 101–111)
Creatinine, Ser: 0.57 mg/dL (ref 0.44–1.00)
Glucose, Bld: 198 mg/dL — ABNORMAL HIGH (ref 65–99)
Potassium: 3.8 mmol/L (ref 3.5–5.1)
Sodium: 140 mmol/L (ref 135–145)

## 2017-05-10 LAB — CBC
HCT: 37.7 % (ref 35.0–47.0)
HEMOGLOBIN: 13 g/dL (ref 12.0–16.0)
MCH: 30.8 pg (ref 26.0–34.0)
MCHC: 34.7 g/dL (ref 32.0–36.0)
MCV: 89 fL (ref 80.0–100.0)
Platelets: 230 10*3/uL (ref 150–440)
RBC: 4.23 MIL/uL (ref 3.80–5.20)
RDW: 12.3 % (ref 11.5–14.5)
WBC: 14.1 10*3/uL — ABNORMAL HIGH (ref 3.6–11.0)

## 2017-05-10 MED ORDER — SODIUM CHLORIDE 0.9 % IV SOLN
8.0000 mg | Freq: Once | INTRAVENOUS | Status: AC
  Administered 2017-05-10: 8 mg via INTRAVENOUS
  Filled 2017-05-10: qty 4

## 2017-05-10 MED ORDER — ONDANSETRON HCL 4 MG/2ML IJ SOLN
INTRAMUSCULAR | Status: AC
  Filled 2017-05-10: qty 2

## 2017-05-10 MED ORDER — SODIUM CHLORIDE 0.9 % IV BOLUS (SEPSIS)
1000.0000 mL | Freq: Once | INTRAVENOUS | Status: AC
Start: 2017-05-10 — End: 2017-05-10
  Administered 2017-05-10: 1000 mL via INTRAVENOUS

## 2017-05-10 NOTE — ED Triage Notes (Signed)
Pt presents to ED c/o weakness. Pt with underlying cervical cancer; radiation daily with chemo on weekends

## 2017-05-10 NOTE — ED Provider Notes (Signed)
[facility] Emergency Department Provider Note  ____________________________________________   First MD Initiated Contact with Patient 05/10/17 2146     (approximate)  I have reviewed the triage vital signs and the nursing notes.   HISTORY  Chief Complaint Weakness  HPI Kendra Herring is a 56 y.o. female who comes to the emergency department via EMS with generalized fatigue. She has a past medical history of stage II cervical cancer and recently began treatment with chemotherapy and radiation yesterday. She lives at home alone and has had a difficult time after her first dose of chemotherapy. She is been able to tolerate her Zofran but she feels rundown and tired. She denies chest pain or shortness of breath. She reports moderate diffuse abdominal pain. Nothing seems to make it better or worse. She has not actually vomited.   Past Medical History:  Diagnosis Date  . Anxiety   . Arrhythmia   . Arthritis   . Cervical cancer ([place])   . Hernia of abdominal wall 04/17/2017    Patient Active Problem List   Diagnosis Date Noted  . Anxiety disorder, unspecified 04/30/2017  . Squamous cell carcinoma of cervix ([place]) 04/30/2017  . Cervical carcinoma ([place]) 04/06/2017  . Hip pain 05/02/2016  . History of total hip arthroplasty 05/02/2016    Past Surgical History:  Procedure Laterality Date  . HIP SURGERY    . TUBAL LIGATION      Prior to Admission medications   Medication Sig Start Date End Date Taking? Authorizing Provider  busPIRone (BUSPAR) 15 MG tablet buspirone 15 mg tablet   Yes [provider]  citalopram (CELEXA) 10 MG tablet  04/19/17  Yes [provider]  clonazePAM (KLONOPIN) 0.5 MG tablet take 1/2 to 1 tablet by mouth twice a day if needed FOR SEVERE ANXIETY 04/19/17  Yes [provider]  NON FORMULARY Inject 1 Dose into the vein once a week. Pt gets chemo treatment  Weekly.  Cisplatium Emend Aloxi   Yes [provider]  ondansetron (ZOFRAN) 8 MG tablet Take 1 tablet (8 mg total) by mouth 2 (two) times daily as needed. 04/30/17  Yes Lloyd Huger, MD  prochlorperazine (COMPAZINE) 10 MG tablet Take 1 tablet (10 mg total) by mouth every 6 (six) hours as needed (Nausea or vomiting). 04/30/17  Yes Lloyd Huger, MD  cephALEXin (KEFLEX) 500 MG capsule Take 500 mg by mouth 2 (two) times daily.    [provider]    Allergies Patient has no known allergies.  Family History  Problem Relation Age of Onset  . Diabetes Mother     Social History Social History  Substance Use Topics  . Smoking status: Never Smoker  . Smokeless tobacco: Never Used  . Alcohol use No    Review of Systems Constitutional: No fever/chills Eyes: No visual changes. ENT: No sore throat. Cardiovascular: Denies chest pain. Respiratory: Denies shortness of breath. Gastrointestinal: Positive abdominal pain.  Positive nausea, no vomiting.  No diarrhea.  No constipation. Genitourinary: Negative for dysuria. Musculoskeletal: Negative for back pain. Skin: Negative for rash. Neurological: Negative for headaches, focal weakness or numbness.   ____________________________________________   PHYSICAL EXAM:  VITAL SIGNS: ED Triage Vitals [05/10/17 2015]  Enc Vitals Group     BP (!) 111/59     Pulse Rate 70     Resp 18     Temp 98 F (36.7 C)     Temp Source Oral     SpO2 96 %  Weight 151 lb (68.5 kg)     Height 4\' 11"  (1.499 m)     Head Circumference      Peak Flow      Pain Score      Pain Loc      Pain Edu?      Excl. in [place]?     Constitutional: Alert and oriented 4 well appearing nontoxic no diaphoresis speaks full clear sentences Eyes: PERRL EOMI. Head: Atraumatic. Nose: No congestion/rhinnorhea. Mouth/Throat: No trismus Neck: No stridor.   Cardiovascular: Normal rate, regular rhythm. Grossly normal heart sounds.  Good peripheral circulation. Respiratory: Normal respiratory  effort.  No retractions. Lungs CTAB and moving good air Gastrointestinal: Soft nontender Musculoskeletal: No lower extremity edema   Neurologic:  Normal speech and language. No gross focal neurologic deficits are appreciated. Skin:  Skin is warm, dry and intact. No rash noted. Psychiatric: Mood and affect are normal. Speech and behavior are normal.    ____________________________________________   _______________________________________   LABS (all labs ordered are listed, but only abnormal results are displayed)  Labs Reviewed  BASIC METABOLIC PANEL - Abnormal; Notable for the following:       Result Value   Glucose, Bld 198 (*)    All other components within normal limits  CBC - Abnormal; Notable for the following:    WBC 14.1 (*)    All other components within normal limits  URINALYSIS, COMPLETE (UACMP) WITH MICROSCOPIC  CBG MONITORING, ED    Elevated white count is nonspecific and likely represents stress  EKG  ____________________________________________  RADIOLOGY   ____________________________________________   PROCEDURES  Procedure(s) performed: no  Procedures  Critical Care performed: no  Observation: no ____________________________________________   INITIAL IMPRESSION / ASSESSMENT AND PLAN / ED COURSE  Pertinent labs & imaging results that were available during my care of the patient were reviewed by me and considered in my medical decision making (see chart for details).  The patient arrives hemodynamically stable and well appearing. Her symptoms are expected complications of chemotherapy. She feels significantly improved after intravenous ondansetron and IV fluids. She'll be discharged home with her Zofran as artery prescribed. Strict return precautions given.      ____________________________________________   FINAL CLINICAL IMPRESSION(S) / ED DIAGNOSES  Final diagnoses:  Generalized weakness      NEW MEDICATIONS STARTED DURING  THIS VISIT:  New Prescriptions   No medications on file     Note:  This document was prepared using Dragon voice recognition software and may include unintentional dictation errors.     Darel Hong, MD 05/11/17 0000

## 2017-05-10 NOTE — Discharge Instructions (Signed)
Fortunately today your blood work was normal. Please make sure you remain well-hydrated and use your Zofran at home as needed. Follow-up with your primary care physician and your oncologist as scheduled and return to the emergency department for any concerns.  It was a pleasure to take care of you today, and thank you for coming to our emergency department.  If you have any questions or concerns before leaving please ask the nurse to grab me and I'm more than happy to go through your aftercare instructions again.  If you were prescribed any opioid pain medication today such as Norco, Vicodin, Percocet, morphine, hydrocodone, or oxycodone please make sure you do not drive when you are taking this medication as it can alter your ability to drive safely.  If you have any concerns once you are home that you are not improving or are in fact getting worse before you can make it to your follow-up appointment, please do not hesitate to call 911 and come back for further evaluation.  Darel Hong MD  Results for orders placed or performed during the hospital encounter of 45/80/99  Basic metabolic panel  Result Value Ref Range   Sodium 140 135 - 145 mmol/L   Potassium 3.8 3.5 - 5.1 mmol/L   Chloride 107 101 - 111 mmol/L   CO2 25 22 - 32 mmol/L   Glucose, Bld 198 (H) 65 - 99 mg/dL   BUN 18 6 - 20 mg/dL   Creatinine, Ser 0.57 0.44 - 1.00 mg/dL   Calcium 8.9 8.9 - 10.3 mg/dL   GFR calc non Af Amer >60 >60 mL/min   GFR calc Af Amer >60 >60 mL/min   Anion gap 8 5 - 15  CBC  Result Value Ref Range   WBC 14.1 (H) 3.6 - 11.0 K/uL   RBC 4.23 3.80 - 5.20 MIL/uL   Hemoglobin 13.0 12.0 - 16.0 g/dL   HCT 37.7 35.0 - 47.0 %   MCV 89.0 80.0 - 100.0 fL   MCH 30.8 26.0 - 34.0 pg   MCHC 34.7 32.0 - 36.0 g/dL   RDW 12.3 11.5 - 14.5 %   Platelets 230 150 - 440 K/uL   Mr Pelvis W Wo Contrast  Result Date: 04/18/2017 CLINICAL DATA:  Newly diagnosed cervical carcinoma. EXAM: MRI PELVIS WITHOUT AND WITH  CONTRAST TECHNIQUE: Multiplanar multisequence MR imaging of the pelvis was performed both before and after administration of intravenous contrast. CONTRAST:  14 mL MultiHance COMPARISON:  PET-CT on 04/17/2017 FINDINGS: Urinary Tract: Unremarkable urinary bladder. Bowel: Unremarkable pelvic bowel loops. Vascular/Lymphatic:  No pathologically enlarged lymph nodes. Reproductive: Uterus: Image degradation by artifact from right hip prosthesis noted. Measures 6.8 x 3.9 x 4.5 cm (volume = 62 cm^3). No fibroids identified. A subtle ill-defined mass with mild hypervascularity and T2 hypointensity is seen in the posterior wall of the cervix which measures approximately 1.8 x 1.9 cm on image 10/series 5. This corresponds to focal site of hypermetabolic activity on recent PET scan, consistent with known cervical carcinoma. No evidence of extension into the inferior vagina, or invasion of parametrial soft tissues. Right ovary: Appears normal.  No mass identified. Left ovary:  Appears normal.  No mass identified. Other: No abnormal free fluid. Musculoskeletal:  Unremarkable. IMPRESSION: Image degradation by artifact from right hip prosthesis noted. Subtle ill-defined 1.9 cm mass in the posterior wall of the cervix, consistent with known cervical carcinoma. No evidence of extra-cervical tumor extension or pelvic lymphadenopathy. Electronically Signed   By: Jenny Reichmann  Kris Hartmann M.D.   On: 04/18/2017 14:16   Nm Pet Image Initial (pi) Skull Base To Thigh  Result Date: 04/17/2017 CLINICAL DATA:  Initial treatment strategy for cervical carcinoma. EXAM: NUCLEAR MEDICINE PET SKULL BASE TO THIGH TECHNIQUE: 13.2 mCi F-18 FDG was injected intravenously. Full-ring PET imaging was performed from the skull base to thigh after the radiotracer. CT data was obtained and used for attenuation correction and anatomic localization. FASTING BLOOD GLUCOSE:  Value: 125 mg/dl COMPARISON:  CT scan 03/28/2017 FINDINGS: NECK No hypermetabolic lymph nodes in the  neck. Upper normal size level 2 cervical lymph nodes on the left are not hypermetabolic. CHEST No hypermetabolic mediastinal or hilar nodes. No suspicious pulmonary nodules on the CT scan. ABDOMEN/PELVIS There is a focus of activity posteriorly along the cervix with maximum SUV 6.4, favoring focal malignancy. No hypermetabolic adenopathy in the abdomen or pelvis. No evidence of distant spread. A 0.8 cm right pelvic sidewall lymph node on image 207/3 has a fatty hilum and is not hypermetabolic. SKELETON Right total hip prosthesis noted with proximal cerclage wires along the stem component. No findings of osseous metastatic disease. There is evidence of spondylosis and degenerative disc disease with potential impingement at the L3-4 and L4-5 levels. IMPRESSION: 1. Small focus of abnormal accentuated activity along the posterior cervix, potentially a site of focal neoplastic disease/malignancy. No pathologic adenopathy or evidence of metastatic spread. 2. Right total hip prosthesis noted with cerclage wires along the stem component. 3. Lumbar spondylosis and degenerative disc disease with potential impingement at L3-4 and L4-5. Electronically Signed   By: Van Clines M.D.   On: 04/17/2017 12:47

## 2017-05-11 ENCOUNTER — Ambulatory Visit
Admission: RE | Admit: 2017-05-11 | Discharge: 2017-05-11 | Disposition: A | Discharge status: Home / Self Care | Payer: Medicare Other | Source: Ambulatory Visit | Attending: Radiation Oncology | Admitting: Radiation Oncology

## 2017-05-11 DIAGNOSIS — C539 Malignant neoplasm of cervix uteri, unspecified: Secondary | ICD-10-CM | POA: Diagnosis not present

## 2017-05-14 ENCOUNTER — Ambulatory Visit
Admission: RE | Admit: 2017-05-14 | Discharge: 2017-05-14 | Disposition: A | Discharge status: Home / Self Care | Payer: Medicare Other | Source: Ambulatory Visit | Attending: Radiation Oncology | Admitting: Radiation Oncology

## 2017-05-14 DIAGNOSIS — C539 Malignant neoplasm of cervix uteri, unspecified: Secondary | ICD-10-CM | POA: Diagnosis not present

## 2017-05-15 ENCOUNTER — Ambulatory Visit
Admission: RE | Admit: 2017-05-15 | Discharge: 2017-05-15 | Disposition: A | Discharge status: Home / Self Care | Payer: Medicare Other | Source: Ambulatory Visit | Attending: Radiation Oncology | Admitting: Radiation Oncology

## 2017-05-15 ENCOUNTER — Encounter (HOSPITAL_COMMUNITY): Payer: Self-pay

## 2017-05-15 DIAGNOSIS — C539 Malignant neoplasm of cervix uteri, unspecified: Secondary | ICD-10-CM | POA: Diagnosis not present

## 2017-05-15 NOTE — Progress Notes (Signed)
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   INTERVAL HISTORY: Patient returns to clinic today for further evaluation and consideration of cycle 2 of weekly cisplatin. She is anxious and tearful, but otherwise feels well. She has no neurologic complaints. She denies any recent fevers or illnesses. She admits to a decrease in appetite and weight loss. She also admits to dry mouth. She denies any chest pain or shortness of breath. She has no nausea, vomiting, constipation, or diarrhea. She denies any melena or hematochezia. She has no urinary complaints. Patient offers no further specific complaints today.  REVIEW OF SYSTEMS:   Review of Systems  Constitutional: Positive for weight loss. Negative for fever and malaise/fatigue.       Lack of appetite  Eyes: Negative.   Respiratory: Negative.  Negative for cough and shortness of breath.   Cardiovascular: Negative.  Negative for chest pain and leg swelling.  Gastrointestinal: Negative.  Negative for abdominal pain, blood in stool and melena.  Genitourinary: Negative.  Negative for hematuria.  Musculoskeletal: Negative.   Skin: Negative.  Negative for rash.  Neurological: Negative.  Negative for sensory change and weakness.  Psychiatric/Behavioral: The patient is nervous/anxious and has insomnia.     As per HPI. Otherwise, a complete review of systems is negative.  PAST MEDICAL HISTORY: Past Medical History:  Diagnosis Date  . Anxiety   . Arrhythmia   . Arthritis   . Cervical cancer ([place])   . Hernia of abdominal wall 04/17/2017    PAST SURGICAL HISTORY: Past Surgical History:  Procedure Laterality Date  . HIP SURGERY    . TUBAL LIGATION       FAMILY HISTORY: Family History  Problem Relation Age of Onset  . Diabetes Mother     ADVANCED DIRECTIVES (Y/N):  N  HEALTH MAINTENANCE: Social History  Substance Use Topics  . Smoking status: Never Smoker  . Smokeless tobacco: Never Used  . Alcohol use No     Colonoscopy:  PAP:  Bone density:  Lipid panel:  No Known Allergies  Current Outpatient Prescriptions  Medication Sig Dispense Refill  . busPIRone (BUSPAR) 15 MG tablet buspirone 15 mg tablet    . citalopram (CELEXA) 10 MG tablet     . clonazePAM (KLONOPIN) 0.5 MG tablet take 1/2 to 1 tablet by mouth twice a day if needed FOR SEVERE ANXIETY    . NON FORMULARY Inject 1 Dose into the vein once a week. Pt gets chemo treatment  Weekly.  Cisplatium Emend Aloxi    . ondansetron (ZOFRAN) 8 MG tablet Take 1 tablet (8 mg total) by mouth 2 (two) times daily as needed. 60 tablet 2  . prochlorperazine (COMPAZINE) 10 MG tablet Take 1 tablet (10 mg total) by mouth every 6 (six) hours as needed (Nausea or vomiting). 60 tablet 2  . cephALEXin (KEFLEX) 500 MG capsule Take 500 mg by mouth 2 (two) times daily.    . magic mouthwash SOLN Take 5 mLs by mouth 3 (three) times daily. 240 mL 0  . megestrol (MEGACE) 40 MG tablet Take 1 tablet (40 mg total) by mouth daily. 30 tablet 1   No current facility-administered medications for this visit.    Facility-Administered Medications Ordered in Other Visits  Medication Dose Route Frequency Provider Last Rate Last Dose  . 0.9 %  sodium chloride infusion   Intravenous Once Lloyd Huger, MD      . CISplatin (PLATINOL) 66 mg in sodium chloride 0.9 % 250 mL chemo infusion  40 mg/m2 (Treatment Plan Recorded) Intravenous Once Lloyd Huger, MD      . fosaprepitant (EMEND) 150 mg, dexamethasone (DECADRON) 12 mg in sodium chloride 0.9 % 145 mL IVPB   Intravenous Once Lloyd Huger, MD      . palonosetron (ALOXI) injection 0.25 mg  0.25 mg Intravenous Once Lloyd Huger, MD         OBJECTIVE: Vitals:   05/16/17 0909  BP: (!) 148/85  Pulse: 89  Resp: 18  Temp: (!) 97.1 F (36.2 C)     Body mass index is 29.95 kg/m.    ECOG FS:0 - Asymptomatic  General: Well-developed, well-nourished, no acute distress. Eyes: Pink conjunctiva, anicteric sclera. Lungs: Clear to auscultation bilaterally. Heart: Regular rate and rhythm. No rubs, murmurs, or gallops. Abdomen: Soft, nontender, nondistended. No organomegaly noted, normoactive bowel sounds. Gyn: Exam recently performed by gynecology oncology. Musculoskeletal: No edema, cyanosis, or clubbing. Neuro: Alert, answering all questions appropriately. Cranial nerves grossly intact. Skin: No rashes or petechiae noted. Psych: Normal affect.  LAB RESULTS:  Lab Results  Component Value Date   NA 137 05/16/2017   K 3.8 05/16/2017   CL 103 05/16/2017   CO2 26 05/16/2017   GLUCOSE 118 (H) 05/16/2017   BUN 13 05/16/2017   CREATININE 0.54 05/16/2017   CALCIUM 9.9 05/16/2017   GFRNONAA >60 05/16/2017   GFRAA >60 05/16/2017    Lab Results  Component Value Date   WBC 6.9 05/16/2017   NEUTROABS 5.0 05/16/2017   HGB 14.6 05/16/2017   HCT 41.6 05/16/2017   MCV 88.6 05/16/2017   PLT 235 05/16/2017     STUDIES: Mr Pelvis W Wo Contrast  Result Date: 04/18/2017 CLINICAL DATA:  Newly diagnosed cervical carcinoma. EXAM: MRI PELVIS WITHOUT AND WITH CONTRAST TECHNIQUE: Multiplanar multisequence MR imaging of the pelvis was performed both before and after administration of intravenous contrast. CONTRAST:  14 mL MultiHance COMPARISON:  PET-CT on 04/17/2017 FINDINGS: Urinary Tract: Unremarkable urinary bladder. Bowel: Unremarkable pelvic bowel loops. Vascular/Lymphatic:  No pathologically enlarged lymph nodes. Reproductive: Uterus: Image degradation by artifact from right hip prosthesis noted. Measures 6.8 x 3.9 x 4.5 cm (volume = 62 cm^3). No fibroids identified. A subtle ill-defined mass with mild hypervascularity and T2  hypointensity is seen in the posterior wall of the cervix which measures approximately 1.8 x 1.9 cm on image 10/series 5. This corresponds to focal site of hypermetabolic activity on recent PET scan, consistent with known cervical carcinoma. No evidence of extension into the inferior vagina, or invasion of parametrial soft tissues. Right ovary: Appears normal.  No mass identified. Left ovary:  Appears normal.  No mass identified. Other: No abnormal free fluid. Musculoskeletal:  Unremarkable. IMPRESSION: Image degradation by artifact from right hip prosthesis noted. Subtle ill-defined 1.9 cm mass in the posterior wall of the cervix, consistent with known cervical carcinoma. No evidence of extra-cervical tumor extension or pelvic lymphadenopathy. Electronically Signed   By: Earle Gell M.D.   On: 04/18/2017 14:16   Nm Pet Image Initial (pi) Skull Base To Thigh  Result Date: 04/17/2017 CLINICAL DATA:  Initial treatment strategy for cervical carcinoma. EXAM: NUCLEAR MEDICINE PET SKULL BASE TO THIGH TECHNIQUE: 13.2 mCi F-18 FDG was injected intravenously. Full-ring  PET imaging was performed from the skull base to thigh after the radiotracer. CT data was obtained and used for attenuation correction and anatomic localization. FASTING BLOOD GLUCOSE:  Value: 125 mg/dl COMPARISON:  CT scan 03/28/2017 FINDINGS: NECK No hypermetabolic lymph nodes in the neck. Upper normal size level 2 cervical lymph nodes on the left are not hypermetabolic. CHEST No hypermetabolic mediastinal or hilar nodes. No suspicious pulmonary nodules on the CT scan. ABDOMEN/PELVIS There is a focus of activity posteriorly along the cervix with maximum SUV 6.4, favoring focal malignancy. No hypermetabolic adenopathy in the abdomen or pelvis. No evidence of distant spread. A 0.8 cm right pelvic sidewall lymph node on image 207/3 has a fatty hilum and is not hypermetabolic. SKELETON Right total hip prosthesis noted with proximal cerclage wires along the  stem component. No findings of osseous metastatic disease. There is evidence of spondylosis and degenerative disc disease with potential impingement at the L3-4 and L4-5 levels. IMPRESSION: 1. Small focus of abnormal accentuated activity along the posterior cervix, potentially a site of focal neoplastic disease/malignancy. No pathologic adenopathy or evidence of metastatic spread. 2. Right total hip prosthesis noted with cerclage wires along the stem component. 3. Lumbar spondylosis and degenerative disc disease with potential impingement at L3-4 and L4-5. Electronically Signed   By: Van Clines M.D.   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  PLAN:    1. 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 Patient previously was determined not to be a surgical candidate given the location of her malignancy. Patient initiated daily XRT last week. She will benefit from concurrent chemotherapy using weekly cisplatin. At the conclusion of her radiation and chemotherapy, patient will go to [facility] to receive brachytherapy. Proceed with cycle 2 of weekly cisplatin. Return to clinic in 1 week for consideration of cycle 3. 2. Weight loss/lack of appetite: RX Megace 40 mg daily. Gave a few Ensures to trial. She unfortunately is on a fixed income and does not get paid until august 4th. Barnabas Lister was notified for possible help with medications and supplements. A referral was placed to see Jolie as well one day this week.  3. Dry Mouth: RX Magic Mouthwash.   Approximately 30 minutes was spent in discussion of which greater than 50% was consultation.  Patient expressed understanding and was in agreement with this plan. She also understands that She can call clinic at any time with any questions, concerns, or complaints.   Cancer Staging Cervical carcinoma [facility]) Staging form: Cervix  Uteri, AJCC 8th Edition - Clinical stage from 04/27/2017: FIGO Stage IIA (cT2a, cN0, cM0) - Signed by Lloyd Huger, MD on 04/27/2017   Jacquelin Hawking, NP   05/16/2017 11:22 AM

## 2017-05-15 NOTE — Progress Notes (Signed)
Social worker met with patient and provided counseling services at the cancer center. 

## 2017-05-16 ENCOUNTER — Inpatient Hospital Stay (HOSPITAL_BASED_OUTPATIENT_CLINIC_OR_DEPARTMENT_OTHER): Payer: Medicare Other | Admitting: Oncology

## 2017-05-16 ENCOUNTER — Inpatient Hospital Stay: Payer: Medicare Other

## 2017-05-16 ENCOUNTER — Other Ambulatory Visit: Payer: Self-pay | Admitting: *Deleted

## 2017-05-16 ENCOUNTER — Ambulatory Visit
Admission: RE | Admit: 2017-05-16 | Discharge: 2017-05-16 | Disposition: A | Discharge status: Home / Self Care | Payer: Medicare Other | Source: Ambulatory Visit | Attending: Radiation Oncology | Admitting: Radiation Oncology

## 2017-05-16 VITALS — BP 148/85 | HR 89 | Temp 97.1°F | Resp 18 | Wt 148.3 lb

## 2017-05-16 DIAGNOSIS — C539 Malignant neoplasm of cervix uteri, unspecified: Secondary | ICD-10-CM

## 2017-05-16 DIAGNOSIS — R634 Abnormal weight loss: Secondary | ICD-10-CM

## 2017-05-16 DIAGNOSIS — Z79899 Other long term (current) drug therapy: Secondary | ICD-10-CM | POA: Diagnosis not present

## 2017-05-16 DIAGNOSIS — R682 Dry mouth, unspecified: Secondary | ICD-10-CM

## 2017-05-16 DIAGNOSIS — R630 Anorexia: Secondary | ICD-10-CM | POA: Diagnosis not present

## 2017-05-16 DIAGNOSIS — Z5111 Encounter for antineoplastic chemotherapy: Secondary | ICD-10-CM | POA: Diagnosis not present

## 2017-05-16 LAB — CBC WITH DIFFERENTIAL/PLATELET
BASOS ABS: 0 10*3/uL (ref 0–0.1)
BASOS PCT: 0 %
EOS ABS: 0.1 10*3/uL (ref 0–0.7)
Eosinophils Relative: 2 %
HEMATOCRIT: 41.6 % (ref 35.0–47.0)
HEMOGLOBIN: 14.6 g/dL (ref 12.0–16.0)
Lymphocytes Relative: 20 %
Lymphs Abs: 1.4 10*3/uL (ref 1.0–3.6)
MCH: 31.2 pg (ref 26.0–34.0)
MCHC: 35.2 g/dL (ref 32.0–36.0)
MCV: 88.6 fL (ref 80.0–100.0)
MONO ABS: 0.4 10*3/uL (ref 0.2–0.9)
Monocytes Relative: 6 %
NEUTROS ABS: 5 10*3/uL (ref 1.4–6.5)
NEUTROS PCT: 72 %
Platelets: 235 10*3/uL (ref 150–440)
RBC: 4.69 MIL/uL (ref 3.80–5.20)
RDW: 11.9 % (ref 11.5–14.5)
WBC: 6.9 10*3/uL (ref 3.6–11.0)

## 2017-05-16 LAB — BASIC METABOLIC PANEL
ANION GAP: 8 (ref 5–15)
BUN: 13 mg/dL (ref 6–20)
CALCIUM: 9.9 mg/dL (ref 8.9–10.3)
CO2: 26 mmol/L (ref 22–32)
Chloride: 103 mmol/L (ref 101–111)
Creatinine, Ser: 0.54 mg/dL (ref 0.44–1.00)
GFR calc non Af Amer: >60 mL/min (ref >60)
Glucose, Bld: 118 mg/dL — ABNORMAL HIGH (ref 65–99)
Potassium: 3.8 mmol/L (ref 3.5–5.1)
SODIUM: 137 mmol/L (ref 135–145)

## 2017-05-16 MED ORDER — SODIUM CHLORIDE 0.9 % IV SOLN
40.0000 mg/m2 | Freq: Once | INTRAVENOUS | Status: AC
  Administered 2017-05-16: 66 mg via INTRAVENOUS
  Filled 2017-05-16: qty 66

## 2017-05-16 MED ORDER — SODIUM CHLORIDE 0.9 % IV SOLN
Freq: Once | INTRAVENOUS | Status: AC
  Administered 2017-05-16: 13:00:00 via INTRAVENOUS
  Filled 2017-05-16: qty 5

## 2017-05-16 MED ORDER — MAGIC MOUTHWASH: 5.0000 mL | Freq: Three times a day (TID) | ORAL | 0 refills | Status: DC

## 2017-05-16 MED ORDER — MEGESTROL ACETATE 40 MG PO TABS: 40.0000 mg | ORAL_TABLET | Freq: Every day | ORAL | 1 refills | Status: DC

## 2017-05-16 MED ORDER — SODIUM CHLORIDE 0.9 % IV SOLN
Freq: Once | INTRAVENOUS | Status: AC
  Administered 2017-05-16: 10:00:00 via INTRAVENOUS
  Filled 2017-05-16: qty 1000

## 2017-05-16 MED ORDER — PALONOSETRON HCL INJECTION 0.25 MG/5ML
0.2500 mg | Freq: Once | INTRAVENOUS | Status: AC
  Administered 2017-05-16: 0.25 mg via INTRAVENOUS
  Filled 2017-05-16: qty 5

## 2017-05-16 MED ORDER — MAGNESIUM SULFATE 4 GM/100ML IV SOLN
Freq: Once | INTRAVENOUS | Status: AC
  Administered 2017-05-16: 11:00:00 via INTRAVENOUS
  Filled 2017-05-16: qty 1000

## 2017-05-16 NOTE — Progress Notes (Signed)
Patient is here for follow up  

## 2017-05-17 ENCOUNTER — Ambulatory Visit
Admission: RE | Admit: 2017-05-17 | Discharge: 2017-05-17 | Disposition: A | Discharge status: Home / Self Care | Payer: Medicare Other | Source: Ambulatory Visit | Attending: Radiation Oncology | Admitting: Radiation Oncology

## 2017-05-17 DIAGNOSIS — C539 Malignant neoplasm of cervix uteri, unspecified: Secondary | ICD-10-CM | POA: Diagnosis not present

## 2017-05-18 ENCOUNTER — Telehealth: Payer: Self-pay | Admitting: *Deleted

## 2017-05-18 ENCOUNTER — Other Ambulatory Visit: Payer: Self-pay | Admitting: Oncology

## 2017-05-18 ENCOUNTER — Other Ambulatory Visit: Payer: Self-pay | Admitting: *Deleted

## 2017-05-18 ENCOUNTER — Ambulatory Visit
Admission: RE | Admit: 2017-05-18 | Discharge: 2017-05-18 | Disposition: A | Discharge status: Home / Self Care | Payer: Medicare Other | Source: Ambulatory Visit | Attending: Radiation Oncology | Admitting: Radiation Oncology

## 2017-05-18 DIAGNOSIS — C539 Malignant neoplasm of cervix uteri, unspecified: Secondary | ICD-10-CM

## 2017-05-18 MED ORDER — LORAZEPAM 0.5 MG PO TABS: 0.5000 mg | ORAL_TABLET | Freq: Every evening | ORAL | 0 refills | Status: DC | PRN

## 2017-05-18 NOTE — Telephone Encounter (Signed)
Patient is requesting prescription for Ativan to help her sleep, Magic Mouthwash also was never received by her pharmacy. Her prescriptions go to [facility], fax number is [phone].

## 2017-05-21 ENCOUNTER — Ambulatory Visit
Admission: RE | Admit: 2017-05-21 | Discharge: 2017-05-21 | Disposition: A | Discharge status: Home / Self Care | Payer: Medicare Other | Source: Ambulatory Visit | Attending: Radiation Oncology | Admitting: Radiation Oncology

## 2017-05-21 DIAGNOSIS — C539 Malignant neoplasm of cervix uteri, unspecified: Secondary | ICD-10-CM | POA: Diagnosis not present

## 2017-05-22 ENCOUNTER — Other Ambulatory Visit: Payer: Self-pay | Admitting: *Deleted

## 2017-05-22 ENCOUNTER — Encounter (HOSPITAL_COMMUNITY): Payer: Self-pay

## 2017-05-22 ENCOUNTER — Ambulatory Visit
Admission: RE | Admit: 2017-05-22 | Discharge: 2017-05-22 | Disposition: A | Discharge status: Home / Self Care | Payer: Medicare Other | Source: Ambulatory Visit | Attending: Radiation Oncology | Admitting: Radiation Oncology

## 2017-05-22 DIAGNOSIS — C539 Malignant neoplasm of cervix uteri, unspecified: Secondary | ICD-10-CM | POA: Diagnosis not present

## 2017-05-22 NOTE — Progress Notes (Signed)
Social worker provided counseling services to patient at the cancer center. 

## 2017-05-23 ENCOUNTER — Inpatient Hospital Stay: Payer: Medicare Other | Attending: Oncology | Admitting: Oncology

## 2017-05-23 ENCOUNTER — Inpatient Hospital Stay: Payer: Medicare Other

## 2017-05-23 ENCOUNTER — Ambulatory Visit
Admission: RE | Admit: 2017-05-23 | Discharge: 2017-05-23 | Disposition: A | Discharge status: Home / Self Care | Payer: Medicare Other | Source: Ambulatory Visit | Attending: Radiation Oncology | Admitting: Radiation Oncology

## 2017-05-23 VITALS — BP 139/85 | HR 93 | Temp 97.1°F | Resp 20 | Wt 148.4 lb

## 2017-05-23 DIAGNOSIS — R112 Nausea with vomiting, unspecified: Secondary | ICD-10-CM | POA: Diagnosis not present

## 2017-05-23 DIAGNOSIS — R630 Anorexia: Secondary | ICD-10-CM

## 2017-05-23 DIAGNOSIS — C539 Malignant neoplasm of cervix uteri, unspecified: Secondary | ICD-10-CM

## 2017-05-23 DIAGNOSIS — G4700 Insomnia, unspecified: Secondary | ICD-10-CM | POA: Insufficient documentation

## 2017-05-23 DIAGNOSIS — R634 Abnormal weight loss: Secondary | ICD-10-CM | POA: Insufficient documentation

## 2017-05-23 DIAGNOSIS — Z5111 Encounter for antineoplastic chemotherapy: Secondary | ICD-10-CM | POA: Diagnosis not present

## 2017-05-23 DIAGNOSIS — N390 Urinary tract infection, site not specified: Secondary | ICD-10-CM | POA: Diagnosis not present

## 2017-05-23 DIAGNOSIS — R197 Diarrhea, unspecified: Secondary | ICD-10-CM | POA: Diagnosis not present

## 2017-05-23 DIAGNOSIS — R682 Dry mouth, unspecified: Secondary | ICD-10-CM | POA: Insufficient documentation

## 2017-05-23 DIAGNOSIS — Z79899 Other long term (current) drug therapy: Secondary | ICD-10-CM | POA: Insufficient documentation

## 2017-05-23 DIAGNOSIS — F419 Anxiety disorder, unspecified: Secondary | ICD-10-CM | POA: Diagnosis not present

## 2017-05-23 DIAGNOSIS — E876 Hypokalemia: Secondary | ICD-10-CM | POA: Diagnosis not present

## 2017-05-23 LAB — BASIC METABOLIC PANEL
Anion gap: 9 (ref 5–15)
BUN: 14 mg/dL (ref 6–20)
CALCIUM: 9.4 mg/dL (ref 8.9–10.3)
CO2: 24 mmol/L (ref 22–32)
CREATININE: 0.63 mg/dL (ref 0.44–1.00)
Chloride: 106 mmol/L (ref 101–111)
GFR calc non Af Amer: >60 mL/min (ref >60)
GLUCOSE: 137 mg/dL — AB (ref 65–99)
Potassium: 3.7 mmol/L (ref 3.5–5.1)
Sodium: 139 mmol/L (ref 135–145)

## 2017-05-23 LAB — CBC WITH DIFFERENTIAL/PLATELET
BASOS PCT: 0 %
Basophils Absolute: 0 10*3/uL (ref 0–0.1)
EOS ABS: 0.1 10*3/uL (ref 0–0.7)
Eosinophils Relative: 2 %
HEMATOCRIT: 38.6 % (ref 35.0–47.0)
Hemoglobin: 13.8 g/dL (ref 12.0–16.0)
Lymphocytes Relative: 10 %
Lymphs Abs: 0.6 10*3/uL — ABNORMAL LOW (ref 1.0–3.6)
MCH: 31.2 pg (ref 26.0–34.0)
MCHC: 35.7 g/dL (ref 32.0–36.0)
MCV: 87.2 fL (ref 80.0–100.0)
MONO ABS: 0.4 10*3/uL (ref 0.2–0.9)
Monocytes Relative: 6 %
NEUTROS ABS: 5.2 10*3/uL (ref 1.4–6.5)
NEUTROS PCT: 82 %
Platelets: 178 10*3/uL (ref 150–440)
RBC: 4.43 MIL/uL (ref 3.80–5.20)
RDW: 11.6 % (ref 11.5–14.5)
WBC: 6.3 10*3/uL (ref 3.6–11.0)

## 2017-05-23 MED ORDER — SODIUM CHLORIDE 0.9 % IV SOLN
Freq: Once | INTRAVENOUS | Status: AC
  Administered 2017-05-23: 13:00:00 via INTRAVENOUS
  Filled 2017-05-23: qty 5

## 2017-05-23 MED ORDER — CISPLATIN CHEMO INJECTION 100MG/100ML
40.0000 mg/m2 | Freq: Once | INTRAVENOUS | Status: AC
  Administered 2017-05-23: 66 mg via INTRAVENOUS
  Filled 2017-05-23: qty 66

## 2017-05-23 MED ORDER — POTASSIUM CHLORIDE 2 MEQ/ML IV SOLN
Freq: Once | INTRAVENOUS | Status: AC
  Administered 2017-05-23: 11:00:00 via INTRAVENOUS
  Filled 2017-05-23: qty 1000

## 2017-05-23 MED ORDER — SODIUM CHLORIDE 0.9 % IV SOLN
Freq: Once | INTRAVENOUS | Status: AC
  Administered 2017-05-23: 10:00:00 via INTRAVENOUS
  Filled 2017-05-23: qty 1000

## 2017-05-23 MED ORDER — HEPARIN SOD (PORK) LOCK FLUSH 100 UNIT/ML IV SOLN
500.0000 [IU] | Freq: Once | INTRAVENOUS | Status: DC | PRN
Start: 2017-05-23 — End: 2017-05-23
  Filled 2017-05-23: qty 5

## 2017-05-23 MED ORDER — PALONOSETRON HCL INJECTION 0.25 MG/5ML
0.2500 mg | Freq: Once | INTRAVENOUS | Status: AC
  Administered 2017-05-23: 0.25 mg via INTRAVENOUS
  Filled 2017-05-23: qty 5

## 2017-05-23 MED ORDER — POTASSIUM CHLORIDE 2 MEQ/ML IV SOLN: Freq: Once | INTRAVENOUS | Status: DC

## 2017-05-23 NOTE — Progress Notes (Signed)
Patient reports nausea, vomiting, diarrhea and heartburn.

## 2017-05-23 NOTE — Progress Notes (Signed)
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   INTERVAL HISTORY: Patient returns to clinic today for further evaluation and consideration of cycle 3 of weekly cisplatin. She continues to be anxious and tearful. Today she complains of nausea. She has not been taking her Zofran as prescribed. She also complains of diarrhea that is controlled with Imodium. Lastly she complains of heartburn which she was prescribed Zantac or Prilosec for by her PCP but she is not taking either. She has no neurologic complaints. She denies any recent fevers or illnesses. She admits to a better appetite but food doesn't taste the same. Her dry mouth is much better. She denies any chest pain or shortness of breath. She has no vomiting or constipation. She denies any melena or hematochezia. She has no urinary complaints. Patient offers no further specific complaints today.  REVIEW OF SYSTEMS:   Review of Systems  Constitutional: Positive for weight loss. Negative for fever and malaise/fatigue.       Lack of appetite  Eyes: Negative.   Respiratory: Negative.  Negative for cough and shortness of breath.   Cardiovascular: Negative.  Negative for chest pain and leg swelling.  Gastrointestinal: Positive for diarrhea, heartburn and nausea. Negative for abdominal pain, blood in stool and melena.  Genitourinary: Negative.  Negative for hematuria.  Musculoskeletal: Negative.   Skin: Negative.  Negative for rash.  Neurological: Negative.  Negative for sensory change and weakness.  Psychiatric/Behavioral: The patient is nervous/anxious and has insomnia.     As per HPI. Otherwise, a complete review of systems is negative.  PAST MEDICAL  HISTORY: Past Medical History:  Diagnosis Date  . Anxiety   . Arrhythmia   . Arthritis   . Cervical cancer ([place])   . Hernia of abdominal wall 04/17/2017    PAST SURGICAL HISTORY: Past Surgical History:  Procedure Laterality Date  . HIP SURGERY    . TUBAL LIGATION      FAMILY HISTORY: Family History  Problem Relation Age of Onset  . Diabetes Mother     ADVANCED DIRECTIVES (Y/N):  N  HEALTH MAINTENANCE: Social History  Substance Use Topics  . Smoking status: Never Smoker  . Smokeless tobacco: Never Used  . Alcohol use No     Colonoscopy:  PAP:  Bone density:  Lipid panel:  No Known Allergies  Current Outpatient Prescriptions  Medication Sig Dispense Refill  . LORazepam (ATIVAN) 0.5 MG tablet Take 1 tablet (0.5 mg total) by mouth at bedtime as needed for anxiety. 30 tablet 0  . magic mouthwash SOLN Take 5 mLs by mouth 3 (three) times daily. 240 mL 0  . megestrol (MEGACE) 40 MG tablet Take 1 tablet (40 mg total) by mouth daily. 30 tablet 1  . ondansetron (ZOFRAN) 8 MG tablet Take 1 tablet (8 mg total) by mouth 2 (two) times daily as needed. 60 tablet 2  . prochlorperazine (COMPAZINE) 10 MG tablet Take 1 tablet (10 mg total) by mouth every 6 (six) hours as needed (Nausea or vomiting). 60 tablet 2   No current facility-administered medications for this visit.    Facility-Administered Medications Ordered in Other Visits  Medication Dose Route Frequency Provider Last Rate Last Dose  . CISplatin (PLATINOL) 66 mg in sodium  chloride 0.9 % 250 mL chemo infusion  40 mg/m2 (Treatment Plan Recorded) Intravenous Once Lloyd Huger, MD 316 mL/hr at 05/23/17 1328 66 mg at 05/23/17 1328  . heparin lock flush 100 unit/mL  500 Units Intracatheter Once PRN Lloyd Huger, MD        OBJECTIVE: Vitals:   05/23/17 0942  BP: 139/85  Pulse: 93  Resp: 20  Temp: (!) 97.1 F (36.2 C)     Body mass index is 29.97 kg/m.    ECOG FS:0 - Asymptomatic  General:  Well-developed, well-nourished, no acute distress. Eyes: Pink conjunctiva, anicteric sclera. Lungs: Clear to auscultation bilaterally. Heart: Regular rate and rhythm. No rubs, murmurs, or gallops. Abdomen: Soft, nontender, nondistended. No organomegaly noted, normoactive bowel sounds. Gyn: Exam recently performed by gynecology oncology. Musculoskeletal: No edema, cyanosis, or clubbing. Neuro: Alert, answering all questions appropriately. Cranial nerves grossly intact. Skin: No rashes or petechiae noted. Psych: Normal affect.  LAB RESULTS:  Lab Results  Component Value Date   NA 139 05/23/2017   K 3.7 05/23/2017   CL 106 05/23/2017   CO2 24 05/23/2017   GLUCOSE 137 (H) 05/23/2017   BUN 14 05/23/2017   CREATININE 0.63 05/23/2017   CALCIUM 9.4 05/23/2017   GFRNONAA >60 05/23/2017   GFRAA >60 05/23/2017    Lab Results  Component Value Date   WBC 6.3 05/23/2017   NEUTROABS 5.2 05/23/2017   HGB 13.8 05/23/2017   HCT 38.6 05/23/2017   MCV 87.2 05/23/2017   PLT 178 05/23/2017     STUDIES: No results found.  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  PLAN:    1. 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 Patient previously was determined not to be a surgical candidate given the location of her malignancy. Patient initiated daily XRT three weeks ago. She will benefit from concurrent chemotherapy using weekly cisplatin. At the conclusion of her radiation and chemotherapy, patient will go to [facility] to receive brachytherapy. Proceed with cycle 3 of weekly cisplatin. Return to clinic in 1 week for consideration of cycle 4. 2. Weight loss/lack of appetite: She states her appetite is better. She thinks the Megace is helping but now she has heartburn with nausea. She was advised to take her Zofran as often as she can along with OTC zantac. Dietitian will see her  tomorrow. 3. Diarrhea: Continue Imodium as needed. Monitor. 4. Dry Mouth: Continue Magic Mouthwash. Much Better.  5. Insomnia: Continue Ativan as prescribed.  Approximately 30 minutes was spent in discussion of which greater than 50% was consultation.  Patient expressed understanding and was in agreement with this plan. She also understands that She can call clinic at any time with any questions, concerns, or complaints.   Cancer Staging Cervical carcinoma [facility]) Staging form: Cervix Uteri, AJCC 8th Edition - Clinical stage from 04/27/2017: FIGO Stage IIA (cT2a, cN0, cM0) - Signed by Lloyd Huger, MD on 04/27/2017   Jacquelin Hawking, NP   05/23/2017 1:59 PM

## 2017-05-24 ENCOUNTER — Ambulatory Visit
Admission: RE | Admit: 2017-05-24 | Discharge: 2017-05-24 | Disposition: A | Discharge status: Home / Self Care | Payer: Medicare Other | Source: Ambulatory Visit | Attending: Radiation Oncology | Admitting: Radiation Oncology

## 2017-05-24 ENCOUNTER — Inpatient Hospital Stay: Payer: Medicare Other

## 2017-05-24 ENCOUNTER — Other Ambulatory Visit: Payer: Self-pay | Admitting: *Deleted

## 2017-05-24 DIAGNOSIS — C539 Malignant neoplasm of cervix uteri, unspecified: Secondary | ICD-10-CM | POA: Diagnosis not present

## 2017-05-24 NOTE — Progress Notes (Addendum)
Nutrition Assessment   Reason for Assessment:   Referral for weight loss  ASSESSMENT:  56 year old female with stage II squamous cell carcinoma of cervix.  Patient is currently receiving cisplatin with radiation. Past medical history of anxiety, arrhythmia, arthritis.    Met with patient today in clinic and reports poor appetite, no taste for food, some nausea, heartburn, some diarrhea, recent cough.  Reports biggest issue effecting her appetite is stress.  Patient concerned about money, tearful during visit.  Hard to keep on track regarding her intake and nutrition.  Social Worker is working with patient on financial concerns.   Patient reports sometimes for breakfast may eat a grilled cheese, pudding, then around 1-2pm sandwich or peanut butter crackers and then supper chicken noodle soup.  Patient asking for another food bag.    Patient reports medications are helping with nausea, heartburn and diarrhea. Reports that she is taking them as prescribed.  Nutrition Focused Physical Exam: deferred today  Medications: magic mouthwash, megace, zofran, compazine, imodium  Labs: glucose 137  Anthropometrics:   Height: 4 ft 11 inches Weight: 148 lb 6. 4 oz UBW: 125-130 lb about 1 year ago BMI: 29  Noted weight on 6/20 149 lb and on 7/9 152 lb  3% weight loss over the last month  Estimated Energy Needs  Kcals: 1675-2000 calories/d Protein: 80-100 g/d Fluid: 2 L/d  NUTRITION DIAGNOSIS: Inadequate food and beverage intake related to treatment side effects (nausea, heartburn, diarrhea) and stress as evidenced by poor po intake and 3% weight loss over the month   MALNUTRITION DIAGNOSIS: continue to monitor   INTERVENTION:   Discussed ways to increase calories and protein.  Encouraged small frequent meals Discussed strategies to help with taste changes.  Fact sheet given Patient with questions regarding medications and discussed with RN, Tillie Rung.  RN to reach out to patient.    Encouraged patient to take medications as prescribed to help relieve symptoms to allow her to eat.  Patient verbalized understanding. Food bag given to patient today, along with sample of ensure/boost supplements and coupons.  Social worker contacted.    MONITORING, EVALUATION, GOAL: Patient will consume adequate calories and protein to meet nutritional needs   NEXT VISIT: Thursday, August 16th after radiation  Ryu Cerreta B. Zenia Resides, [place], [place] Registered Dietitian [phone] (pager)

## 2017-05-25 ENCOUNTER — Ambulatory Visit
Admission: RE | Admit: 2017-05-25 | Discharge: 2017-05-25 | Disposition: A | Discharge status: Home / Self Care | Payer: Medicare Other | Source: Ambulatory Visit | Attending: Radiation Oncology | Admitting: Radiation Oncology

## 2017-05-25 DIAGNOSIS — C539 Malignant neoplasm of cervix uteri, unspecified: Secondary | ICD-10-CM | POA: Diagnosis not present

## 2017-05-28 ENCOUNTER — Ambulatory Visit
Admission: RE | Admit: 2017-05-28 | Discharge: 2017-05-28 | Disposition: A | Discharge status: Home / Self Care | Payer: Medicare Other | Source: Ambulatory Visit | Attending: Radiation Oncology | Admitting: Radiation Oncology

## 2017-05-28 DIAGNOSIS — C539 Malignant neoplasm of cervix uteri, unspecified: Secondary | ICD-10-CM | POA: Diagnosis not present

## 2017-05-29 ENCOUNTER — Encounter (HOSPITAL_COMMUNITY): Payer: Self-pay

## 2017-05-29 ENCOUNTER — Ambulatory Visit
Admission: RE | Admit: 2017-05-29 | Discharge: 2017-05-29 | Disposition: A | Discharge status: Home / Self Care | Payer: Medicare Other | Source: Ambulatory Visit | Attending: Radiation Oncology | Admitting: Radiation Oncology

## 2017-05-29 DIAGNOSIS — C539 Malignant neoplasm of cervix uteri, unspecified: Secondary | ICD-10-CM | POA: Diagnosis not present

## 2017-05-29 NOTE — Progress Notes (Signed)
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   INTERVAL HISTORY: Patient returns to clinic today for further evaluation and consideration of cycle 4 of weekly cisplatin. She complains of persistent nauseous and vomiting. She also has chronic weakness and fatigue. She continues to be highly anxious. She has no neurologic complaints. She denies any recent fevers or illnesses. She denies any chest pain or shortness of breath. She has no constipation or diarrhea. She denies any melena or hematochezia. She has no urinary complaints. Patient offers no further specific complaints today.  REVIEW OF SYSTEMS:   Review of Systems  Constitutional: Positive for malaise/fatigue and weight loss. Negative for fever.       Lack of appetite  Eyes: Negative.   Respiratory: Negative.  Negative for cough and shortness of breath.   Cardiovascular: Negative.  Negative for chest pain and leg swelling.  Gastrointestinal: Positive for nausea and vomiting. Negative for abdominal pain, blood in stool and melena.  Genitourinary: Negative.  Negative for hematuria.  Musculoskeletal: Negative.   Skin: Negative.  Negative for rash.  Neurological: Positive for weakness. Negative for sensory change.  Psychiatric/Behavioral: The patient is nervous/anxious and has insomnia.     As per HPI. Otherwise, a complete review of systems is negative.  PAST MEDICAL HISTORY: Past Medical History:  Diagnosis Date  . Anxiety   . Arrhythmia   . Arthritis   . Cervical cancer ([place])   . Hernia of abdominal wall 04/17/2017    PAST SURGICAL HISTORY: Past Surgical History:  Procedure Laterality Date  . HIP SURGERY    . TUBAL LIGATION       FAMILY HISTORY: Family History  Problem Relation Age of Onset  . Diabetes Mother     ADVANCED DIRECTIVES (Y/N):  N  HEALTH MAINTENANCE: Social History  Substance Use Topics  . Smoking status: Never Smoker  . Smokeless tobacco: Never Used  . Alcohol use No     Colonoscopy:  PAP:  Bone density:  Lipid panel:  No Known Allergies  Current Outpatient Prescriptions  Medication Sig Dispense Refill  . LORazepam (ATIVAN) 0.5 MG tablet Take 1 tablet (0.5 mg total) by mouth at bedtime as needed for anxiety. 30 tablet 0  . magic mouthwash SOLN Take 5 mLs by mouth 3 (three) times daily. 240 mL 0  . megestrol (MEGACE) 40 MG tablet Take 1 tablet (40 mg total) by mouth daily. 30 tablet 1  . ondansetron (ZOFRAN) 8 MG tablet Take 1 tablet (8 mg total) by mouth 2 (two) times daily as needed. 60 tablet 2  . prochlorperazine (COMPAZINE) 10 MG tablet Take 1 tablet (10 mg total) by mouth every 6 (six) hours as needed (Nausea or vomiting). 60 tablet 2  . diphenoxylate-atropine (LOMOTIL) 2.5-0.025 MG tablet Take 1 tablet by mouth 4 (four) times daily as needed for diarrhea or loose stools. 30 tablet 1  . ondansetron (ZOFRAN-ODT) 8 MG disintegrating tablet Take 1 tablet (8 mg total) by mouth every 8 (eight) hours as needed for nausea or vomiting. 30 tablet 2   No current facility-administered medications for this visit.     OBJECTIVE: Vitals:   05/30/17 0929  BP: (!) 148/91  Pulse: 98  Resp: 18  Temp: (!) 97.2 F (  36.2 C)     Body mass index is 29.29 kg/m.    ECOG FS:0 - Asymptomatic  General: Well-developed, well-nourished, no acute distress. Eyes: Pink conjunctiva, anicteric sclera. Lungs: Clear to auscultation bilaterally. Heart: Regular rate and rhythm. No rubs, murmurs, or gallops. Abdomen: Soft, nontender, nondistended. No organomegaly noted, normoactive bowel sounds. Gyn: Exam recently performed by gynecology oncology. Musculoskeletal: No edema, cyanosis, or clubbing. Neuro:  Alert, answering all questions appropriately. Cranial nerves grossly intact. Skin: No rashes or petechiae noted. Psych: Normal affect.  LAB RESULTS:  Lab Results  Component Value Date   NA 138 05/30/2017   K 3.5 05/30/2017   CL 104 05/30/2017   CO2 22 05/30/2017   GLUCOSE 128 (H) 05/30/2017   BUN 15 05/30/2017   CREATININE 0.55 05/30/2017   CALCIUM 9.8 05/30/2017   GFRNONAA >60 05/30/2017   GFRAA >60 05/30/2017    Lab Results  Component Value Date   WBC 5.3 05/30/2017   NEUTROABS 4.3 05/30/2017   HGB 14.0 05/30/2017   HCT 39.5 05/30/2017   MCV 88.1 05/30/2017   PLT 224 05/30/2017     STUDIES: No results found.  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  PLAN:    1. 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 Patient previously was determined not to be a surgical candidate given the location of her malignancy. Continue daily XRT completing on June 12, 2017. Proceed with cycle 4 of weekly cisplatin. At the conclusion of her radiation and chemotherapy, patient will go to [facility] to receive brachytherapy.  Return to clinic in 1 week for consideration of cycle 5. 2. Weight loss/lack of appetite: Continue Megace 40 mg daily. Patient was also previously given a referral to dietary.  3. Dry Mouth: Continue Magic Mouthwash as needed 4. Nausea and vomiting: Patient states she has difficulty swallowing pills, therefore was given a prescription for ondansetron ODT.  Approximately 30 minutes was spent in discussion of which greater than 50% was consultation.  Patient expressed understanding and was in agreement with this plan. She also understands that She can call clinic at any time with any questions, concerns, or complaints.   Cancer Staging Cervical carcinoma [facility]) Staging form: Cervix Uteri, AJCC 8th Edition - Clinical stage from 04/27/2017: FIGO Stage IIA (cT2a,  cN0, cM0) - Signed by Lloyd Huger, MD on 04/27/2017   Lloyd Huger, MD   06/02/2017 6:47 AM

## 2017-05-29 NOTE — Progress Notes (Signed)
Social worker provided counseling services in the cancer center.

## 2017-05-30 ENCOUNTER — Ambulatory Visit
Admission: RE | Admit: 2017-05-30 | Discharge: 2017-05-30 | Disposition: A | Discharge status: Home / Self Care | Payer: Medicare Other | Source: Ambulatory Visit | Attending: Radiation Oncology | Admitting: Radiation Oncology

## 2017-05-30 ENCOUNTER — Inpatient Hospital Stay: Payer: Medicare Other

## 2017-05-30 ENCOUNTER — Inpatient Hospital Stay (HOSPITAL_BASED_OUTPATIENT_CLINIC_OR_DEPARTMENT_OTHER): Payer: Medicare Other | Admitting: Oncology

## 2017-05-30 VITALS — BP 148/91 | HR 98 | Temp 97.2°F | Resp 18 | Wt 145.0 lb

## 2017-05-30 VITALS — BP 120/78 | Temp 98.3°F

## 2017-05-30 DIAGNOSIS — C539 Malignant neoplasm of cervix uteri, unspecified: Secondary | ICD-10-CM

## 2017-05-30 DIAGNOSIS — R112 Nausea with vomiting, unspecified: Secondary | ICD-10-CM

## 2017-05-30 DIAGNOSIS — R634 Abnormal weight loss: Secondary | ICD-10-CM

## 2017-05-30 DIAGNOSIS — R682 Dry mouth, unspecified: Secondary | ICD-10-CM

## 2017-05-30 DIAGNOSIS — R630 Anorexia: Secondary | ICD-10-CM

## 2017-05-30 DIAGNOSIS — R197 Diarrhea, unspecified: Secondary | ICD-10-CM

## 2017-05-30 DIAGNOSIS — Z79899 Other long term (current) drug therapy: Secondary | ICD-10-CM

## 2017-05-30 DIAGNOSIS — Z5111 Encounter for antineoplastic chemotherapy: Secondary | ICD-10-CM | POA: Diagnosis not present

## 2017-05-30 LAB — CBC WITH DIFFERENTIAL/PLATELET
Basophils Absolute: 0 10*3/uL (ref 0–0.1)
Basophils Relative: 0 %
Eosinophils Absolute: 0.1 10*3/uL (ref 0–0.7)
Eosinophils Relative: 2 %
HEMATOCRIT: 39.5 % (ref 35.0–47.0)
HEMOGLOBIN: 14 g/dL (ref 12.0–16.0)
LYMPHS ABS: 0.5 10*3/uL — AB (ref 1.0–3.6)
LYMPHS PCT: 10 %
MCH: 31.3 pg (ref 26.0–34.0)
MCHC: 35.6 g/dL (ref 32.0–36.0)
MCV: 88.1 fL (ref 80.0–100.0)
MONOS PCT: 7 %
Monocytes Absolute: 0.4 10*3/uL (ref 0.2–0.9)
NEUTROS ABS: 4.3 10*3/uL (ref 1.4–6.5)
NEUTROS PCT: 81 %
Platelets: 224 10*3/uL (ref 150–440)
RBC: 4.48 MIL/uL (ref 3.80–5.20)
RDW: 12 % (ref 11.5–14.5)
WBC: 5.3 10*3/uL (ref 3.6–11.0)

## 2017-05-30 LAB — BASIC METABOLIC PANEL
Anion gap: 12 (ref 5–15)
BUN: 15 mg/dL (ref 6–20)
CHLORIDE: 104 mmol/L (ref 101–111)
CO2: 22 mmol/L (ref 22–32)
CREATININE: 0.55 mg/dL (ref 0.44–1.00)
Calcium: 9.8 mg/dL (ref 8.9–10.3)
GFR calc non Af Amer: >60 mL/min (ref >60)
GLUCOSE: 128 mg/dL — AB (ref 65–99)
Potassium: 3.5 mmol/L (ref 3.5–5.1)
Sodium: 138 mmol/L (ref 135–145)

## 2017-05-30 MED ORDER — DIPHENOXYLATE-ATROPINE 2.5-0.025 MG PO TABS: 1.0000 | ORAL_TABLET | Freq: Four times a day (QID) | ORAL | 1 refills | Status: DC | PRN

## 2017-05-30 MED ORDER — SODIUM CHLORIDE 0.9 % IV SOLN
Freq: Once | INTRAVENOUS | Status: AC
  Administered 2017-05-30: 10:00:00 via INTRAVENOUS
  Filled 2017-05-30: qty 1000

## 2017-05-30 MED ORDER — FOSAPREPITANT DIMEGLUMINE INJECTION 150 MG
Freq: Once | INTRAVENOUS | Status: AC
  Administered 2017-05-30: 13:00:00 via INTRAVENOUS
  Filled 2017-05-30: qty 5

## 2017-05-30 MED ORDER — MAGNESIUM SULFATE 2 GM/50ML IV SOLN
Freq: Once | INTRAVENOUS | Status: AC
  Administered 2017-05-30: 11:00:00 via INTRAVENOUS
  Filled 2017-05-30: qty 1000

## 2017-05-30 MED ORDER — CISPLATIN CHEMO INJECTION 100MG/100ML
40.0000 mg/m2 | Freq: Once | INTRAVENOUS | Status: AC
  Administered 2017-05-30: 66 mg via INTRAVENOUS
  Filled 2017-05-30: qty 66

## 2017-05-30 MED ORDER — ONDANSETRON 8 MG PO TBDP: 8.0000 mg | ORAL_TABLET | Freq: Three times a day (TID) | ORAL | 2 refills | Status: DC | PRN

## 2017-05-30 MED ORDER — PALONOSETRON HCL INJECTION 0.25 MG/5ML
0.2500 mg | Freq: Once | INTRAVENOUS | Status: AC
  Administered 2017-05-30: 0.25 mg via INTRAVENOUS
  Filled 2017-05-30: qty 5

## 2017-05-30 NOTE — Progress Notes (Signed)
Patient is here for follow up, she is not doing well today. She reports nausea and vomiting for the last few days. She is very fatigued.

## 2017-05-31 ENCOUNTER — Ambulatory Visit
Admission: RE | Admit: 2017-05-31 | Discharge: 2017-05-31 | Disposition: A | Discharge status: Home / Self Care | Payer: Medicare Other | Source: Ambulatory Visit | Attending: Radiation Oncology | Admitting: Radiation Oncology

## 2017-05-31 DIAGNOSIS — C539 Malignant neoplasm of cervix uteri, unspecified: Secondary | ICD-10-CM | POA: Diagnosis not present

## 2017-06-01 ENCOUNTER — Ambulatory Visit
Admission: RE | Admit: 2017-06-01 | Discharge: 2017-06-01 | Disposition: A | Discharge status: Home / Self Care | Payer: Medicare Other | Source: Ambulatory Visit | Attending: Radiation Oncology | Admitting: Radiation Oncology

## 2017-06-01 DIAGNOSIS — C539 Malignant neoplasm of cervix uteri, unspecified: Secondary | ICD-10-CM | POA: Diagnosis not present

## 2017-06-04 ENCOUNTER — Ambulatory Visit: Payer: Medicare Other

## 2017-06-04 NOTE — Progress Notes (Addendum)
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   INTERVAL HISTORY: Patient returns to clinic today for concerns of feeling tired, diarrhea, vomiting, and anxiety. She is due for cycle 5 of weekly cisplatin tomorrow. She complains that she is having diarrhea upwards of 10 times a day which started last week after treatment. She has been taking lomotil twice a day but does not think she has taken immodium. She complains of continued nausea and vomiting which she said started many months ago and she feels is related to stress and anxiety. She is taking zofran odt once a day but feels it is helping and has not tried the compazine. She once took nexium for heartburn but had stopped. She did feel it helped her swallowing and nausea. She says that she threw up megace but has had good results with magic mouthwash. She continues to be highly anxious, nervous, and has multiple outside stressors. She continues ativan 0.5 at bedtime. She was previously prescribed Zoloft by her primary care doctor but had taken herself off of it because of concern that she was 'poisoning her body'. She did feel it helped though. She sleeps 4-6 hours a night and reports day time drowsiness, chronic fatigue, and frequent naps. She does not maintain an active lifestyle. Denies neurologic complaints. She denies any recent fevers or illnesses. She denies any chest pain or shortness of breath. She denies any melena or hematochezia. She has no urinary complaints. Patient offers no further specific complaints today.  REVIEW OF SYSTEMS:   Review of Systems  Constitutional: Positive for malaise/fatigue and weight loss. Negative for fever.       Lack of  appetite  Eyes: Negative.   Respiratory: Negative.  Negative for cough and shortness of breath.   Cardiovascular: Negative.  Negative for chest pain and leg swelling.  Gastrointestinal: Positive for diarrhea, heartburn, nausea and vomiting. Negative for abdominal pain, blood in stool and melena.  Genitourinary: Negative.  Negative for hematuria.  Musculoskeletal: Positive for myalgias.  Skin: Negative.  Negative for rash.  Neurological: Positive for weakness. Negative for sensory change.  Psychiatric/Behavioral: The patient is nervous/anxious and has insomnia.     As per HPI. Otherwise, a complete review of systems is negative.  PAST MEDICAL HISTORY: Past Medical History:  Diagnosis Date  . Anxiety   . Arrhythmia   . Arthritis   . Cervical cancer ([place])   . Hernia of abdominal wall 04/17/2017    PAST SURGICAL HISTORY: Past Surgical History:  Procedure Laterality Date  . HIP SURGERY    . TUBAL LIGATION      FAMILY HISTORY: Family History  Problem Relation Age of Onset  . Diabetes Mother     ADVANCED DIRECTIVES (Y/N):  N  HEALTH MAINTENANCE: Social History  Substance Use Topics  . Smoking status: Never Smoker  . Smokeless tobacco: Never Used  . Alcohol use No     Colonoscopy:  PAP:  Bone density:  Lipid panel:  No Known Allergies  Current Outpatient Prescriptions  Medication Sig Dispense Refill  . diphenoxylate-atropine (LOMOTIL) 2.5-0.025 MG tablet Take 1 tablet by mouth 4 (four) times daily as needed for diarrhea or loose stools. 30 tablet 1  . LORazepam (ATIVAN) 0.5 MG tablet Take  1 tablet (0.5 mg total) by mouth at bedtime as needed for anxiety. 30 tablet 0  . magic mouthwash SOLN Take 5 mLs by mouth 3 (three) times daily. 240 mL 0  . megestrol (MEGACE) 40 MG tablet Take 1 tablet (40 mg total) by mouth daily. 30 tablet 1  . ondansetron (ZOFRAN) 8 MG tablet Take 1 tablet (8 mg total) by mouth 2 (two) times daily as needed. 60 tablet 2  . ondansetron  (ZOFRAN-ODT) 8 MG disintegrating tablet Take 1 tablet (8 mg total) by mouth every 8 (eight) hours as needed for nausea or vomiting. 30 tablet 2  . prochlorperazine (COMPAZINE) 10 MG tablet Take 1 tablet (10 mg total) by mouth every 6 (six) hours as needed (Nausea or vomiting). 60 tablet 2  . esomeprazole (NEXIUM) 20 MG capsule Take 1 capsule (20 mg total) by mouth daily at 12 noon. 90 capsule 3   Current Facility-Administered Medications  Medication Dose Route Frequency Provider Last Rate Last Dose  . 0.9 %  sodium chloride infusion   Intravenous Once Verlon Au, NP      . Derrill Memo ON 06/08/2017] 0.9 %  sodium chloride infusion   Intravenous Once Verlon Au, NP      . ondansetron (ZOFRAN) 8 mg, dexamethasone (DECADRON) 10 mg in sodium chloride 0.9 % 50 mL IVPB   Intravenous Once Verlon Au, NP        OBJECTIVE: Vitals:   06/05/17 1052  BP: 121/80  Pulse: (!) 105  Resp: 16  Temp: (!) 97 F (36.1 C)     Body mass index is 28.8 kg/m.    ECOG FS:0 - Asymptomatic  General: Well-developed, well-nourished, no acute distress. Eyes: Pink conjunctiva, anicteric sclera. Lungs: Clear to auscultation bilaterally. Heart: Regular rate and rhythm. No rubs, murmurs, or gallops. Abdomen: Soft, nontender, nondistended. No organomegaly noted, normoactive bowel sounds. Gyn: Exam recently performed by gynecology oncology. Musculoskeletal: No edema, cyanosis, or clubbing. Neuro: Alert, answering all questions appropriately. Cranial nerves grossly intact. Skin: No rashes or petechiae noted. Psych: Normal affect.  LAB RESULTS:  Lab Results  Component Value Date   NA 139 06/05/2017   K 3.4 (L) 06/05/2017   CL 103 06/05/2017   CO2 26 06/05/2017   GLUCOSE 154 (H) 06/05/2017   BUN 17 06/05/2017   CREATININE 0.71 06/05/2017   CALCIUM 9.8 06/05/2017   GFRNONAA >60 06/05/2017   GFRAA >60 06/05/2017    Lab Results  Component Value Date   WBC 5.9 06/05/2017   NEUTROABS 4.7 06/05/2017    HGB 14.4 06/05/2017   HCT 40.4 06/05/2017   MCV 87.3 06/05/2017   PLT 224 06/05/2017     STUDIES: No results found.  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  PLAN:    1. 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 Patient previously was determined not to be a surgical candidate given the location of her malignancy. Continue daily XRT completing on June 13, 2017. Proceed with cycle 5 of weekly cisplatin tomorrow (8/15) and return to clinic in 1 week for consideration of cycle 6 of cisplatin. At the conclusion of her radiation and chemotherapy, patient will go to [facility] to receive brachytherapy.  2. Weight loss/lack of appetite: Continue Megace 40 mg daily. Patient was also previously given a referral to dietary.  3. Dry Mouth: Continue Magic Mouthwash as needed 4. Nausea and vomiting: Patient states she  has difficulty swallowing pills, therefore was given a prescription for ondansetron ODT, re-start nexium 20mg  qd. Will give IV fluids with zofran and decadron today and repeat on Friday.  5. Diarrhea: I suspect this may be related to anxiety but could be worsened by her chemotherapy. Encouraged her to use lomotil and/or immodium to control symptoms. Her potassium is 3.4 which I will continue to monitor.  6. Medication Management- reviewed medications with patients with teach back. Discussed timing of medications and created potential schedule with patient for possible frequency.   Approximately 30 minutes was spent in discussion of which greater than 50% was consultation.  Patient expressed understanding and was in agreement with this plan. She also understands that She can call clinic at any time with any questions, concerns, or complaints.   Cancer Staging Cervical carcinoma [facility]) Staging form: Cervix Uteri, AJCC 8th Edition - Clinical stage from 04/27/2017: FIGO Stage IIA  (cT2a, cN0, cM0) - Signed by Lloyd Huger, MD on 04/27/2017   Beverely Risen. Zenia Resides, NP 06/05/17 12:45PM  Patient was seen and evaluated independently and I agree with the assessment and plan as dictated above.  Lloyd Huger, MD 06/05/17 2:04 PM

## 2017-06-05 ENCOUNTER — Ambulatory Visit
Admission: RE | Admit: 2017-06-05 | Discharge: 2017-06-05 | Disposition: A | Discharge status: Home / Self Care | Payer: Medicare Other | Source: Ambulatory Visit | Attending: Radiation Oncology | Admitting: Radiation Oncology

## 2017-06-05 ENCOUNTER — Inpatient Hospital Stay: Payer: Medicare Other

## 2017-06-05 ENCOUNTER — Inpatient Hospital Stay (HOSPITAL_BASED_OUTPATIENT_CLINIC_OR_DEPARTMENT_OTHER): Payer: Medicare Other | Admitting: Oncology

## 2017-06-05 ENCOUNTER — Encounter: Payer: Self-pay | Admitting: Oncology

## 2017-06-05 VITALS — BP 121/80 | HR 105 | Temp 97.0°F | Resp 16 | Ht 59.0 in | Wt 142.6 lb

## 2017-06-05 DIAGNOSIS — R682 Dry mouth, unspecified: Secondary | ICD-10-CM

## 2017-06-05 DIAGNOSIS — C539 Malignant neoplasm of cervix uteri, unspecified: Secondary | ICD-10-CM

## 2017-06-05 DIAGNOSIS — R112 Nausea with vomiting, unspecified: Secondary | ICD-10-CM

## 2017-06-05 DIAGNOSIS — R197 Diarrhea, unspecified: Secondary | ICD-10-CM

## 2017-06-05 DIAGNOSIS — R634 Abnormal weight loss: Secondary | ICD-10-CM

## 2017-06-05 DIAGNOSIS — G4700 Insomnia, unspecified: Secondary | ICD-10-CM

## 2017-06-05 DIAGNOSIS — Z5111 Encounter for antineoplastic chemotherapy: Secondary | ICD-10-CM | POA: Diagnosis not present

## 2017-06-05 DIAGNOSIS — Z79899 Other long term (current) drug therapy: Secondary | ICD-10-CM | POA: Diagnosis not present

## 2017-06-05 DIAGNOSIS — R630 Anorexia: Secondary | ICD-10-CM

## 2017-06-05 DIAGNOSIS — T451X5A Adverse effect of antineoplastic and immunosuppressive drugs, initial encounter: Secondary | ICD-10-CM

## 2017-06-05 LAB — CBC WITH DIFFERENTIAL/PLATELET
Basophils Absolute: 0 10*3/uL (ref 0–0.1)
Basophils Relative: 0 %
EOS ABS: 0.1 10*3/uL (ref 0–0.7)
Eosinophils Relative: 2 %
HEMATOCRIT: 40.4 % (ref 35.0–47.0)
HEMOGLOBIN: 14.4 g/dL (ref 12.0–16.0)
Lymphocytes Relative: 10 %
Lymphs Abs: 0.6 10*3/uL — ABNORMAL LOW (ref 1.0–3.6)
MCH: 31.1 pg (ref 26.0–34.0)
MCHC: 35.7 g/dL (ref 32.0–36.0)
MCV: 87.3 fL (ref 80.0–100.0)
Monocytes Absolute: 0.5 10*3/uL (ref 0.2–0.9)
Monocytes Relative: 8 %
Neutro Abs: 4.7 10*3/uL (ref 1.4–6.5)
Neutrophils Relative %: 80 %
Platelets: 224 10*3/uL (ref 150–440)
RBC: 4.62 MIL/uL (ref 3.80–5.20)
RDW: 11.5 % (ref 11.5–14.5)
WBC: 5.9 10*3/uL (ref 3.6–11.0)

## 2017-06-05 LAB — BASIC METABOLIC PANEL
Anion gap: 10 (ref 5–15)
BUN: 17 mg/dL (ref 6–20)
CHLORIDE: 103 mmol/L (ref 101–111)
CO2: 26 mmol/L (ref 22–32)
CREATININE: 0.71 mg/dL (ref 0.44–1.00)
Calcium: 9.8 mg/dL (ref 8.9–10.3)
GFR calc Af Amer: >60 mL/min (ref >60)
GFR calc non Af Amer: >60 mL/min (ref >60)
GLUCOSE: 154 mg/dL — AB (ref 65–99)
POTASSIUM: 3.4 mmol/L — AB (ref 3.5–5.1)
Sodium: 139 mmol/L (ref 135–145)

## 2017-06-05 MED ORDER — SODIUM CHLORIDE 0.9 % IV SOLN
Freq: Once | INTRAVENOUS | Status: DC
  Filled 2017-06-05: qty 1000

## 2017-06-05 MED ORDER — ESOMEPRAZOLE MAGNESIUM 20 MG PO CPDR: 20.0000 mg | DELAYED_RELEASE_CAPSULE | Freq: Every day | ORAL | 3 refills | Status: DC

## 2017-06-05 MED ORDER — SODIUM CHLORIDE 0.9 % IV SOLN: Freq: Once | INTRAVENOUS | Status: DC

## 2017-06-05 MED ORDER — SODIUM CHLORIDE 0.9 % IV SOLN
Freq: Once | INTRAVENOUS | Status: AC
  Administered 2017-06-05: 14:00:00 via INTRAVENOUS
  Filled 2017-06-05: qty 4

## 2017-06-05 MED ORDER — SODIUM CHLORIDE 0.9 % IV SOLN
Freq: Once | INTRAVENOUS | Status: AC
  Administered 2017-06-05: 14:00:00 via INTRAVENOUS
  Filled 2017-06-05: qty 1000

## 2017-06-05 NOTE — Progress Notes (Signed)
Patient here for follow up. She has been having diarrhea and vomiting since her last treatment.

## 2017-06-06 ENCOUNTER — Inpatient Hospital Stay: Payer: Medicare Other | Admitting: Oncology

## 2017-06-06 ENCOUNTER — Inpatient Hospital Stay: Payer: Medicare Other

## 2017-06-06 ENCOUNTER — Ambulatory Visit
Admission: RE | Admit: 2017-06-06 | Discharge: 2017-06-06 | Disposition: A | Discharge status: Home / Self Care | Payer: Medicare Other | Source: Ambulatory Visit | Attending: Radiation Oncology | Admitting: Radiation Oncology

## 2017-06-06 VITALS — BP 113/74 | HR 94 | Temp 97.3°F | Resp 20

## 2017-06-06 DIAGNOSIS — Z5111 Encounter for antineoplastic chemotherapy: Secondary | ICD-10-CM | POA: Diagnosis not present

## 2017-06-06 DIAGNOSIS — C539 Malignant neoplasm of cervix uteri, unspecified: Secondary | ICD-10-CM

## 2017-06-06 MED ORDER — SODIUM CHLORIDE 0.9 % IV SOLN
40.0000 mg/m2 | Freq: Once | INTRAVENOUS | Status: AC
  Administered 2017-06-06: 66 mg via INTRAVENOUS
  Filled 2017-06-06: qty 66

## 2017-06-06 MED ORDER — SODIUM CHLORIDE 0.9 % IV SOLN
Freq: Once | INTRAVENOUS | Status: AC
  Administered 2017-06-06: 10:00:00 via INTRAVENOUS
  Filled 2017-06-06: qty 1000

## 2017-06-06 MED ORDER — PALONOSETRON HCL INJECTION 0.25 MG/5ML
0.2500 mg | Freq: Once | INTRAVENOUS | Status: AC
  Administered 2017-06-06: 0.25 mg via INTRAVENOUS
  Filled 2017-06-06: qty 5

## 2017-06-06 MED ORDER — SODIUM CHLORIDE 0.9 % IV SOLN
Freq: Once | INTRAVENOUS | Status: AC
  Administered 2017-06-06: 12:00:00 via INTRAVENOUS
  Filled 2017-06-06: qty 5

## 2017-06-06 MED ORDER — POTASSIUM CHLORIDE 2 MEQ/ML IV SOLN
Freq: Once | INTRAVENOUS | Status: AC
  Administered 2017-06-06: 10:00:00 via INTRAVENOUS
  Filled 2017-06-06: qty 1000

## 2017-06-07 ENCOUNTER — Ambulatory Visit
Admission: RE | Admit: 2017-06-07 | Discharge: 2017-06-07 | Disposition: A | Discharge status: Home / Self Care | Payer: Medicare Other | Source: Ambulatory Visit | Attending: Radiation Oncology | Admitting: Radiation Oncology

## 2017-06-07 ENCOUNTER — Inpatient Hospital Stay: Payer: Medicare Other

## 2017-06-07 DIAGNOSIS — C539 Malignant neoplasm of cervix uteri, unspecified: Secondary | ICD-10-CM | POA: Diagnosis not present

## 2017-06-07 NOTE — Progress Notes (Signed)
Nutrition  Patient did not come to nutrition follow-up appointment today after radiation.  Rescheduled to 8/23 during infusion.    Loray Akard B. Zenia Resides, [place], [place] Registered Dietitian [phone] (pager)

## 2017-06-08 ENCOUNTER — Ambulatory Visit: Payer: Medicare Other

## 2017-06-09 ENCOUNTER — Emergency Department
Admission: EM | Admit: 2017-06-09 | Discharge: 2017-06-09 | Disposition: A | Discharge status: Home / Self Care | Payer: Medicare Other | Attending: Emergency Medicine | Admitting: Emergency Medicine

## 2017-06-09 ENCOUNTER — Encounter: Payer: Self-pay | Admitting: Emergency Medicine

## 2017-06-09 DIAGNOSIS — N390 Urinary tract infection, site not specified: Secondary | ICD-10-CM | POA: Diagnosis not present

## 2017-06-09 DIAGNOSIS — Z79899 Other long term (current) drug therapy: Secondary | ICD-10-CM | POA: Diagnosis not present

## 2017-06-09 DIAGNOSIS — N939 Abnormal uterine and vaginal bleeding, unspecified: Secondary | ICD-10-CM | POA: Diagnosis present

## 2017-06-09 DIAGNOSIS — C539 Malignant neoplasm of cervix uteri, unspecified: Secondary | ICD-10-CM | POA: Diagnosis not present

## 2017-06-09 LAB — CBC
HCT: 38.8 % (ref 35.0–47.0)
Hemoglobin: 13.7 g/dL (ref 12.0–16.0)
MCH: 30.6 pg (ref 26.0–34.0)
MCHC: 35.3 g/dL (ref 32.0–36.0)
MCV: 86.9 fL (ref 80.0–100.0)
Platelets: 188 10*3/uL (ref 150–440)
RBC: 4.46 MIL/uL (ref 3.80–5.20)
RDW: 11.8 % (ref 11.5–14.5)
WBC: 4.2 10*3/uL (ref 3.6–11.0)

## 2017-06-09 LAB — URINALYSIS, COMPLETE (UACMP) WITH MICROSCOPIC
Bilirubin Urine: NEGATIVE
GLUCOSE, UA: NEGATIVE mg/dL
Ketones, ur: NEGATIVE mg/dL
Nitrite: NEGATIVE
PROTEIN: 30 mg/dL — AB
Specific Gravity, Urine: 1.024 (ref 1.005–1.030)
pH: 5 (ref 5.0–8.0)

## 2017-06-09 LAB — COMPREHENSIVE METABOLIC PANEL
ALBUMIN: 4.1 g/dL (ref 3.5–5.0)
ALT: 26 U/L (ref 14–54)
AST: 28 U/L (ref 15–41)
Alkaline Phosphatase: 69 U/L (ref 38–126)
Anion gap: 12 (ref 5–15)
BUN: 14 mg/dL (ref 6–20)
CO2: 26 mmol/L (ref 22–32)
Calcium: 9.3 mg/dL (ref 8.9–10.3)
Chloride: 102 mmol/L (ref 101–111)
Creatinine, Ser: 0.73 mg/dL (ref 0.44–1.00)
GFR calc Af Amer: >60 mL/min (ref >60)
GFR calc non Af Amer: >60 mL/min (ref >60)
GLUCOSE: 137 mg/dL — AB (ref 65–99)
POTASSIUM: 3 mmol/L — AB (ref 3.5–5.1)
SODIUM: 140 mmol/L (ref 135–145)
Total Bilirubin: 0.7 mg/dL (ref 0.3–1.2)
Total Protein: 7.4 g/dL (ref 6.5–8.1)

## 2017-06-09 MED ORDER — FLUCONAZOLE 50 MG PO TABS
150.0000 mg | ORAL_TABLET | Freq: Once | ORAL | Status: AC
  Administered 2017-06-09: 18:00:00 150 mg via ORAL
  Filled 2017-06-09: qty 1

## 2017-06-09 MED ORDER — CEPHALEXIN 500 MG PO CAPS: 500.0000 mg | ORAL_CAPSULE | Freq: Two times a day (BID) | ORAL | 0 refills | Status: DC

## 2017-06-09 MED ORDER — CEPHALEXIN 500 MG PO CAPS
500.0000 mg | ORAL_CAPSULE | Freq: Once | ORAL | Status: AC
  Administered 2017-06-09: 500 mg via ORAL
  Filled 2017-06-09: qty 1

## 2017-06-09 MED ORDER — SODIUM CHLORIDE 0.9 % IV SOLN
1000.0000 mL | Freq: Once | INTRAVENOUS | Status: AC
  Administered 2017-06-09: 1000 mL via INTRAVENOUS

## 2017-06-09 NOTE — ED Provider Notes (Signed)
[facility] Emergency Department Provider Note   ____________________________________________    I have reviewed the triage vital signs and the nursing notes.   HISTORY  Chief Complaint Vaginal Bleeding     HPI Kendra Herring is a 56 y.o. female is currently undergoing treatment for cervical cancer.she reports she gets daily radiation and chemotherapy on Wednesdays.. She has had significant diarrhea since Thursday and feels weak and dehydrated. She also noted over the last day a small amount of blood when she wipes after urinating. She called her oncologist who referred her to the ED. Patient does report some dysuria as well. No nausea currently. No fevers or chills.    Past Medical History:  Diagnosis Date  . Anxiety   . Arrhythmia   . Arthritis   . Cervical cancer ([place])   . Hernia of abdominal wall 04/17/2017    Patient Active Problem List   Diagnosis Date Noted  . Anxiety disorder, unspecified 04/30/2017  . Squamous cell carcinoma of cervix ([place]) 04/30/2017  . Cervical carcinoma ([place]) 04/06/2017  . Hip pain 05/02/2016  . History of total hip arthroplasty 05/02/2016    Past Surgical History:  Procedure Laterality Date  . HIP SURGERY    . TUBAL LIGATION      Prior to Admission medications   Medication Sig Start Date End Date Taking? Authorizing Provider  LORazepam (ATIVAN) 0.5 MG tablet Take 1 tablet (0.5 mg total) by mouth at bedtime as needed for anxiety. 05/18/17  Yes Jacquelin Hawking, NP  magic mouthwash SOLN Take 5 mLs by mouth 3 (three) times daily. 05/16/17  Yes Burns, Wandra Feinstein, NP  ondansetron (ZOFRAN-ODT) 8 MG disintegrating tablet Take 1 tablet (8 mg total) by mouth every 8 (eight) hours as needed for nausea or vomiting. 05/30/17  Yes Lloyd Huger, MD  cephALEXin (KEFLEX) 500 MG capsule Take 1 capsule (500 mg total) by mouth 2 (two) times daily. 06/09/17   Lavonia Drafts, MD  diphenoxylate-atropine (LOMOTIL) 2.5-0.025 MG  tablet Take 1 tablet by mouth 4 (four) times daily as needed for diarrhea or loose stools. 05/30/17   Lloyd Huger, MD  esomeprazole (NEXIUM) 20 MG capsule Take 1 capsule (20 mg total) by mouth daily at 12 noon. 06/05/17   Verlon Au, NP  megestrol (MEGACE) 40 MG tablet Take 1 tablet (40 mg total) by mouth daily. 05/16/17   Jacquelin Hawking, NP  ondansetron (ZOFRAN) 8 MG tablet Take 1 tablet (8 mg total) by mouth 2 (two) times daily as needed. 04/30/17   Lloyd Huger, MD  prochlorperazine (COMPAZINE) 10 MG tablet Take 1 tablet (10 mg total) by mouth every 6 (six) hours as needed (Nausea or vomiting). 04/30/17   Lloyd Huger, MD     Allergies Patient has no known allergies.  Family History  Problem Relation Age of Onset  . Diabetes Mother     Social History Social History  Substance Use Topics  . Smoking status: Never Smoker  . Smokeless tobacco: Never Used  . Alcohol use No    Review of Systems  Constitutional: No fever/chills Eyes: No visual changes.  ENT: No sore throat. Cardiovascular: Denies chest pain. Respiratory: Denies shortness of breath. Gastrointestinal: No abdominal pain.  No nausea, no vomiting.  Positive diarrhea Genitourinary: positive dysuria Musculoskeletal: Negative for back pain. Skin: Negative for rash. Neurological: Negative for headaches    ____________________________________________   PHYSICAL EXAM:  VITAL SIGNS: ED Triage Vitals  Enc Vitals Group  BP 06/09/17 1522 (!) 143/83     Pulse Rate 06/09/17 1522 87     Resp 06/09/17 1522 18     Temp 06/09/17 1522 97.8 F (36.6 C)     Temp Source 06/09/17 1522 Oral     SpO2 06/09/17 1522 96 %     Weight 06/09/17 1507 64.9 kg (143 lb)     Height 06/09/17 1507 1.499 m (4\' 11" )     Head Circumference --      Peak Flow --      Pain Score 06/09/17 1506 5     Pain Loc --      Pain Edu? --      Excl. in [place]? --     Constitutional: Alert and oriented. No acute distress.  Pleasant and interactive Eyes: Conjunctivae are normal.   Nose: No congestion/rhinnorhea. Mouth/Throat: Mucous membranes are moist.    Cardiovascular: Normal rate, regular rhythm. Grossly normal heart sounds.  Good peripheral circulation. Respiratory: Normal respiratory effort.  No retractions. Lungs CTAB. Gastrointestinal: Soft and nontender. No distention.  No CVA tenderness. Genitourinary: significant discomfort with speculum exam, white adherent substance along the vaginal walls is consistent with yeast infection, patient unable to tolerate speculum exam to allow for  Visualization of the Cervix, however no blood in the vaginal vault Musculoskeletal: Warm and well perfused Neurologic:  Normal speech and language. No gross focal neurologic deficits are appreciated.  Skin:  Skin is warm, dry and intact. No rash noted. Psychiatric: Mood and affect are normal. Speech and behavior are normal.  ____________________________________________   LABS (all labs ordered are listed, but only abnormal results are displayed)  Labs Reviewed  COMPREHENSIVE METABOLIC PANEL - Abnormal; Notable for the following:       Result Value   Potassium 3.0 (*)    Glucose, Bld 137 (*)    All other components within normal limits  URINALYSIS, COMPLETE (UACMP) WITH MICROSCOPIC - Abnormal; Notable for the following:    Color, Urine YELLOW (*)    APPearance HAZY (*)    Hgb urine dipstick MODERATE (*)    Protein, ur 30 (*)    Leukocytes, UA MODERATE (*)    Bacteria, UA RARE (*)    Squamous Epithelial / LPF 0-5 (*)    All other components within normal limits  URINE CULTURE  CBC   ____________________________________________  EKG  None ____________________________________________  RADIOLOGY  None ____________________________________________   PROCEDURES  Procedure(s) performed: No    Critical Care performed:No ____________________________________________   INITIAL IMPRESSION / ASSESSMENT  AND PLAN / ED COURSE  Pertinent labs & imaging results that were available during my care of the patient were reviewed by me and considered in my medical decision making (see chart for details).  Patient presents because she is having blood on the tissue after wiping after urinating. Currently receiving radiation treatment and chemotherapy for cervical cancer. She denies heavy vaginal bleeding. We will check urinalysis given her description of dysuria, perform pelvic exam to evaluate the source of bleeding  Urinalysis consistent with UTI, I suspect this is the cause of her bleeding as no blood visualized in the vaginal vault. We'll prescribe antibiotics and have given fluconazole in the ED. I will have her follow-up with her PCP for further evaluation    ____________________________________________   FINAL CLINICAL IMPRESSION(S) / ED DIAGNOSES  Final diagnoses:  Lower urinary tract infection      NEW MEDICATIONS STARTED DURING THIS VISIT:  Discharge Medication List as of 06/09/2017  5:38  PM    START taking these medications   Details  cephALEXin (KEFLEX) 500 MG capsule Take 1 capsule (500 mg total) by mouth 2 (two) times daily., Starting Sat 06/09/2017, Print         Note:  This document was prepared using Dragon voice recognition software and may include unintentional dictation errors.    Lavonia Drafts, MD 06/09/17 [phone]

## 2017-06-09 NOTE — ED Notes (Signed)
Nurse assisted pt with urine collection. Pt had no blood on cleaning herself prior to attempting urine specimen. Pt in NAD at this time.

## 2017-06-09 NOTE — ED Triage Notes (Signed)
Pt stating that her oncologist told her to come in to the ED because it was not normal for her to have bloody vaginal d/c. Pt stating that she has had blood when she wipes with no clots. Pt stating pelvic pressure. Pt stating she also had some right flank sided pain this morning also. Pt has radiation M-F and chemo on Wednesday. Pt stating that she has had a lot of diarrhea for the last 2 days. Pt denying nausea at this time.

## 2017-06-11 ENCOUNTER — Ambulatory Visit: Payer: Medicare Other

## 2017-06-11 LAB — URINE CULTURE

## 2017-06-12 ENCOUNTER — Ambulatory Visit: Payer: Medicare Other

## 2017-06-12 ENCOUNTER — Ambulatory Visit
Admission: RE | Admit: 2017-06-12 | Discharge: 2017-06-12 | Disposition: A | Discharge status: Home / Self Care | Payer: Medicare Other | Source: Ambulatory Visit | Attending: Radiation Oncology | Admitting: Radiation Oncology

## 2017-06-12 DIAGNOSIS — C539 Malignant neoplasm of cervix uteri, unspecified: Secondary | ICD-10-CM | POA: Diagnosis not present

## 2017-06-12 NOTE — Progress Notes (Signed)
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   INTERVAL HISTORY: Patient returns to clinic today for further evaluation and consideration of cycle 6 of weekly cisplatin. She complains of persistent nauseous, vomiting, and diarrhea. She was seen in the ED on 06/09/17 and diagnosed with UTI and yeast infection. She received diflucan in the ED but never picked up and started the antibiotic. She continues to complain of some vaginal spotting since the ED visit and generalized discomfort. She continues to be highly anxious with multiple stressors at home. She has no neurologic complaints. She denies any recent fevers or illnesses. She denies any chest pain or shortness of breath. She has no constipation or diarrhea. She denies any melena or hematochezia. She has no urinary complaints. Patient offers no further specific complaints today.  REVIEW OF SYSTEMS:   Review of Systems  Constitutional: Positive for malaise/fatigue and weight loss. Negative for fever.       Lack of appetite  Eyes: Negative.   Respiratory: Negative.  Negative for cough and shortness of breath.   Cardiovascular: Negative.  Negative for chest pain and leg swelling.  Gastrointestinal: Positive for nausea and vomiting. Negative for abdominal pain, blood in stool and melena.  Genitourinary: Negative.  Negative for hematuria.  Musculoskeletal: Negative.   Skin: Negative.  Negative for rash.  Neurological: Positive for weakness. Negative for sensory change.  Psychiatric/Behavioral: The patient is nervous/anxious and has insomnia.     As per HPI. Otherwise, a complete review of systems is negative.  PAST MEDICAL HISTORY: Past Medical History:    Diagnosis Date  . Anxiety   . Arrhythmia   . Arthritis   . Cervical cancer ([place])   . Hernia of abdominal wall 04/17/2017    PAST SURGICAL HISTORY: Past Surgical History:  Procedure Laterality Date  . HIP SURGERY    . TUBAL LIGATION      FAMILY HISTORY: Family History  Problem Relation Age of Onset  . Diabetes Mother     ADVANCED DIRECTIVES (Y/N):  N  HEALTH MAINTENANCE: Social History  Substance Use Topics  . Smoking status: Never Smoker  . Smokeless tobacco: Never Used  . Alcohol use No     Colonoscopy:  PAP:  Bone density:  Lipid panel:  No Known Allergies  Current Outpatient Prescriptions  Medication Sig Dispense Refill  . diphenoxylate-atropine (LOMOTIL) 2.5-0.025 MG tablet Take 1 tablet by mouth 4 (four) times daily as needed for diarrhea or loose stools. 30 tablet 1  . LORazepam (ATIVAN) 0.5 MG tablet Take 1 tablet (0.5 mg total) by mouth at bedtime as needed for anxiety. 30 tablet 0  . magic mouthwash SOLN Take 5 mLs by mouth 3 (three) times daily. 240 mL 0  . megestrol (MEGACE) 40 MG tablet Take 1 tablet (40 mg total) by mouth daily. 30 tablet 1  . ondansetron (ZOFRAN-ODT) 8 MG disintegrating tablet Take 1 tablet (8 mg total) by mouth every 8 (eight) hours as needed for nausea or vomiting. 30 tablet 2  . cephALEXin (KEFLEX) 500 MG capsule Take 1 capsule (500 mg total) by mouth 2 (two) times daily. (Patient not taking: Reported on 06/14/2017) 14 capsule 0  . esomeprazole (NEXIUM) 20 MG capsule Take 1 capsule (20 mg total)  by mouth daily at 12 noon. (Patient not taking: Reported on 06/14/2017) 90 capsule 3  . prochlorperazine (COMPAZINE) 10 MG tablet Take 1 tablet (10 mg total) by mouth every 6 (six) hours as needed (Nausea or vomiting). (Patient not taking: Reported on 06/14/2017) 60 tablet 2   No current facility-administered medications for this visit.     OBJECTIVE: Vitals:   06/14/17 1115  BP: 123/86  Pulse: 89  Resp: 18  Temp: (!) 97.1 F (36.2 C)      Body mass index is 27.95 kg/m.    ECOG FS:0 - Asymptomatic  General: Well-developed, well-nourished, no acute distress. Eyes: Pink conjunctiva, anicteric sclera. Lungs: Clear to auscultation bilaterally. Heart: Regular rate and rhythm. No rubs, murmurs, or gallops. Abdomen: Soft, nontender, nondistended. No organomegaly noted, normoactive bowel sounds. Gyn: Exam recently performed by gynecology oncology. Musculoskeletal: No edema, cyanosis, or clubbing. Neuro: Alert, answering all questions appropriately. Cranial nerves grossly intact. Skin: No rashes or petechiae noted. Psych: Normal affect.  LAB RESULTS:  Lab Results  Component Value Date   NA 139 06/14/2017   K 2.8 (L) 06/14/2017   CL 103 06/14/2017   CO2 26 06/14/2017   GLUCOSE 141 (H) 06/14/2017   BUN 13 06/14/2017   CREATININE 0.60 06/14/2017   CALCIUM 9.2 06/14/2017   PROT 7.4 06/09/2017   ALBUMIN 4.1 06/09/2017   AST 28 06/09/2017   ALT 26 06/09/2017   ALKPHOS 69 06/09/2017   BILITOT 0.7 06/09/2017   GFRNONAA >60 06/14/2017   GFRAA >60 06/14/2017    Lab Results  Component Value Date   WBC 3.3 (L) 06/14/2017   NEUTROABS 2.6 06/14/2017   HGB 12.6 06/14/2017   HCT 35.2 06/14/2017   MCV 86.3 06/14/2017   PLT 193 06/14/2017     STUDIES: No results found.  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  PLAN:    1. 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 Patient previously was determined not to be a surgical candidate given the location of her malignancy. Continue daily XRT completing on June 15, 2017. Due to persistent side effects, will stop the weekly cisplatin. She has an appointment at [facility] on 06/20/17 to discuss brachytherapy. Return to clinic in 1 month for repeat laboratory work and further evaluation. 2. Weight loss/lack of appetite: Continue Megace 40 mg daily. Patient was also  previously given a referral to dietary. Suspect that stress at home and underlying anxiety may be contributing to her symptoms as well.  3. Dry Mouth: Continue Magic Mouthwash as needed 4. Nausea and vomiting: Patient states she has difficulty swallowing pills and keeping pills down. Continue ondansteron odt.  5. Hypokalemia- encouraged potassium rich foods. Will give 31mEq IV KCl today and plan to re-check labs at next visit. 6. Fatigue/Malaise- will give 1 L NaCl today for hydration. 7. Diarrhea- encouraged her to work on stress management, eating low residue foods, and continue imodium and lomotil prn.  8. UTI- urine culture from 06/09/17 reviewed which suggested recollection. Encouraged patient to start antibiotics as prescribed by EDP on 06/09/17 and if symptoms persist patient to contact primary care.  Approximately 30 minutes was spent in discussion of which greater than 50% was consultation.  Patient expressed understanding and was in agreement with this plan. She also understands that She can call clinic at any time with any questions, concerns, or complaints.   Cancer Staging Cervical carcinoma [facility]) Staging form:  Cervix Uteri, AJCC 8th Edition - Clinical stage from 04/27/2017: FIGO Stage IIA (cT2a, cN0, cM0) - Signed by Lloyd Huger, MD on 04/27/2017   Beverely Risen. Zenia Resides, NP 06/14/17 12:01 PM  Patient was seen and evaluated independently and I agree with the assessment and plan as dictated above.  Lloyd Huger, MD 06/16/17 7:14 AM

## 2017-06-13 ENCOUNTER — Ambulatory Visit: Payer: Medicare Other

## 2017-06-13 ENCOUNTER — Ambulatory Visit
Admission: RE | Admit: 2017-06-13 | Discharge: 2017-06-13 | Disposition: A | Discharge status: Home / Self Care | Payer: Medicare Other | Source: Ambulatory Visit | Attending: Radiation Oncology | Admitting: Radiation Oncology

## 2017-06-13 DIAGNOSIS — C539 Malignant neoplasm of cervix uteri, unspecified: Secondary | ICD-10-CM | POA: Diagnosis not present

## 2017-06-14 ENCOUNTER — Inpatient Hospital Stay: Payer: Medicare Other

## 2017-06-14 ENCOUNTER — Ambulatory Visit: Payer: Medicare Other

## 2017-06-14 ENCOUNTER — Inpatient Hospital Stay (HOSPITAL_BASED_OUTPATIENT_CLINIC_OR_DEPARTMENT_OTHER): Payer: Medicare Other | Admitting: Oncology

## 2017-06-14 ENCOUNTER — Telehealth: Payer: Self-pay | Admitting: *Deleted

## 2017-06-14 VITALS — BP 123/86 | HR 89 | Temp 97.1°F | Resp 18 | Wt 138.4 lb

## 2017-06-14 DIAGNOSIS — Z5111 Encounter for antineoplastic chemotherapy: Secondary | ICD-10-CM | POA: Diagnosis not present

## 2017-06-14 DIAGNOSIS — R112 Nausea with vomiting, unspecified: Secondary | ICD-10-CM | POA: Diagnosis not present

## 2017-06-14 DIAGNOSIS — G4700 Insomnia, unspecified: Secondary | ICD-10-CM | POA: Diagnosis not present

## 2017-06-14 DIAGNOSIS — R197 Diarrhea, unspecified: Secondary | ICD-10-CM | POA: Diagnosis not present

## 2017-06-14 DIAGNOSIS — C539 Malignant neoplasm of cervix uteri, unspecified: Secondary | ICD-10-CM

## 2017-06-14 DIAGNOSIS — R630 Anorexia: Secondary | ICD-10-CM

## 2017-06-14 DIAGNOSIS — F419 Anxiety disorder, unspecified: Secondary | ICD-10-CM

## 2017-06-14 DIAGNOSIS — R634 Abnormal weight loss: Secondary | ICD-10-CM

## 2017-06-14 DIAGNOSIS — R682 Dry mouth, unspecified: Secondary | ICD-10-CM

## 2017-06-14 DIAGNOSIS — N390 Urinary tract infection, site not specified: Secondary | ICD-10-CM

## 2017-06-14 DIAGNOSIS — E876 Hypokalemia: Secondary | ICD-10-CM | POA: Diagnosis not present

## 2017-06-14 DIAGNOSIS — Z79899 Other long term (current) drug therapy: Secondary | ICD-10-CM

## 2017-06-14 LAB — CBC WITH DIFFERENTIAL/PLATELET
BASOS ABS: 0 10*3/uL (ref 0–0.1)
BASOS PCT: 0 %
EOS ABS: 0 10*3/uL (ref 0–0.7)
Eosinophils Relative: 1 %
HCT: 35.2 % (ref 35.0–47.0)
Hemoglobin: 12.6 g/dL (ref 12.0–16.0)
Lymphocytes Relative: 11 %
Lymphs Abs: 0.3 10*3/uL — ABNORMAL LOW (ref 1.0–3.6)
MCH: 30.9 pg (ref 26.0–34.0)
MCHC: 35.8 g/dL (ref 32.0–36.0)
MCV: 86.3 fL (ref 80.0–100.0)
MONO ABS: 0.3 10*3/uL (ref 0.2–0.9)
Monocytes Relative: 9 %
NEUTROS ABS: 2.6 10*3/uL (ref 1.4–6.5)
NEUTROS PCT: 79 %
Platelets: 193 10*3/uL (ref 150–440)
RBC: 4.08 MIL/uL (ref 3.80–5.20)
RDW: 12.2 % (ref 11.5–14.5)
WBC: 3.3 10*3/uL — ABNORMAL LOW (ref 3.6–11.0)

## 2017-06-14 LAB — BASIC METABOLIC PANEL
ANION GAP: 10 (ref 5–15)
BUN: 13 mg/dL (ref 6–20)
CALCIUM: 9.2 mg/dL (ref 8.9–10.3)
CO2: 26 mmol/L (ref 22–32)
CREATININE: 0.6 mg/dL (ref 0.44–1.00)
Chloride: 103 mmol/L (ref 101–111)
Glucose, Bld: 141 mg/dL — ABNORMAL HIGH (ref 65–99)
Potassium: 2.8 mmol/L — ABNORMAL LOW (ref 3.5–5.1)
SODIUM: 139 mmol/L (ref 135–145)

## 2017-06-14 MED ORDER — SODIUM CHLORIDE 0.9 % IV SOLN
Freq: Once | INTRAVENOUS | Status: DC
  Filled 2017-06-14: qty 1000

## 2017-06-14 MED ORDER — POTASSIUM CHLORIDE 2 MEQ/ML IV SOLN
Freq: Once | INTRAVENOUS | Status: AC
  Administered 2017-06-14: 12:00:00 via INTRAVENOUS
  Filled 2017-06-14: qty 1000

## 2017-06-14 NOTE — Progress Notes (Signed)
Nutrition Follow-up:  Nutrition follow-up completed during IV fluids this pm.  Chemotherapy cancelled.  Planning to complete radiation therapy on 8/24 and appointment at [facility] to discuss brachytherapy on 8/29.  Noted patient was seen at ED on 8/18 for UTI and has not started antibiotic per MD note today.    Patient reports nausea, vomiting and diarrhea. Patient anxious during visit today.  Reports she has been able to keep mashed potatoes down but not much else.    Medications: lomotil, magic mouthwash, megace, zofran, compazine, nexium  Labs: K 2.8, glucose 141  Anthropometrics:   Weight decreased to 138 lb 6.4 oz today from 148 lb 6.4 oz on 8/2.  6% weight loss in the last 21 days, significant   NUTRITION DIAGNOSIS: Inadequate food and beverage intake continues   MALNUTRITION DIAGNOSIS: Patient meets criteria for severe malnutrition in context of acute illness as evidenced by weight loss of 6% in the last 3 weeks and consuming < or equal to 50% of energy needs for > or equal to 5 days.  INTERVENTION:   Patient anxious during infusion and nursing having hard time finding vein for patient. Provide fact sheet on nausea and vomiting and diarrhea for patient.   Provided samples of clear liquid supplements for patient to try Encouraged patient to pick up boost/ensure supplements at [facility] that are available for her.  Encouraged her to see Counselor on regular basis (reports she cancelled most recent appointment)    MONITORING, EVALUATION, GOAL: Patient will consume adequate calories and protein to meet nutritional needs   NEXT VISIT: as needed  Bushra Denman B. Zenia Resides, [place], [place] Registered Dietitian [phone] (pager)

## 2017-06-14 NOTE — Telephone Encounter (Signed)
Patient called to report that she is having severe diarrhea after every XRT treatment and she has been vomiting also. She is for her last treatment today and is not sure that she will be able to make it in for her appointment, but will try. I advised if any way possible, she needs to come in so that we can evaluate her, give her medications and perhaps some IV fluids, she said she will do her best to get here.

## 2017-06-14 NOTE — Progress Notes (Signed)
Patient reports nausea, vomiting and diarrhea today.

## 2017-06-15 ENCOUNTER — Ambulatory Visit
Admission: RE | Admit: 2017-06-15 | Discharge: 2017-06-15 | Disposition: A | Discharge status: Home / Self Care | Payer: Medicare Other | Source: Ambulatory Visit | Attending: Radiation Oncology | Admitting: Radiation Oncology

## 2017-06-15 ENCOUNTER — Ambulatory Visit: Payer: Medicare Other

## 2017-06-15 DIAGNOSIS — C539 Malignant neoplasm of cervix uteri, unspecified: Secondary | ICD-10-CM | POA: Diagnosis not present

## 2017-06-18 ENCOUNTER — Ambulatory Visit: Payer: Medicare Other

## 2017-06-19 ENCOUNTER — Ambulatory Visit: Payer: Medicare Other

## 2017-06-20 ENCOUNTER — Ambulatory Visit: Payer: Medicare Other

## 2017-06-21 ENCOUNTER — Ambulatory Visit
Admission: RE | Admit: 2017-06-21 | Discharge: 2017-06-21 | Disposition: A | Discharge status: Home / Self Care | Payer: Medicare Other | Source: Ambulatory Visit | Attending: Radiation Oncology | Admitting: Radiation Oncology

## 2017-06-21 DIAGNOSIS — C539 Malignant neoplasm of cervix uteri, unspecified: Secondary | ICD-10-CM | POA: Diagnosis not present

## 2017-07-17 NOTE — Progress Notes (Deleted)
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   INTERVAL HISTORY: Patient returns to clinic today for further evaluation and consideration of cycle 4 of weekly cisplatin. She complains of persistent nauseous and vomiting. She also has chronic weakness and fatigue. She continues to be highly anxious. She has no neurologic complaints. She denies any recent fevers or illnesses. She denies any chest pain or shortness of breath. She has no constipation or diarrhea. She denies any melena or hematochezia. She has no urinary complaints. Patient offers no further specific complaints today.  REVIEW OF SYSTEMS:   Review of Systems  Constitutional: Positive for malaise/fatigue and weight loss. Negative for fever.       Lack of appetite  Eyes: Negative.   Respiratory: Negative.  Negative for cough and shortness of breath.   Cardiovascular: Negative.  Negative for chest pain and leg swelling.  Gastrointestinal: Positive for nausea and vomiting. Negative for abdominal pain, blood in stool and melena.  Genitourinary: Negative.  Negative for hematuria.  Musculoskeletal: Negative.   Skin: Negative.  Negative for rash.  Neurological: Positive for weakness. Negative for sensory change.  Psychiatric/Behavioral: The patient is nervous/anxious and has insomnia.     As per HPI. Otherwise, a complete review of systems is negative.  PAST MEDICAL HISTORY: Past Medical History:  Diagnosis Date  . Anxiety   . Arrhythmia   . Arthritis   . Cervical cancer ([place])   . Hernia of abdominal wall 04/17/2017    PAST SURGICAL HISTORY: Past Surgical History:  Procedure Laterality Date  . HIP SURGERY    . TUBAL LIGATION       FAMILY HISTORY: Family History  Problem Relation Age of Onset  . Diabetes Mother     ADVANCED DIRECTIVES (Y/N):  N  HEALTH MAINTENANCE: Social History  Substance Use Topics  . Smoking status: Never Smoker  . Smokeless tobacco: Never Used  . Alcohol use No     Colonoscopy:  PAP:  Bone density:  Lipid panel:  No Known Allergies  Current Outpatient Prescriptions  Medication Sig Dispense Refill  . cephALEXin (KEFLEX) 500 MG capsule Take 1 capsule (500 mg total) by mouth 2 (two) times daily. (Patient not taking: Reported on 06/14/2017) 14 capsule 0  . diphenoxylate-atropine (LOMOTIL) 2.5-0.025 MG tablet Take 1 tablet by mouth 4 (four) times daily as needed for diarrhea or loose stools. 30 tablet 1  . esomeprazole (NEXIUM) 20 MG capsule Take 1 capsule (20 mg total) by mouth daily at 12 noon. (Patient not taking: Reported on 06/14/2017) 90 capsule 3  . LORazepam (ATIVAN) 0.5 MG tablet Take 1 tablet (0.5 mg total) by mouth at bedtime as needed for anxiety. 30 tablet 0  . magic mouthwash SOLN Take 5 mLs by mouth 3 (three) times daily. 240 mL 0  . megestrol (MEGACE) 40 MG tablet Take 1 tablet (40 mg total) by mouth daily. 30 tablet 1  . ondansetron (ZOFRAN-ODT) 8 MG disintegrating tablet Take 1 tablet (8 mg total) by mouth every 8 (eight) hours as needed for nausea or vomiting. 30 tablet 2  . prochlorperazine (COMPAZINE) 10 MG tablet Take 1 tablet (10 mg total) by mouth every 6 (six) hours as needed (Nausea or vomiting). (Patient not taking: Reported on  06/14/2017) 60 tablet 2   No current facility-administered medications for this visit.     OBJECTIVE: There were no vitals filed for this visit.   There is no height or weight on file to calculate BMI.    ECOG FS:0 - Asymptomatic  General: Well-developed, well-nourished, no acute distress. Eyes: Pink conjunctiva, anicteric sclera. Lungs: Clear to auscultation bilaterally. Heart: Regular rate and rhythm. No rubs, murmurs, or  gallops. Abdomen: Soft, nontender, nondistended. No organomegaly noted, normoactive bowel sounds. Gyn: Exam recently performed by gynecology oncology. Musculoskeletal: No edema, cyanosis, or clubbing. Neuro: Alert, answering all questions appropriately. Cranial nerves grossly intact. Skin: No rashes or petechiae noted. Psych: Normal affect.  LAB RESULTS:  Lab Results  Component Value Date   NA 139 06/14/2017   K 2.8 (L) 06/14/2017   CL 103 06/14/2017   CO2 26 06/14/2017   GLUCOSE 141 (H) 06/14/2017   BUN 13 06/14/2017   CREATININE 0.60 06/14/2017   CALCIUM 9.2 06/14/2017   PROT 7.4 06/09/2017   ALBUMIN 4.1 06/09/2017   AST 28 06/09/2017   ALT 26 06/09/2017   ALKPHOS 69 06/09/2017   BILITOT 0.7 06/09/2017   GFRNONAA >60 06/14/2017   GFRAA >60 06/14/2017    Lab Results  Component Value Date   WBC 3.3 (L) 06/14/2017   NEUTROABS 2.6 06/14/2017   HGB 12.6 06/14/2017   HCT 35.2 06/14/2017   MCV 86.3 06/14/2017   PLT 193 06/14/2017     STUDIES: No results found.  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  PLAN:    1. 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 Patient previously was determined not to be a surgical candidate given the location of her malignancy. Continue daily XRT completing on June 12, 2017. Proceed with cycle 4 of weekly cisplatin. At the conclusion of her radiation and chemotherapy, patient will go to [facility] to receive brachytherapy.  Return to clinic in 1 week for consideration of cycle 5. 2. Weight loss/lack of appetite: Continue Megace 40 mg daily. Patient was also previously given a referral to dietary.  3. Dry Mouth: Continue Magic Mouthwash as needed 4. Nausea and vomiting: Patient states she has difficulty swallowing pills, therefore was given a prescription for ondansetron ODT.  Approximately 30 minutes was spent in discussion of  which greater than 50% was consultation.  Patient expressed understanding and was in agreement with this plan. She also understands that She can call clinic at any time with any questions, concerns, or complaints.   Cancer Staging Cervical carcinoma [facility]) Staging form: Cervix Uteri, AJCC 8th Edition - Clinical stage from 04/27/2017: FIGO Stage IIA (cT2a, cN0, cM0) - Signed by Lloyd Huger, MD on 04/27/2017   Lloyd Huger, MD   07/17/2017 11:24 PM

## 2017-07-18 ENCOUNTER — Inpatient Hospital Stay: Payer: Medicare Other

## 2017-07-18 ENCOUNTER — Ambulatory Visit: Payer: Medicare Other | Admitting: Radiation Oncology

## 2017-07-18 ENCOUNTER — Inpatient Hospital Stay: Payer: Medicare Other | Admitting: Oncology

## 2017-07-23 NOTE — Progress Notes (Signed)
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   INTERVAL HISTORY: Patient returns to clinic today for further evaluation. She recently completed her brachytherapy at [facility] several days ago. She continues to have pain, but admits this is improved. She also has chronic weakness and fatigue. She continues to be highly anxious. She has no neurologic complaints. She denies any recent fevers or illnesses. She denies any chest pain or shortness of breath. She has no constipation or diarrhea. She denies any melena or hematochezia. She has no urinary complaints. Patient offers no further specific complaints today.  REVIEW OF SYSTEMS:   Review of Systems  Constitutional: Positive for malaise/fatigue. Negative for fever and weight loss.       Lack of appetite  Eyes: Negative.   Respiratory: Negative.  Negative for cough and shortness of breath.   Cardiovascular: Negative.  Negative for chest pain and leg swelling.  Gastrointestinal: Negative.  Negative for abdominal pain, blood in stool, melena, nausea and vomiting.  Genitourinary: Negative.  Negative for hematuria.  Musculoskeletal: Negative.   Skin: Negative.  Negative for rash.  Neurological: Positive for weakness. Negative for sensory change.  Psychiatric/Behavioral: The patient is nervous/anxious and has insomnia.     As per HPI. Otherwise, a complete review of systems is negative.  PAST MEDICAL HISTORY: Past Medical History:  Diagnosis Date  . Anxiety   . Arrhythmia   . Arthritis   . Cervical cancer ([place])   . Hernia of abdominal wall 04/17/2017    PAST SURGICAL HISTORY: Past Surgical History:  Procedure Laterality Date  . HIP SURGERY      . TUBAL LIGATION      FAMILY HISTORY: Family History  Problem Relation Age of Onset  . Diabetes Mother     ADVANCED DIRECTIVES (Y/N):  N  HEALTH MAINTENANCE: Social History  Substance Use Topics  . Smoking status: Never Smoker  . Smokeless tobacco: Never Used  . Alcohol use No     Colonoscopy:  PAP:  Bone density:  Lipid panel:  Allergies  Allergen Reactions  . Pseudoephedrine Palpitations    Current Outpatient Prescriptions  Medication Sig Dispense Refill  . diphenoxylate-atropine (LOMOTIL) 2.5-0.025 MG tablet Take 1 tablet by mouth 4 (four) times daily as needed for diarrhea or loose stools. 30 tablet 1  . ondansetron (ZOFRAN-ODT) 8 MG disintegrating tablet Take 1 tablet (8 mg total) by mouth every 8 (eight) hours as needed for nausea or vomiting. 30 tablet 2  . oxyCODONE (OXY IR/ROXICODONE) 5 MG immediate release tablet take 1 tablet by mouth every 4 hours if needed for pain for UP TO 3 days for CANCER pain  0  . prochlorperazine (COMPAZINE) 10 MG tablet Take 1 tablet (10 mg total) by mouth every 6 (six) hours as needed (Nausea or vomiting). 60 tablet 2  . cephALEXin (KEFLEX) 500 MG capsule Take 1 capsule (500 mg total) by mouth 2 (two) times daily. (Patient not taking: Reported on 06/14/2017) 14 capsule 0  . esomeprazole (NEXIUM) 20 MG capsule Take 1 capsule (20 mg total) by mouth daily at 12 noon. (Patient not taking: Reported on 06/14/2017) 90 capsule 3  . LORazepam (ATIVAN) 0.5 MG tablet Take 1 tablet (0.5 mg total) by mouth at bedtime  as needed for anxiety. (Patient not taking: Reported on 07/26/2017) 30 tablet 0  . magic mouthwash SOLN Take 5 mLs by mouth 3 (three) times daily. (Patient not taking: Reported on 07/26/2017) 240 mL 0  . megestrol (MEGACE) 40 MG tablet Take 1 tablet (40 mg total) by mouth daily. (Patient not taking: Reported on 07/26/2017) 30 tablet 1   No current facility-administered medications for this visit.     OBJECTIVE: Vitals:   07/26/17 1041   BP: 125/68  Pulse: 88  Resp: 18  Temp: 97.6 F (36.4 C)     Body mass index is 28.7 kg/m.    ECOG FS:0 - Asymptomatic  General: Well-developed, well-nourished, no acute distress. Eyes: Pink conjunctiva, anicteric sclera. Lungs: Clear to auscultation bilaterally. Heart: Regular rate and rhythm. No rubs, murmurs, or gallops. Abdomen: Soft, nontender, nondistended. No organomegaly noted, normoactive bowel sounds. Gyn: Exam recently performed by gynecology oncology. Musculoskeletal: No edema, cyanosis, or clubbing. Neuro: Alert, answering all questions appropriately. Cranial nerves grossly intact. Skin: No rashes or petechiae noted. Psych: Normal affect.  LAB RESULTS:  Lab Results  Component Value Date   NA 140 07/26/2017   K 3.5 07/26/2017   CL 106 07/26/2017   CO2 26 07/26/2017   GLUCOSE 125 (H) 07/26/2017   BUN 10 07/26/2017   CREATININE 0.60 07/26/2017   CALCIUM 9.4 07/26/2017   PROT 7.4 06/09/2017   ALBUMIN 4.1 06/09/2017   AST 28 06/09/2017   ALT 26 06/09/2017   ALKPHOS 69 06/09/2017   BILITOT 0.7 06/09/2017   GFRNONAA >60 07/26/2017   GFRAA >60 07/26/2017    Lab Results  Component Value Date   WBC 5.3 07/26/2017   NEUTROABS 4.1 07/26/2017   HGB 11.6 (L) 07/26/2017   HCT 33.0 (L) 07/26/2017   MCV 93.7 07/26/2017   PLT 239 07/26/2017     STUDIES: No results found.  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  PLAN:    1. 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 Patient previously was determined not to be a surgical candidate given the location of her malignancy. Patient completed her concurrent chemotherapy with cisplatin and XRT on June 06, 2017. She completed brachytherapy at [facility] on July 24, 2017. No intervention is needed at this time. Patient reports she has a PET scan scheduled at [facility] in January 2018. She will return to  clinic one to 2 days after her imaging to discuss the results.  2. Weight loss/lack of appetite: Improving. Patient was also previously given a referral to dietary.  3. Dry Mouth: Continue Magic Mouthwash as needed 4. Nausea and vomiting: Patient does not complain of this today. 5. Pain: Continue oxycodone as needed.  Approximately 30 minutes was spent in discussion of which greater than 50% was consultation.  Patient expressed understanding and was in agreement with this plan. She also understands that She can call clinic at any time with any questions, concerns, or complaints.   Cancer Staging Cervical carcinoma [facility]) Staging form: Cervix Uteri, AJCC 8th Edition - Clinical stage from 04/27/2017: FIGO Stage IIA (cT2a, cN0, cM0) - Signed by Lloyd Huger, MD on 04/27/2017   Lloyd Huger, MD   07/27/2017 3:59 PM

## 2017-07-25 ENCOUNTER — Encounter: Payer: Self-pay | Admitting: Family Medicine

## 2017-07-26 ENCOUNTER — Inpatient Hospital Stay: Payer: Medicare Other | Attending: Oncology

## 2017-07-26 ENCOUNTER — Ambulatory Visit: Admission: RE | Admit: 2017-07-26 | Payer: Medicare Other | Source: Ambulatory Visit | Admitting: Radiation Oncology

## 2017-07-26 ENCOUNTER — Inpatient Hospital Stay (HOSPITAL_BASED_OUTPATIENT_CLINIC_OR_DEPARTMENT_OTHER): Payer: Medicare Other | Admitting: Oncology

## 2017-07-26 ENCOUNTER — Inpatient Hospital Stay: Payer: Medicare Other

## 2017-07-26 VITALS — BP 125/68 | HR 88 | Temp 97.6°F | Resp 18 | Wt 142.1 lb

## 2017-07-26 DIAGNOSIS — Z79899 Other long term (current) drug therapy: Secondary | ICD-10-CM

## 2017-07-26 DIAGNOSIS — C539 Malignant neoplasm of cervix uteri, unspecified: Secondary | ICD-10-CM

## 2017-07-26 DIAGNOSIS — R634 Abnormal weight loss: Secondary | ICD-10-CM | POA: Diagnosis not present

## 2017-07-26 DIAGNOSIS — R630 Anorexia: Secondary | ICD-10-CM

## 2017-07-26 DIAGNOSIS — R682 Dry mouth, unspecified: Secondary | ICD-10-CM | POA: Diagnosis not present

## 2017-07-26 DIAGNOSIS — Z9221 Personal history of antineoplastic chemotherapy: Secondary | ICD-10-CM | POA: Insufficient documentation

## 2017-07-26 DIAGNOSIS — Z923 Personal history of irradiation: Secondary | ICD-10-CM | POA: Insufficient documentation

## 2017-07-26 DIAGNOSIS — F419 Anxiety disorder, unspecified: Secondary | ICD-10-CM | POA: Insufficient documentation

## 2017-07-26 LAB — CBC WITH DIFFERENTIAL/PLATELET
Basophils Absolute: 0 10*3/uL (ref 0–0.1)
Basophils Relative: 0 %
EOS ABS: 0.3 10*3/uL (ref 0–0.7)
Eosinophils Relative: 6 %
HEMATOCRIT: 33 % — AB (ref 35.0–47.0)
HEMOGLOBIN: 11.6 g/dL — AB (ref 12.0–16.0)
LYMPHS ABS: 0.5 10*3/uL — AB (ref 1.0–3.6)
Lymphocytes Relative: 9 %
MCH: 32.9 pg (ref 26.0–34.0)
MCHC: 35.1 g/dL (ref 32.0–36.0)
MCV: 93.7 fL (ref 80.0–100.0)
MONOS PCT: 8 %
Monocytes Absolute: 0.4 10*3/uL (ref 0.2–0.9)
NEUTROS ABS: 4.1 10*3/uL (ref 1.4–6.5)
NEUTROS PCT: 77 %
Platelets: 239 10*3/uL (ref 150–440)
RBC: 3.52 MIL/uL — ABNORMAL LOW (ref 3.80–5.20)
RDW: 16 % — ABNORMAL HIGH (ref 11.5–14.5)
WBC: 5.3 10*3/uL (ref 3.6–11.0)

## 2017-07-26 LAB — BASIC METABOLIC PANEL
Anion gap: 8 (ref 5–15)
BUN: 10 mg/dL (ref 6–20)
CHLORIDE: 106 mmol/L (ref 101–111)
CO2: 26 mmol/L (ref 22–32)
CREATININE: 0.6 mg/dL (ref 0.44–1.00)
Calcium: 9.4 mg/dL (ref 8.9–10.3)
GFR calc Af Amer: >60 mL/min (ref >60)
GFR calc non Af Amer: >60 mL/min (ref >60)
GLUCOSE: 125 mg/dL — AB (ref 65–99)
Potassium: 3.5 mmol/L (ref 3.5–5.1)
Sodium: 140 mmol/L (ref 135–145)

## 2017-07-26 NOTE — Progress Notes (Signed)
Nutrition Follow-up:  Met with patient briefly before MD visit.  Patient reports she completed brachytherapy last Tuesday at [facility].    Reports lack of appetite due to no taste bit trying to eat more.  Reports that she did eat a fish plate and fries yesterday.  Reports she has trying to drink more fluids because she has been getting dehydrated.  Reports diarrhea multiple times per day.  Patient reports that she is drinking strawberry nutrition shake 1 time per day.   Medications: lomotil, zofran, compazine  Labs: glucose 125  Anthropometrics:   Weight has increased today to 142 lb 1.6 oz from weight of 138 lb 6.4 oz on 8/23.     NUTRITION DIAGNOSIS: Inadequate food and beverage intake improving  MALNUTRITION DIAGNOSIS: Severe malnutrition improved with weight gain   INTERVENTION:   Encouraged patient to eat small frequent meals Discussed foods high in calories and protein. Discussed adequate hydration and provided patient with goal to consume daily to maintain hydration. Encouraged patient to review diarrhea medication instructions.  Fact sheet previously given regarding foods to eat during diarrhea.   Patient reports that she is planning on seeing counselor soon for anxiety.    MONITORING, EVALUATION, GOAL: weight trends, intake   NEXT VISIT: as needed.  Patient has contact information  Kendra Herring, [place], [place] Registered Dietitian [phone] (pager)

## 2017-09-25 ENCOUNTER — Encounter: Payer: Self-pay | Admitting: Emergency Medicine

## 2017-09-25 ENCOUNTER — Emergency Department: Payer: Medicare Other

## 2017-09-25 ENCOUNTER — Emergency Department
Admission: EM | Admit: 2017-09-25 | Discharge: 2017-09-25 | Disposition: A | Discharge status: Home / Self Care | Payer: Medicare Other | Attending: Emergency Medicine | Admitting: Emergency Medicine

## 2017-09-25 DIAGNOSIS — J40 Bronchitis, not specified as acute or chronic: Secondary | ICD-10-CM | POA: Insufficient documentation

## 2017-09-25 DIAGNOSIS — M25511 Pain in right shoulder: Secondary | ICD-10-CM | POA: Insufficient documentation

## 2017-09-25 DIAGNOSIS — R05 Cough: Secondary | ICD-10-CM | POA: Diagnosis present

## 2017-09-25 LAB — CBC WITH DIFFERENTIAL/PLATELET
Basophils Absolute: 0 10*3/uL (ref 0–0.1)
Basophils Relative: 0 %
EOS ABS: 0.2 10*3/uL (ref 0–0.7)
EOS PCT: 3 %
HCT: 38.8 % (ref 35.0–47.0)
HEMOGLOBIN: 13.2 g/dL (ref 12.0–16.0)
LYMPHS ABS: 0.9 10*3/uL — AB (ref 1.0–3.6)
LYMPHS PCT: 12 %
MCH: 31.7 pg (ref 26.0–34.0)
MCHC: 34.1 g/dL (ref 32.0–36.0)
MCV: 92.9 fL (ref 80.0–100.0)
MONOS PCT: 8 %
Monocytes Absolute: 0.6 10*3/uL (ref 0.2–0.9)
Neutro Abs: 5.6 10*3/uL (ref 1.4–6.5)
Neutrophils Relative %: 77 %
PLATELETS: 256 10*3/uL (ref 150–440)
RBC: 4.18 MIL/uL (ref 3.80–5.20)
RDW: 11.9 % (ref 11.5–14.5)
WBC: 7.3 10*3/uL (ref 3.6–11.0)

## 2017-09-25 LAB — BASIC METABOLIC PANEL
Anion gap: 12 (ref 5–15)
BUN: 15 mg/dL (ref 6–20)
CHLORIDE: 102 mmol/L (ref 101–111)
CO2: 24 mmol/L (ref 22–32)
CREATININE: 0.6 mg/dL (ref 0.44–1.00)
Calcium: 9.5 mg/dL (ref 8.9–10.3)
GFR calc Af Amer: >60 mL/min (ref >60)
GFR calc non Af Amer: >60 mL/min (ref >60)
GLUCOSE: 120 mg/dL — AB (ref 65–99)
POTASSIUM: 3.7 mmol/L (ref 3.5–5.1)
SODIUM: 138 mmol/L (ref 135–145)

## 2017-09-25 LAB — TROPONIN I: Troponin I: <0.03 ng/mL (ref <0.03)

## 2017-09-25 MED ORDER — IPRATROPIUM-ALBUTEROL 0.5-2.5 (3) MG/3ML IN SOLN
3.0000 mL | Freq: Once | RESPIRATORY_TRACT | Status: AC
  Administered 2017-09-25: 3 mL via RESPIRATORY_TRACT
  Filled 2017-09-25: qty 3

## 2017-09-25 MED ORDER — IBUPROFEN 100 MG/5ML PO SUSP
600.0000 mg | Freq: Once | ORAL | Status: AC
  Administered 2017-09-25: 600 mg via ORAL

## 2017-09-25 MED ORDER — PREDNISONE 20 MG PO TABS: 60.0000 mg | ORAL_TABLET | Freq: Every day | ORAL | 0 refills | Status: DC

## 2017-09-25 MED ORDER — ALBUTEROL SULFATE HFA 108 (90 BASE) MCG/ACT IN AERS: 2.0000 | INHALATION_SPRAY | Freq: Four times a day (QID) | RESPIRATORY_TRACT | 2 refills | Status: DC | PRN

## 2017-09-25 MED ORDER — PREDNISOLONE SODIUM PHOSPHATE 15 MG/5ML PO SOLN: 60.0000 mg | Freq: Every day | ORAL | 0 refills | Status: AC

## 2017-09-25 MED ORDER — IOPAMIDOL (ISOVUE-370) INJECTION 76%
75.0000 mL | Freq: Once | INTRAVENOUS | Status: AC | PRN
  Administered 2017-09-25: 75 mL via INTRAVENOUS

## 2017-09-25 MED ORDER — IBUPROFEN 600 MG PO TABS
600.0000 mg | ORAL_TABLET | Freq: Once | ORAL | Status: DC
Start: 2017-09-25 — End: 2017-09-25
  Filled 2017-09-25: qty 1

## 2017-09-25 MED ORDER — IBUPROFEN 100 MG/5ML PO SUSP
ORAL | Status: AC
  Administered 2017-09-25: 600 mg via ORAL
  Filled 2017-09-25: qty 30

## 2017-09-25 NOTE — ED Triage Notes (Signed)
Patient presents to the ED with cough and congestion x 2 days.  Patient's voice is very hoarse.  Patient is also complaining of right shoulder pain post fall about 2-3 weeks ago and states shoulder has hurt since that time.  Patient reports she just finished chemotherapy about 1 month ago.  Patient states she finished treatment for cervical cancer.  Patient denies fever.

## 2017-09-25 NOTE — Discharge Instructions (Signed)
Take steroids once a day as prescribed. Use albuterol inhaler 2 puffs every 4-6 hours for shortness of breath or severe cough. Return to the ER for chest pain, worsening shortness of breath, or fever. Follow up with your doctor in 2 days.

## 2017-09-25 NOTE — ED Provider Notes (Signed)
[facility] Emergency Department Provider Note  ____________________________________________  Time seen: Approximately 1:08 PM  I have reviewed the triage vital signs and the nursing notes.   HISTORY  Chief Complaint Shoulder Pain; Cough; and Nasal Congestion   HPI Kendra Herring is a 56 y.o. female with a history of cervical cancer status post chemotherapy which ended a month ago who presents for evaluation of cough, congestion, shortness of breath. Patient reports 2 weeks of a dry cough and congestion. Has had mild cough and shortness of breath. No fever or chills, no hemoptysis, no sore throat. She was told by her oncologist to come to the emergency room to be evaluated for pneumonia. No body aches, no diarrhea, no vomiting, no chest pain. No abdominal pain, no dysuria or hematuria. Patient is also complaining of pain in the right shoulder area that has been constant for 2 weeks since she fell on her shoulder. The pain is 10/10 located in the R anterior shoulder and proximal humerus area worse with movement of the arm.   Past Medical History:  Diagnosis Date  . Anxiety   . Arrhythmia   . Arthritis   . Cervical cancer ([place])   . Hernia of abdominal wall 04/17/2017    Patient Active Problem List   Diagnosis Date Noted  . Anxiety disorder, unspecified 04/30/2017  . 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 Cervical carcinoma ([place]) 04/06/2017  . Hip pain 05/02/2016  . History of total hip arthroplasty 05/02/2016    Past Surgical History:  Procedure Laterality Date  . HIP SURGERY    . TUBAL LIGATION      Prior to Admission medications   Medication Sig Start Date End Date Taking? Authorizing Provider  albuterol (PROVENTIL HFA;VENTOLIN HFA) 108 (90 Base) MCG/ACT inhaler Inhale 2 puffs into the lungs every 6 (six) hours as needed for wheezing or shortness of breath. 09/25/17   Alfred Levins, [place], MD  cephALEXin (KEFLEX) 500 MG capsule Take  1 capsule (500 mg total) by mouth 2 (two) times daily. Patient not taking: Reported on 06/14/2017 06/09/17   Lavonia Drafts, MD  diphenoxylate-atropine (LOMOTIL) 2.5-0.025 MG tablet Take 1 tablet by mouth 4 (four) times daily as needed for diarrhea or loose stools. 05/30/17   Lloyd Huger, MD  esomeprazole (NEXIUM) 20 MG capsule Take 1 capsule (20 mg total) by mouth daily at 12 noon. Patient not taking: Reported on 06/14/2017 06/05/17   Verlon Au, NP  LORazepam (ATIVAN) 0.5 MG tablet Take 1 tablet (0.5 mg total) by mouth at bedtime as needed for anxiety. Patient not taking: Reported on 07/26/2017 05/18/17   Jacquelin Hawking, NP  magic mouthwash SOLN Take 5 mLs by mouth 3 (three) times daily. Patient not taking: Reported on 07/26/2017 05/16/17   Jacquelin Hawking, NP  megestrol (MEGACE) 40 MG tablet Take 1 tablet (40 mg total) by mouth daily. Patient not taking: Reported on 07/26/2017 05/16/17   Jacquelin Hawking, NP  ondansetron (ZOFRAN-ODT) 8 MG disintegrating tablet Take 1 tablet (8 mg total) by mouth every 8 (eight) hours as needed for nausea or vomiting. 05/30/17   Lloyd Huger, MD  oxyCODONE (OXY IR/ROXICODONE) 5 MG immediate release tablet take 1 tablet by mouth every 4 hours if needed for pain for UP TO 3 days for CANCER pain 07/17/17   [provider]  prednisoLONE (ORAPRED) 15 MG/5ML solution Take 20 mLs (60 mg total) by mouth daily for 5 days. 09/25/17 09/30/17  Alfred Levins, [place], MD  prochlorperazine (COMPAZINE) 10 MG tablet Take 1 tablet (10 mg total) by mouth every 6 (six) hours as needed (Nausea or vomiting). 04/30/17   Lloyd Huger, MD    Allergies Pseudoephedrine  Family History  Problem Relation Age of Onset  . Diabetes Mother     Social History Social History   Tobacco Use  . Smoking status: Never Smoker  . Smokeless tobacco: Never Used  Substance Use Topics  . Alcohol use: No  . Drug use: No    Review of Systems  Constitutional: Negative for  fever. Eyes: Negative for visual changes. ENT: Negative for sore throat. Neck: No neck pain  Cardiovascular: Negative for chest pain. Respiratory: +shortness of breath, cough, congestion Gastrointestinal: Negative for abdominal pain, vomiting or diarrhea. Genitourinary: Negative for dysuria. Musculoskeletal: Negative for back pain. + R shoulder pain Skin: Negative for rash. Neurological: Negative for headaches, weakness or numbness. Psych: No SI or HI  ____________________________________________   PHYSICAL EXAM:  VITAL SIGNS: ED Triage Vitals  Enc Vitals Group     BP 09/25/17 1007 124/69     Pulse Rate 09/25/17 1007 99     Resp 09/25/17 1007 18     Temp 09/25/17 1007 98.6 F (37 C)     Temp Source 09/25/17 1007 Oral     SpO2 09/25/17 1007 96 %     Weight 09/25/17 1007 140 lb (63.5 kg)     Height 09/25/17 1007 4\' 11"  (1.499 m)     Head Circumference --      Peak Flow --      Pain Score 09/25/17 1006 9     Pain Loc --      Pain Edu? --      Excl. in [place]? --     Constitutional: Alert and oriented. Well appearing and in no apparent distress. HEENT:      Head: Normocephalic and atraumatic.         Eyes: Conjunctivae are normal. Sclera is non-icteric.       Mouth/Throat: Mucous membranes are moist.       Neck: Supple with no signs of meningismus. Cardiovascular: Regular rate and rhythm. No murmurs, gallops, or rubs. 2+ symmetrical distal pulses are present in all extremities. No JVD. Respiratory: Normal respiratory effort. Lungs are clear to auscultation bilaterally with slightly diminished air movement bilaterally, no crackles or wheezes. Gastrointestinal: Soft, non tender, and non distended with positive bowel sounds. No rebound or guarding. Genitourinary: No CVA tenderness. Musculoskeletal: Nontender with normal range of motion in all extremities. No edema, cyanosis, or erythema of extremities. Neurologic: Normal speech and language. Face is symmetric. Moving all  extremities. No gross focal neurologic deficits are appreciated. Skin: Skin is warm, dry and intact. No rash noted. Psychiatric: Mood and affect are normal. Speech and behavior are normal.  ____________________________________________   LABS (all labs ordered are listed, but only abnormal results are displayed)  Labs Reviewed  CBC WITH DIFFERENTIAL/PLATELET - Abnormal; Notable for the following components:      Result Value   Lymphs Abs 0.9 (*)    All other components within normal limits  BASIC METABOLIC PANEL - Abnormal; Notable for the following components:   Glucose, Bld 120 (*)    All other components within normal limits  CULTURE, BLOOD (ROUTINE X 2)  CULTURE, BLOOD (ROUTINE X 2)  TROPONIN I   ____________________________________________  EKG  ED ECG REPORT I, Rudene Re, the attending physician, personally viewed and interpreted this ECG.  Normal sinus rhythm, rate of 89, normal intervals, normal axis, no ST elevations or depressions. Normal EKG. ____________________________________________  RADIOLOGY  CXR: No acute pneumonia nor pulmonary edema. Subtle nodularity in the right perihilar region could reflect a pulmonary vessel on end but a true nodule is not excluded. Chest CT scanning is recommended to exclude occult malignancy.  XR R shoulder: There is no acute or significant chronic bony abnormality of the right shoulder.  CTA chest: Negative for pulmonary embolus or acute disease. Negative for metastatic disease. Specifically, no pulmonary nodule is identified as questioned on report of plain films of the chest this same day. Fatty infiltration of the liver.  ____________________________________________   PROCEDURES  Procedure(s) performed: None Procedures Critical Care performed:  None ____________________________________________   INITIAL IMPRESSION / ASSESSMENT AND PLAN / ED COURSE  56 y.o. female with a history of cervical cancer status  post chemotherapy which ended a month ago who presents for evaluation of cough, congestion, shortness of breath x 2 days. patient is well-appearing, normal work of breathing, lungs are clear to auscultation with slightly diminished air movement bilaterally for which she will receive a DuoNeb. Differential diagnoses including viral URI, pneumonia. CXR with no infiltrate but did show a pulmonary nodule and since patient has h/o cancer will get CT chest to eval for metastatic disease. Will give contrast to evaluate for PE. No flu like sx. XR of the shoulder negative for fracture or dislocation. Will give motrin for pain.    _________________________ 2:09 PM on 09/25/2017 -----------------------------------------  CTA with no evidence of pulmonary nodule, metastatic disease, pneumonia, or pulmonary embolism. The patient feels improved after 1 DuoNeb treatment and has better air movement. Remains extremely well appearing, afebrile with normal vital signs. Patient's condition at the discharge with a diagnosis of bronchitis on prednisone and albuterol nebulizer. Recommend close follow-up with primary care doctor. Discussed return precautions with patient has worse shortness of breath, chest pain, or fever. shoulder pain which was provided to her. Recommend that she takes ibuprofen and heat pads for pain.   As part of my medical decision making, I reviewed the following data within the [place] notes reviewed and incorporated, Labs reviewed , EKG interpreted , Old chart reviewed, Radiograph reviewed , Notes from prior ED visits and [place] Controlled Substance Database    Pertinent labs & imaging results that were available during my care of the patient were reviewed by me and considered in my medical decision making (see chart for details).    ____________________________________________   FINAL CLINICAL IMPRESSION(S) / ED DIAGNOSES  Final diagnoses:  Bronchitis  Acute pain of  right shoulder      NEW MEDICATIONS STARTED DURING THIS VISIT:  ED Discharge Orders        Ordered    predniSONE (DELTASONE) 20 MG tablet  Daily,   Status:  Discontinued     09/25/17 1403    albuterol (PROVENTIL HFA;VENTOLIN HFA) 108 (90 Base) MCG/ACT inhaler  Every 6 hours PRN     09/25/17 1403    prednisoLONE (ORAPRED) 15 MG/5ML solution  Daily     09/25/17 1405       Note:  This document was prepared using Dragon voice recognition software and may include unintentional dictation errors.    Rudene Re, MD 09/25/17 [phone]

## 2017-09-25 NOTE — ED Notes (Signed)

## 2017-09-30 LAB — CULTURE, BLOOD (ROUTINE X 2)
CULTURE: NO GROWTH
Culture: NO GROWTH
SPECIAL REQUESTS: ADEQUATE
SPECIAL REQUESTS: ADEQUATE

## 2017-11-04 NOTE — Progress Notes (Signed)
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   INTERVAL HISTORY: Patient returns to clinic today for discussion of her imaging results and further evaluation. She recently had a PET scan completed [facility] on November 07, 2017.  She currently feels well and is asymptomatic.  She does not complain of weakness or fatigue today.  She does not complain of any pain. She continues to be highly anxious. She has no neurologic complaints. She denies any recent fevers or illnesses. She denies any chest pain or shortness of breath. She has no nausea, vomiting, constipation, or diarrhea. She denies any melena or hematochezia. She has no urinary complaints. Patient offers no specific complaints today.  REVIEW OF SYSTEMS:   Review of Systems  Constitutional: Negative.  Negative for fever, malaise/fatigue and weight loss.  Eyes: Negative.   Respiratory: Negative.  Negative for cough and shortness of breath.   Cardiovascular: Negative.  Negative for chest pain and leg swelling.  Gastrointestinal: Negative.  Negative for abdominal pain, blood in stool, melena, nausea and vomiting.  Genitourinary: Negative.  Negative for hematuria.  Musculoskeletal: Negative.   Skin: Negative.  Negative for rash.  Neurological: Negative for sensory change and weakness.  Psychiatric/Behavioral: The patient is nervous/anxious. The patient does not have insomnia.     As per HPI. Otherwise, a complete review of systems is negative.  PAST MEDICAL HISTORY: Past Medical History:  Diagnosis Date  . Anxiety   . Arrhythmia   . Arthritis   . Cervical cancer ([place])   . Hernia of abdominal wall 04/17/2017    PAST SURGICAL HISTORY: Past  Surgical History:  Procedure Laterality Date  . HIP SURGERY    . TUBAL LIGATION      FAMILY HISTORY: Family History  Problem Relation Age of Onset  . Diabetes Mother     ADVANCED DIRECTIVES (Y/N):  N  HEALTH MAINTENANCE: Social History   Tobacco Use  . Smoking status: Never Smoker  . Smokeless tobacco: Never Used  Substance Use Topics  . Alcohol use: No  . Drug use: No     Colonoscopy:  PAP:  Bone density:  Lipid panel:  Allergies  Allergen Reactions  . Pseudoephedrine Palpitations    Current Outpatient Medications  Medication Sig Dispense Refill  . albuterol (PROVENTIL HFA;VENTOLIN HFA) 108 (90 Base) MCG/ACT inhaler Inhale 2 puffs into the lungs every 6 (six) hours as needed for wheezing or shortness of breath. (Patient not taking: Reported on 11/08/2017) 1 Inhaler 2  . cephALEXin (KEFLEX) 500 MG capsule Take 1 capsule (500 mg total) by mouth 2 (two) times daily. (Patient not taking: Reported on 06/14/2017) 14 capsule 0  . diphenoxylate-atropine (LOMOTIL) 2.5-0.025 MG tablet Take 1 tablet by mouth 4 (four) times daily as needed for diarrhea or loose stools. (Patient not taking: Reported on 11/08/2017) 30 tablet 1  . esomeprazole (NEXIUM) 20 MG capsule Take 1 capsule (20 mg total) by mouth daily at 12 noon. (Patient not taking: Reported on 06/14/2017) 90 capsule 3  . LORazepam (ATIVAN) 0.5 MG tablet Take 1 tablet (0.5 mg total) by mouth at bedtime as needed for anxiety. 30 tablet 0  . magic mouthwash SOLN Take 5 mLs by mouth 3 (three) times daily. (Patient not taking: Reported  on 07/26/2017) 240 mL 0  . megestrol (MEGACE) 40 MG tablet Take 1 tablet (40 mg total) by mouth daily. (Patient not taking: Reported on 07/26/2017) 30 tablet 1  . ondansetron (ZOFRAN-ODT) 8 MG disintegrating tablet Take 1 tablet (8 mg total) by mouth every 8 (eight) hours as needed for nausea or vomiting. (Patient not taking: Reported on 11/08/2017) 30 tablet 2  . oxyCODONE (OXY IR/ROXICODONE) 5 MG  immediate release tablet take 1 tablet by mouth every 4 hours if needed for pain for UP TO 3 days for CANCER pain  0  . prochlorperazine (COMPAZINE) 10 MG tablet Take 1 tablet (10 mg total) by mouth every 6 (six) hours as needed (Nausea or vomiting). (Patient not taking: Reported on 11/08/2017) 60 tablet 2   No current facility-administered medications for this visit.     OBJECTIVE: Vitals:   11/08/17 1056  BP: 97/69  Pulse: 87  Resp: 18  Temp: (!) 97.3 F (36.3 C)     Body mass index is 28.48 kg/m.    ECOG FS:0 - Asymptomatic  General: Well-developed, well-nourished, no acute distress. Eyes: Pink conjunctiva, anicteric sclera. Lungs: Clear to auscultation bilaterally. Heart: Regular rate and rhythm. No rubs, murmurs, or gallops. Abdomen: Soft, nontender, nondistended. No organomegaly noted, normoactive bowel sounds. Gyn: Exam recently performed by gynecology oncology. Musculoskeletal: No edema, cyanosis, or clubbing. Neuro: Alert, answering all questions appropriately. Cranial nerves grossly intact. Skin: No rashes or petechiae noted. Psych: Normal affect.  LAB RESULTS:  Lab Results  Component Value Date   NA 139 11/08/2017   K 3.9 11/08/2017   CL 104 11/08/2017   CO2 27 11/08/2017   GLUCOSE 131 (H) 11/08/2017   BUN 21 (H) 11/08/2017   CREATININE 0.64 11/08/2017   CALCIUM 9.8 11/08/2017   PROT 7.4 06/09/2017   ALBUMIN 4.1 06/09/2017   AST 28 06/09/2017   ALT 26 06/09/2017   ALKPHOS 69 06/09/2017   BILITOT 0.7 06/09/2017   GFRNONAA >60 11/08/2017   GFRAA >60 11/08/2017    Lab Results  Component Value Date   WBC 4.8 11/08/2017   NEUTROABS 3.3 11/08/2017   HGB 14.0 11/08/2017   HCT 41.7 11/08/2017   MCV 91.4 11/08/2017   PLT 268 11/08/2017     STUDIES: No results found.  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  PLAN:    1. 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  Patient previously was determined not to be a surgical candidate given the location of her malignancy. Patient completed her concurrent chemotherapy with cisplatin and XRT on June 06, 2017. She completed brachytherapy at [facility] on July 24, 2017. No intervention is needed at this time.  Patient has repeat imaging scheduled at [facility] in approximately 6 months.  Patient will return to clinic 1-2 days later for further evaluation and discussion of the results.   2. Weight loss/lack of appetite: Resolved.  3.  Anxiety: Patient was given a one-month prescription of Ativan and instructed that she needs to get any additional refills from her primary care physician. 4. Pain: Patient does not complain of this today.  Approximately 20 minutes was spent in discussion of which greater than 50% was consultation.  Patient expressed understanding and was in agreement with this plan. She also understands that She can call clinic at any time with any questions, concerns, or complaints.  Cancer Staging Cervical carcinoma [facility]) Staging form: Cervix Uteri, AJCC 8th Edition - Clinical stage from 04/27/2017: FIGO Stage IIA (cT2a, cN0, cM0) - Signed by Lloyd Huger, MD on 04/27/2017   Lloyd Huger, MD   11/11/2017 11:43 AM

## 2017-11-08 ENCOUNTER — Other Ambulatory Visit: Payer: Self-pay

## 2017-11-08 ENCOUNTER — Ambulatory Visit
Admission: RE | Admit: 2017-11-08 | Discharge: 2017-11-08 | Disposition: A | Discharge status: Home / Self Care | Payer: Medicare Other | Source: Ambulatory Visit | Attending: Radiation Oncology | Admitting: Radiation Oncology

## 2017-11-08 ENCOUNTER — Inpatient Hospital Stay: Payer: Medicare Other | Attending: Oncology

## 2017-11-08 ENCOUNTER — Encounter: Payer: Self-pay | Admitting: Radiation Oncology

## 2017-11-08 ENCOUNTER — Inpatient Hospital Stay (HOSPITAL_BASED_OUTPATIENT_CLINIC_OR_DEPARTMENT_OTHER): Payer: Medicare Other | Admitting: Oncology

## 2017-11-08 VITALS — BP 97/69 | HR 87 | Temp 97.3°F | Wt 142.0 lb

## 2017-11-08 VITALS — BP 97/69 | HR 87 | Temp 97.3°F | Resp 18 | Wt 141.0 lb

## 2017-11-08 DIAGNOSIS — C539 Malignant neoplasm of cervix uteri, unspecified: Secondary | ICD-10-CM | POA: Insufficient documentation

## 2017-11-08 DIAGNOSIS — F419 Anxiety disorder, unspecified: Secondary | ICD-10-CM

## 2017-11-08 DIAGNOSIS — R350 Frequency of micturition: Secondary | ICD-10-CM | POA: Insufficient documentation

## 2017-11-08 DIAGNOSIS — Z9221 Personal history of antineoplastic chemotherapy: Secondary | ICD-10-CM

## 2017-11-08 DIAGNOSIS — M25511 Pain in right shoulder: Secondary | ICD-10-CM | POA: Insufficient documentation

## 2017-11-08 DIAGNOSIS — Z923 Personal history of irradiation: Secondary | ICD-10-CM | POA: Diagnosis not present

## 2017-11-08 LAB — CBC WITH DIFFERENTIAL/PLATELET
BASOS PCT: 0 %
Basophils Absolute: 0 10*3/uL (ref 0–0.1)
EOS ABS: 0.2 10*3/uL (ref 0–0.7)
EOS PCT: 4 %
HCT: 41.7 % (ref 35.0–47.0)
HEMOGLOBIN: 14 g/dL (ref 12.0–16.0)
Lymphocytes Relative: 18 %
Lymphs Abs: 0.9 10*3/uL — ABNORMAL LOW (ref 1.0–3.6)
MCH: 30.7 pg (ref 26.0–34.0)
MCHC: 33.5 g/dL (ref 32.0–36.0)
MCV: 91.4 fL (ref 80.0–100.0)
Monocytes Absolute: 0.4 10*3/uL (ref 0.2–0.9)
Monocytes Relative: 8 %
NEUTROS PCT: 70 %
Neutro Abs: 3.3 10*3/uL (ref 1.4–6.5)
PLATELETS: 268 10*3/uL (ref 150–440)
RBC: 4.56 MIL/uL (ref 3.80–5.20)
RDW: 12.5 % (ref 11.5–14.5)
WBC: 4.8 10*3/uL (ref 3.6–11.0)

## 2017-11-08 LAB — BASIC METABOLIC PANEL
Anion gap: 8 (ref 5–15)
BUN: 21 mg/dL — ABNORMAL HIGH (ref 6–20)
CALCIUM: 9.8 mg/dL (ref 8.9–10.3)
CO2: 27 mmol/L (ref 22–32)
CREATININE: 0.64 mg/dL (ref 0.44–1.00)
Chloride: 104 mmol/L (ref 101–111)
GFR calc non Af Amer: >60 mL/min (ref >60)
Glucose, Bld: 131 mg/dL — ABNORMAL HIGH (ref 65–99)
Potassium: 3.9 mmol/L (ref 3.5–5.1)
SODIUM: 139 mmol/L (ref 135–145)

## 2017-11-08 MED ORDER — LORAZEPAM 0.5 MG PO TABS: 0.5000 mg | ORAL_TABLET | Freq: Every evening | ORAL | 0 refills | Status: DC | PRN

## 2017-11-08 NOTE — Progress Notes (Signed)
Radiation Oncology Follow up Note  Name: Kendra Herring   Date:   11/08/2017 MRN:  [id number] DOB: 18-Oct-1961    This 57 y.o. female presents to the clinic today for 4.5 month follow-up status post concurrent chemoradiation therapy including external beam treatment as well as brachytherapy delivered at [facility] for stage IIa squamous cell carcinoma the cervix  REFERRING PROVIDER: Letta Median, MD  HPI: Patient is a 57 year old female now out 4.5 months having completed concurrent chemoradiation with both external beam radiation therapy as well as brachytherapy delivered at [facility] for stage IIa squamous cell carcinoma the cervix.. She is seen today in routine follow-up and is doing well specifically denies diarrhea dysuria or vaginal discharge. She does have some urinary frequency and has been given oxybutynin although she's not filled that prescription yet. She had a fall and is hurt her right shoulder was evaluated in the emergency room with no fracture appreciated she is going to the orthopedic clinic for follow up on that. She did have a PET CT scan performed at [facility] showing no evidence of hypermetabolic activity in the cervix ringing of the regional nodes or distant metastatic disease.      COMPLICATIONS OF TREATMENT: none  FOLLOW UP COMPLIANCE: keeps appointments   PHYSICAL EXAM:  BP 97/69   Pulse 87   Temp (!) 97.3 F (36.3 C)   Wt 141 lb 15.6 oz (64.4 kg)   BMI 28.68 kg/m  Well-developed well-nourished patient in NAD. HEENT reveals PERLA, EOMI, discs not visualized.  Oral cavity is clear. No oral mucosal lesions are identified. Neck is clear without evidence of cervical or supraclavicular adenopathy. Lungs are clear to A&P. Cardiac examination is essentially unremarkable with regular rate and rhythm without murmur rub or thrill. Abdomen is benign with no organomegaly or masses noted. Motor sensory and DTR levels are equal and symmetric in the upper and lower  extremities. Cranial nerves II through XII are grossly intact. Proprioception is intact. No peripheral adenopathy or edema is identified. No motor or sensory levels are noted. Crude visual fields are within normal range.  RADIOLOGY RESULTS: PET results reviewed  PLAN: Present time patient is achieved excellent result from concurrent chemoradiation. I'm please were overall progress. I've asked to see her back in 6 months for follow-up. She continues close follow-up care at [facility] as well as with medical oncology. Patient knows to call with any concerns.  I would like to take this opportunity to thank you for allowing me to participate in the care of your patient.Noreene Filbert, MD

## 2017-11-12 DIAGNOSIS — S46019A Strain of muscle(s) and tendon(s) of the rotator cuff of unspecified shoulder, initial encounter: Secondary | ICD-10-CM | POA: Insufficient documentation

## 2018-02-19 ENCOUNTER — Encounter: Payer: Self-pay | Admitting: *Deleted

## 2018-05-20 ENCOUNTER — Ambulatory Visit: Payer: Medicare Other | Admitting: Oncology

## 2018-05-20 ENCOUNTER — Other Ambulatory Visit: Payer: Medicare Other

## 2018-05-20 ENCOUNTER — Ambulatory Visit: Payer: Medicare Other | Admitting: Radiation Oncology

## 2018-05-27 NOTE — Progress Notes (Signed)
[place]  [phone] [phone]  ID: Kendra Herring OB: Jul 03, 1961  MR#: [id number]  FMB#:[id number]  Patient Care Team: Letta Median, MD as PCP - General (Family Medicine) Clent Jacks, RN as Registered Nurse Theora Gianotti, Venida Jarvis, MD as Referring Physician (Obstetrics and Gynecology) Mellody Drown, MD as Referring Physician (Obstetrics and Gynecology) Noreene Filbert, MD as Referring Physician (Radiation Oncology) Lloyd Huger, MD as Consulting Physician (Oncology) Christel Mormon Marjory Sneddon, MD as Referring Physician (Radiation Oncology)  CHIEF COMPLAINT: 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   INTERVAL HISTORY: Patient returns to clinic today for repeat laboratory work and further evaluation.  She currently feels well and is asymptomatic.  She has chronic weakness and fatigue. She does not complain of any pain. She continues to be highly anxious. She has no neurologic complaints. She denies any recent fevers or illnesses. She denies any chest pain or shortness of breath. She has no nausea, vomiting, constipation, or diarrhea. She denies any melena or hematochezia. She has no urinary complaints.  Patient offers no specific complaints today.  REVIEW OF SYSTEMS:   Review of Systems  Constitutional: Positive for malaise/fatigue. Negative for fever and weight loss.  Eyes: Negative.   Respiratory: Negative.  Negative for cough and shortness of breath.   Cardiovascular: Negative.  Negative for chest pain and leg swelling.  Gastrointestinal: Negative.  Negative for abdominal pain, blood in stool, melena, nausea and vomiting.  Genitourinary: Negative.  Negative for hematuria.  Musculoskeletal: Negative.   Skin: Negative.  Negative for rash.  Neurological: Positive for weakness. Negative for sensory change, focal weakness and headaches.  Psychiatric/Behavioral: The patient is nervous/anxious. The patient does not have  insomnia.     As per HPI. Otherwise, a complete review of systems is negative.  PAST MEDICAL HISTORY: Past Medical History:  Diagnosis Date  . Anxiety   . Arrhythmia   . Arthritis   . Cervical cancer ([place])   . Hernia of abdominal wall 04/17/2017    PAST SURGICAL HISTORY: Past Surgical History:  Procedure Laterality Date  . HIP SURGERY    . TUBAL LIGATION      FAMILY HISTORY: Family History  Problem Relation Age of Onset  . Diabetes Mother     ADVANCED DIRECTIVES (Y/N):  N  HEALTH MAINTENANCE: Social History   Tobacco Use  . Smoking status: Never Smoker  . Smokeless tobacco: Never Used  Substance Use Topics  . Alcohol use: No  . Drug use: No     Colonoscopy:  PAP:  Bone density:  Lipid panel:  Allergies  Allergen Reactions  . Pseudoephedrine Palpitations    Current Outpatient Medications  Medication Sig Dispense Refill  . esomeprazole (NEXIUM) 20 MG capsule Take 1 capsule (20 mg total) by mouth daily at 12 noon. 90 capsule 3  . naproxen sodium (ALEVE) 220 MG tablet Take 220 mg by mouth daily as needed.    Marland Kitchen albuterol (PROVENTIL HFA;VENTOLIN HFA) 108 (90 Base) MCG/ACT inhaler Inhale 2 puffs into the lungs every 6 (six) hours as needed for wheezing or shortness of breath. (Patient not taking: Reported on 11/08/2017) 1 Inhaler 2  . diphenoxylate-atropine (LOMOTIL) 2.5-0.025 MG tablet Take 1 tablet by mouth 4 (four) times daily as needed for diarrhea or loose stools. (Patient not taking: Reported on 11/08/2017) 30 tablet 1   No current facility-administered medications for this visit.     OBJECTIVE: Vitals:   05/30/18 1117 05/30/18 1125  BP: 126/84 126/84  Pulse:  86 86  Resp: 18 18  Temp: (!) 97.5 F (36.4 C) (!) 97.5 F (36.4 C)     Body mass index is 31.18 kg/m.    ECOG FS:0 - Asymptomatic  General: Well-developed, well-nourished, no acute distress. Eyes: Pink conjunctiva, anicteric sclera. HEENT: Normocephalic, moist mucous membranes. Lungs:  Clear to auscultation bilaterally. Heart: Regular rate and rhythm. No rubs, murmurs, or gallops. Abdomen: Soft, nontender, nondistended. No organomegaly noted, normoactive bowel sounds. Musculoskeletal: No edema, cyanosis, or clubbing. Neuro: Alert, answering all questions appropriately. Cranial nerves grossly intact. Skin: No rashes or petechiae noted. Psych: Normal affect.  LAB RESULTS:  Lab Results  Component Value Date   NA 139 05/30/2018   K 3.6 05/30/2018   CL 104 05/30/2018   CO2 24 05/30/2018   GLUCOSE 151 (H) 05/30/2018   BUN 16 05/30/2018   CREATININE 0.71 05/30/2018   CALCIUM 9.7 05/30/2018   PROT 7.4 06/09/2017   ALBUMIN 4.1 06/09/2017   AST 28 06/09/2017   ALT 26 06/09/2017   ALKPHOS 69 06/09/2017   BILITOT 0.7 06/09/2017   GFRNONAA >60 05/30/2018   GFRAA >60 05/30/2018    Lab Results  Component Value Date   WBC 5.8 05/30/2018   NEUTROABS 4.2 05/30/2018   HGB 14.3 05/30/2018   HCT 41.7 05/30/2018   MCV 91.2 05/30/2018   PLT 246 05/30/2018     STUDIES: No results found.  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  PLAN:    1. 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  Patient previously was determined not to be a surgical candidate given the location of her malignancy. Patient completed her concurrent chemotherapy with cisplatin and XRT on June 06, 2017. She completed brachytherapy at [facility] on July 24, 2017.  Patient did not have repeat imaging at [facility], therefore will order CT scan for the next 1 to 2 weeks.  If imaging continues to be negative, patient can follow-up in 6 months with repeat imaging and further evaluation.  If there is any suspicion of recurrence on CT scan patient will follow-up 1 to 2 days later to discuss the results.  Patient states she is no longer followed by  gynecology oncology, therefore she has been instructed to continue routine follow-up for internal exams with her primary care physician. 2.  Anxiety: Continue monitoring and treatment per primary care. 3.  Pain: Patient does not complain of this today.  I spent a total of 20 minutes face-to-face with the patient of which greater than 50% of the visit was spent in counseling and coordination of care as detailed above.   Patient expressed understanding and was in agreement with this plan. She also understands that She can call clinic at any time with any questions, concerns, or complaints.   Cancer Staging Cervical carcinoma [facility]) Staging form: Cervix Uteri, AJCC 8th Edition - Clinical stage from 04/27/2017: FIGO Stage IIA (cT2a, cN0, cM0) - Signed by Lloyd Huger, MD on 04/27/2017   Lloyd Huger, MD   06/02/2018 8:35 AM

## 2018-05-29 ENCOUNTER — Other Ambulatory Visit: Payer: Self-pay | Admitting: *Deleted

## 2018-05-29 DIAGNOSIS — C539 Malignant neoplasm of cervix uteri, unspecified: Secondary | ICD-10-CM

## 2018-05-29 NOTE — Progress Notes (Signed)
b

## 2018-05-30 ENCOUNTER — Inpatient Hospital Stay: Payer: Medicare Other | Attending: Oncology

## 2018-05-30 ENCOUNTER — Inpatient Hospital Stay (HOSPITAL_BASED_OUTPATIENT_CLINIC_OR_DEPARTMENT_OTHER): Payer: Medicare Other | Admitting: Oncology

## 2018-05-30 ENCOUNTER — Ambulatory Visit: Payer: Medicare Other | Admitting: Radiation Oncology

## 2018-05-30 ENCOUNTER — Inpatient Hospital Stay: Payer: Medicare Other

## 2018-05-30 ENCOUNTER — Encounter: Payer: Self-pay | Admitting: Oncology

## 2018-05-30 VITALS — BP 126/84 | HR 86 | Temp 97.5°F | Resp 18 | Wt 154.4 lb

## 2018-05-30 DIAGNOSIS — D4959 Neoplasm of unspecified behavior of other genitourinary organ: Secondary | ICD-10-CM

## 2018-05-30 DIAGNOSIS — F419 Anxiety disorder, unspecified: Secondary | ICD-10-CM | POA: Insufficient documentation

## 2018-05-30 DIAGNOSIS — Z79899 Other long term (current) drug therapy: Secondary | ICD-10-CM | POA: Insufficient documentation

## 2018-05-30 DIAGNOSIS — C539 Malignant neoplasm of cervix uteri, unspecified: Secondary | ICD-10-CM | POA: Diagnosis present

## 2018-05-30 LAB — BASIC METABOLIC PANEL
Anion gap: 11 (ref 5–15)
BUN: 16 mg/dL (ref 6–20)
CO2: 24 mmol/L (ref 22–32)
CREATININE: 0.71 mg/dL (ref 0.44–1.00)
Calcium: 9.7 mg/dL (ref 8.9–10.3)
Chloride: 104 mmol/L (ref 98–111)
GFR calc Af Amer: >60 mL/min (ref >60)
GFR calc non Af Amer: >60 mL/min (ref >60)
Glucose, Bld: 151 mg/dL — ABNORMAL HIGH (ref 70–99)
Potassium: 3.6 mmol/L (ref 3.5–5.1)
SODIUM: 139 mmol/L (ref 135–145)

## 2018-05-30 LAB — CBC WITH DIFFERENTIAL/PLATELET
BASOS ABS: 0 10*3/uL (ref 0–0.1)
Basophils Relative: 0 %
EOS ABS: 0.1 10*3/uL (ref 0–0.7)
EOS PCT: 3 %
HCT: 41.7 % (ref 35.0–47.0)
Hemoglobin: 14.3 g/dL (ref 12.0–16.0)
Lymphocytes Relative: 20 %
Lymphs Abs: 1.1 10*3/uL (ref 1.0–3.6)
MCH: 31.2 pg (ref 26.0–34.0)
MCHC: 34.2 g/dL (ref 32.0–36.0)
MCV: 91.2 fL (ref 80.0–100.0)
Monocytes Absolute: 0.4 10*3/uL (ref 0.2–0.9)
Monocytes Relative: 6 %
Neutro Abs: 4.2 10*3/uL (ref 1.4–6.5)
Neutrophils Relative %: 71 %
PLATELETS: 246 10*3/uL (ref 150–440)
RBC: 4.57 MIL/uL (ref 3.80–5.20)
RDW: 12.9 % (ref 11.5–14.5)
WBC: 5.8 10*3/uL (ref 3.6–11.0)

## 2018-05-30 NOTE — Progress Notes (Signed)
Pt in for follow up, reports had "a small amount of spotting one time last week".

## 2018-06-06 ENCOUNTER — Ambulatory Visit
Admission: RE | Admit: 2018-06-06 | Discharge: 2018-06-06 | Disposition: A | Discharge status: Home / Self Care | Payer: Medicare Other | Source: Ambulatory Visit | Attending: Oncology | Admitting: Oncology

## 2018-06-06 ENCOUNTER — Ambulatory Visit: Payer: Medicare Other | Admitting: Radiation Oncology

## 2018-06-06 ENCOUNTER — Other Ambulatory Visit: Payer: Self-pay

## 2018-06-06 ENCOUNTER — Ambulatory Visit
Admission: RE | Admit: 2018-06-06 | Discharge: 2018-06-06 | Disposition: A | Discharge status: Home / Self Care | Payer: Medicare Other | Source: Ambulatory Visit | Attending: Radiation Oncology | Admitting: Radiation Oncology

## 2018-06-06 DIAGNOSIS — K760 Fatty (change of) liver, not elsewhere classified: Secondary | ICD-10-CM | POA: Diagnosis not present

## 2018-06-06 DIAGNOSIS — C539 Malignant neoplasm of cervix uteri, unspecified: Secondary | ICD-10-CM

## 2018-06-06 DIAGNOSIS — D4959 Neoplasm of unspecified behavior of other genitourinary organ: Secondary | ICD-10-CM | POA: Insufficient documentation

## 2018-06-06 DIAGNOSIS — Z923 Personal history of irradiation: Secondary | ICD-10-CM | POA: Insufficient documentation

## 2018-06-06 DIAGNOSIS — Z8542 Personal history of malignant neoplasm of other parts of uterus: Secondary | ICD-10-CM | POA: Diagnosis not present

## 2018-06-06 DIAGNOSIS — M8448XA Pathological fracture, other site, initial encounter for fracture: Secondary | ICD-10-CM | POA: Insufficient documentation

## 2018-06-06 HISTORY — DX: Unspecified asthma, uncomplicated: J45.909

## 2018-06-06 MED ORDER — IOPAMIDOL (ISOVUE-300) INJECTION 61%
100.0000 mL | Freq: Once | INTRAVENOUS | Status: AC | PRN
  Administered 2018-06-06: 100 mL via INTRAVENOUS

## 2018-06-06 NOTE — Progress Notes (Signed)
Radiation Oncology Follow up Note  Name: Kendra Herring   Date:   06/06/2018 MRN:  [id number] DOB: Apr 18, 1961    This 57 y.o. female presents to the clinic today for 1 year follow-up status post concurrent chemoradiation therapy for stage IIa [place] cell carcinoma the cervix.  REFERRING PROVIDER: Letta Median, MD  HPI: patient is a 57 year old female now out 1 year having completed concurrent chemotherapy and radiation therapy for stage IIa squamous cell carcinoma the cervix. She seen today in routine follow-up is doing well. She specifically denies vaginal discharge any lower urinary tract symptoms or diarrhea.she had a recent CT scan performed today which I've done a preliminary reviewed showing no evidence of disease or metastatic disease.  COMPLICATIONS OF TREATMENT: none  FOLLOW UP COMPLIANCE: keeps appointments   PHYSICAL EXAM:  BP (!) (P) 141/86   Pulse (!) (P) 106   Temp (P) 98.4 F (36.9 C) (Tympanic)   Wt (P) 146 lb 7.9 oz (66.4 kg)   BMI (P) 29.59 kg/m  On speculum examination vaginal vault is clear vaginal apex shows no evidence of disease. Bimanual examination shows no evidence of parametrial mass or evidence any rectal abnormality.Well-developed well-nourished patient in NAD. HEENT reveals PERLA, EOMI, discs not visualized.  Oral cavity is clear. No oral mucosal lesions are identified. Neck is clear without evidence of cervical or supraclavicular adenopathy. Lungs are clear to A&P. Cardiac examination is essentially unremarkable with regular rate and rhythm without murmur rub or thrill. Abdomen is benign with no organomegaly or masses noted. Motor sensory and DTR levels are equal and symmetric in the upper and lower extremities. Cranial nerves II through XII are grossly intact. Proprioception is intact. No peripheral adenopathy or edema is identified. No motor or sensory levels are noted. Crude visual fields are within normal range.  RADIOLOGY RESULTS: CT scan is  reviewed for radiology report following  PLAN: present time she is 1 year out showing no evidence of disease. I'm please were overall progress. She continues close follow-up care with medical oncology. I've asked to see her back in 1 year for follow-up. Should her CT official report show any change in my interpretation will make that available. Patient is to call with any concerns.  I would like to take this opportunity to thank you for allowing me to participate in the care of your patient.Noreene Filbert, MD

## 2018-06-26 ENCOUNTER — Telehealth: Payer: Self-pay | Admitting: *Deleted

## 2018-06-26 NOTE — Telephone Encounter (Signed)
Patient reports that we gave her ativan for her nerves and she would like a refill. It is not on her current medicine list. Las t prescription was in January 2019 for # 30 tabs. Please advise

## 2018-06-26 NOTE — Telephone Encounter (Signed)
My last note recommended she see her PCp for her ongoing anxiety.

## 2018-06-26 NOTE — Telephone Encounter (Signed)
Patient called and left message that she needs a refill , but not what she wanted refilled, I returned call and got her voice mail. I left message for her to return my call

## 2018-06-26 NOTE — Telephone Encounter (Signed)
Call returned to patient to advised to contact PCP for this issue

## 2018-07-12 ENCOUNTER — Other Ambulatory Visit: Payer: Self-pay | Admitting: Family Medicine

## 2018-07-12 DIAGNOSIS — Z1231 Encounter for screening mammogram for malignant neoplasm of breast: Secondary | ICD-10-CM

## 2018-07-15 ENCOUNTER — Ambulatory Visit: Payer: Medicare Other | Admitting: Radiation Oncology

## 2018-08-08 ENCOUNTER — Ambulatory Visit
Admission: RE | Admit: 2018-08-08 | Discharge: 2018-08-08 | Disposition: A | Discharge status: Home / Self Care | Payer: Medicare Other | Source: Ambulatory Visit | Attending: Family Medicine | Admitting: Family Medicine

## 2018-08-08 DIAGNOSIS — Z1231 Encounter for screening mammogram for malignant neoplasm of breast: Secondary | ICD-10-CM | POA: Insufficient documentation

## 2018-08-08 HISTORY — DX: Personal history of antineoplastic chemotherapy: Z92.21

## 2018-08-08 HISTORY — DX: Personal history of irradiation: Z92.3

## 2018-08-16 ENCOUNTER — Telehealth: Payer: Self-pay

## 2018-08-16 NOTE — Telephone Encounter (Signed)
Kendra Herring had been rescheduled a Gyn Onc appointment because she has not been seen in quite some time. She called stating she saw Dr. Christel Mormon 08/14/18 and his follow up is in 6 months. She also sees Dr. Grayland Ormond and Dr. Baruch Gouty. Instructed her to keep all appointments with her oncology/radiation physicians for her surveillance. She does not want to continue to see Gyn Onc. Appointment cancelled at her request. Oncology Nurse Navigator Documentation  Navigator Location: [facility] (08/16/18 1300)   )Navigator Encounter Type: Telephone (08/16/18 1300) Telephone: Lahoma Crocker Call;Incoming Call (08/16/18 1300)                                                  Time Spent with Patient: 15 (08/16/18 1300)

## 2018-08-28 ENCOUNTER — Ambulatory Visit: Payer: Medicare Other

## 2018-11-02 IMAGING — MG MM DIGITAL SCREENING BILAT W/ TOMO W/ CAD
8 of 13 series · 8 of 29 positions shown · non-contrast
Comparison: Previous exam(s).

CLINICAL DATA: Screening.

EXAM:
2D DIGITAL SCREENING BILATERAL MAMMOGRAM WITH CAD AND ADJUNCT TOMO

[R MLO (1 of 2)]
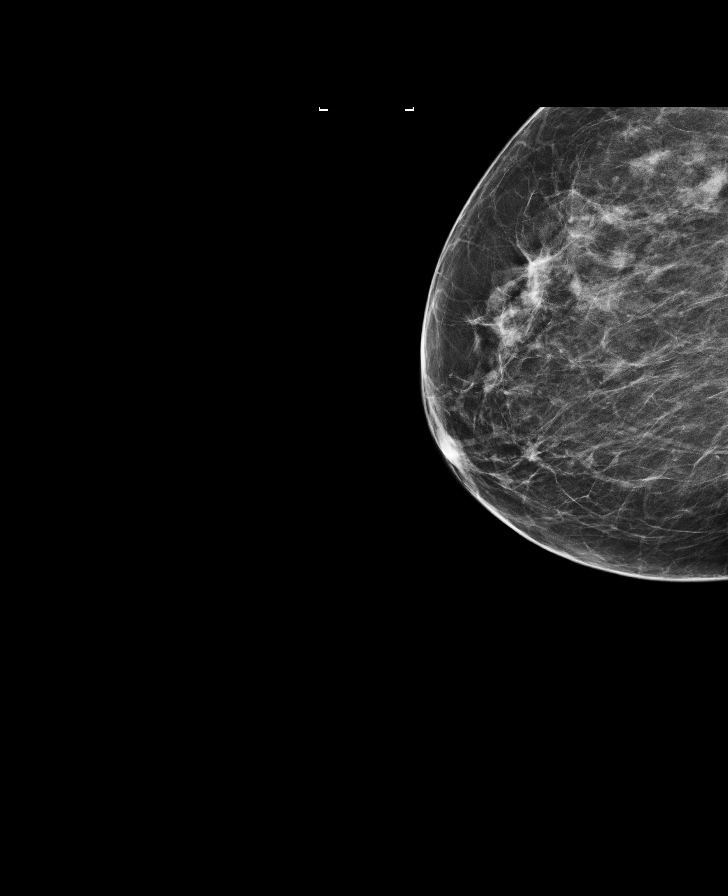

[R CC]
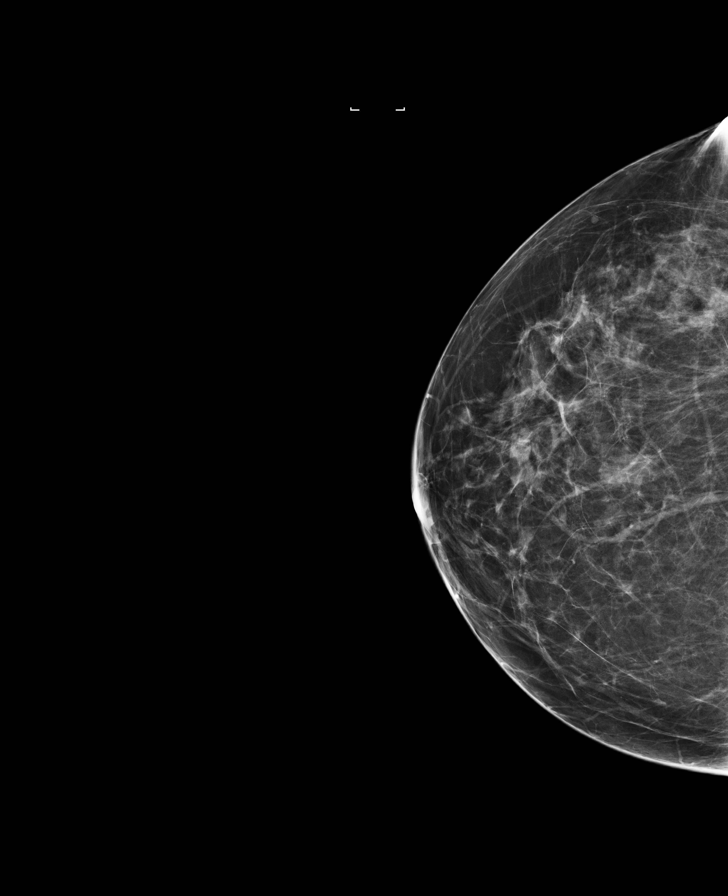

[L MLO synth-2D]
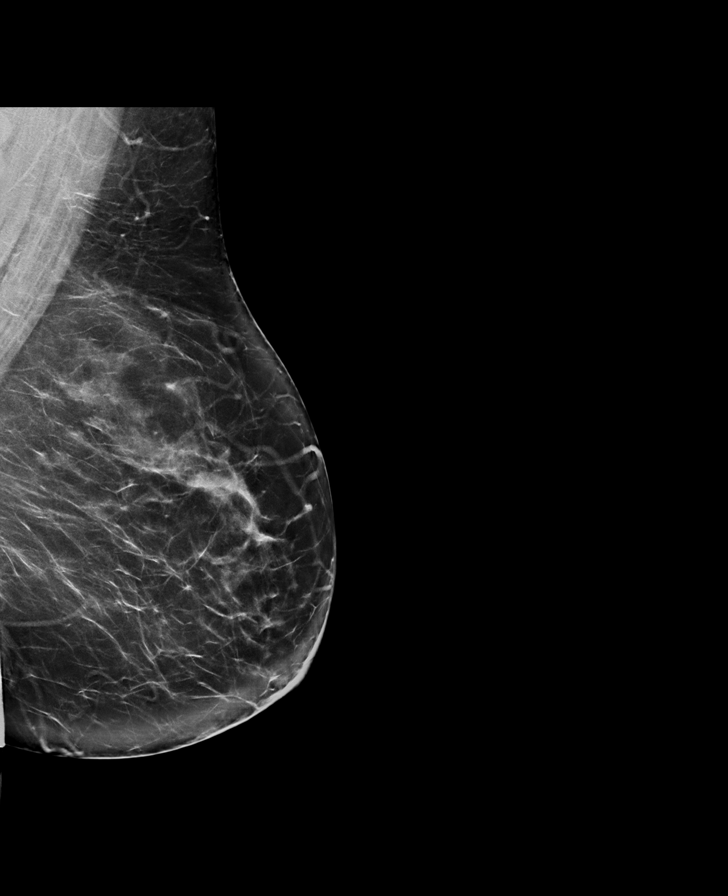

[R MLO (2 of 2)]
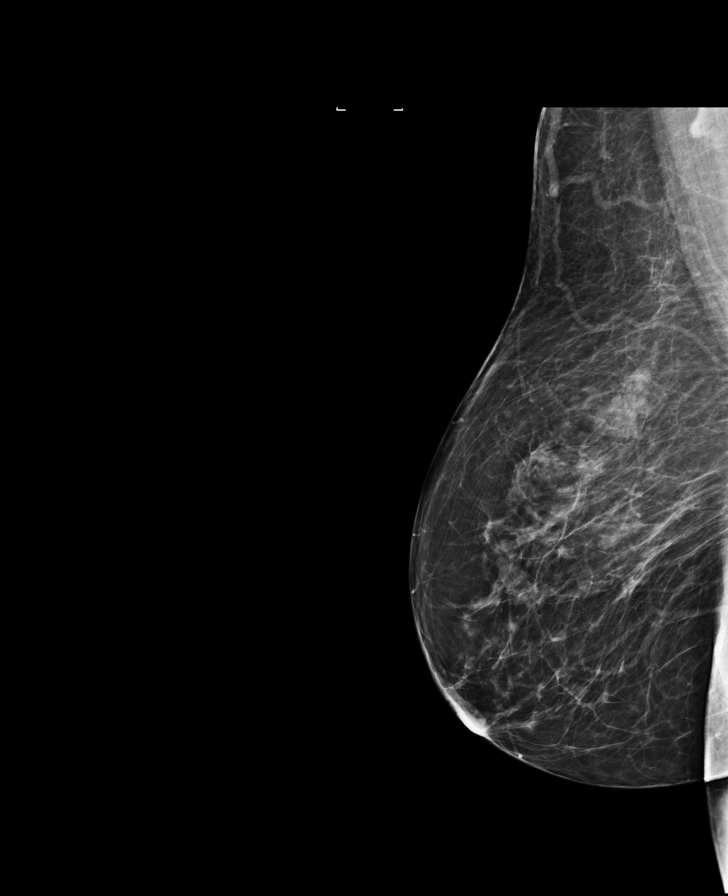

[R MLO synth-2D]
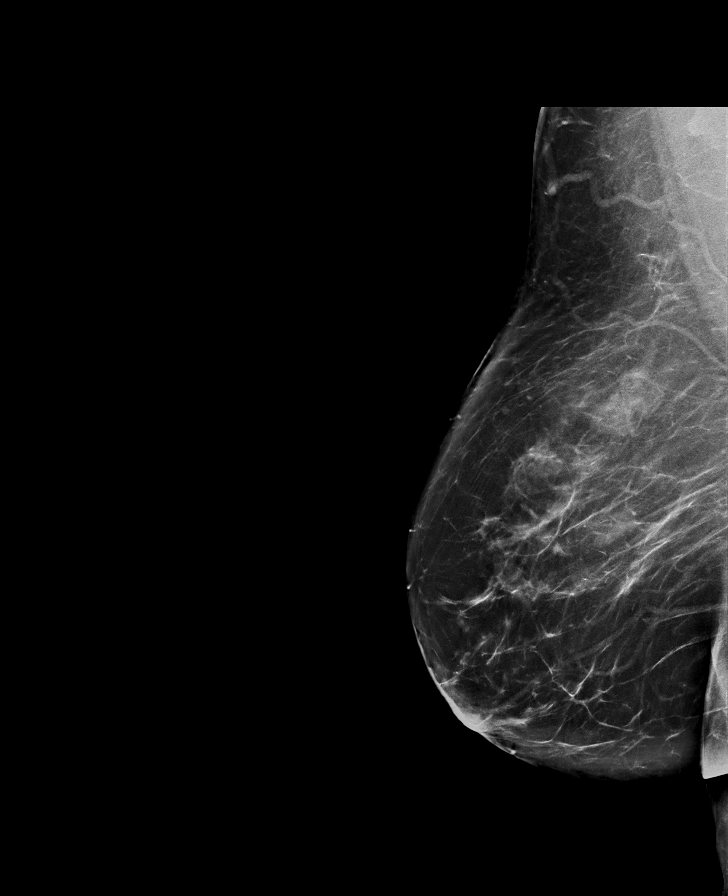

[L CC synth-2D]
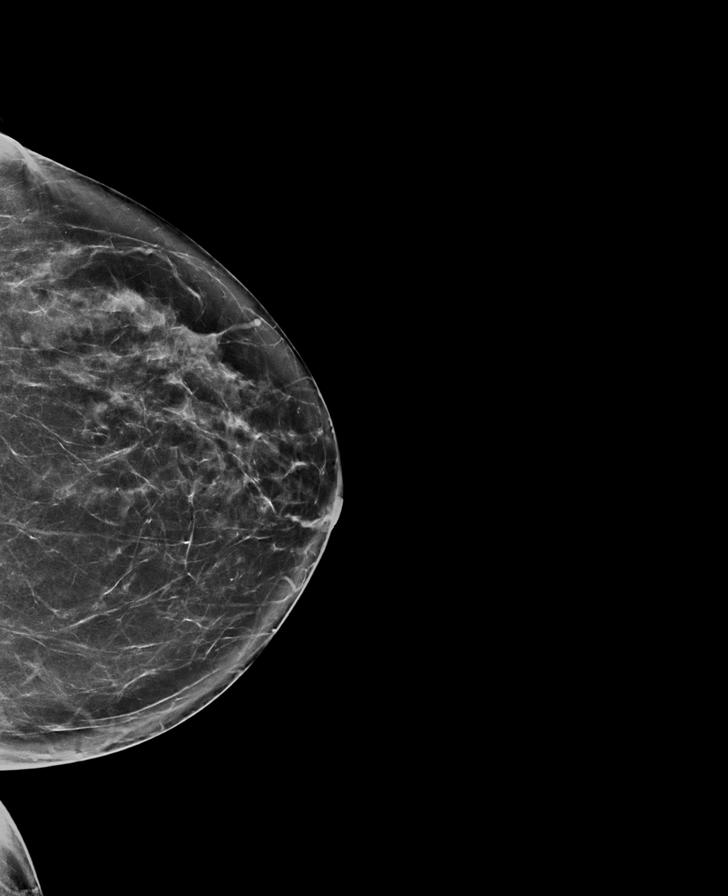

[L CC]
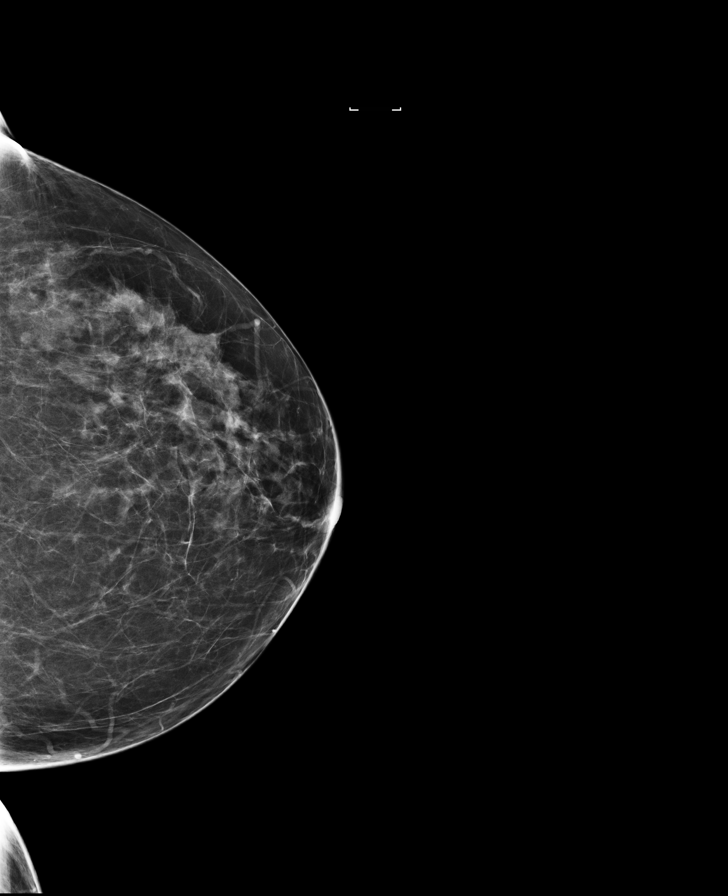

[R CC synth-2D]
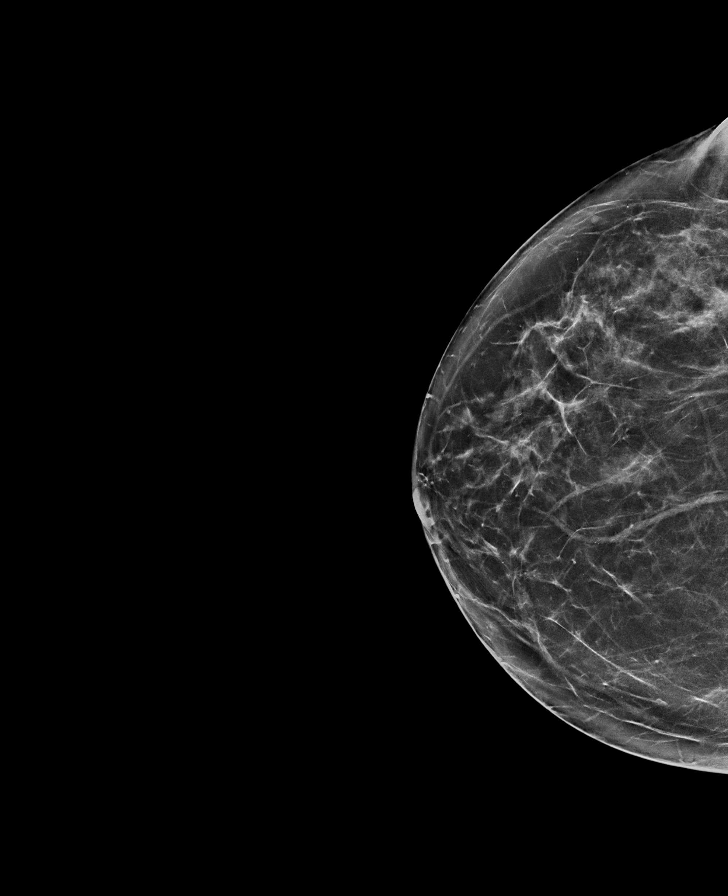

[8 of 29 positions shown; findings below may reference images not displayed]

ACR Breast Density Category b: There are scattered areas of
fibroglandular density.
FINDINGS: There are no findings suspicious for malignancy. Images were
processed with CAD.
IMPRESSION: No mammographic evidence of malignancy. A result letter of this
screening mammogram will be mailed directly to the patient.

RECOMMENDATION:
Screening mammogram in one year. (Code:97-6-RS4)

BI-RADS CATEGORY  1: Negative.

## 2018-11-04 IMAGING — CR DG LUMBAR SPINE COMPLETE 4+V
1 series · 5 of 5 positions shown · non-contrast
Comparison: Chest x-ray dated 04/12/2014

CLINICAL DATA: Low back pain.

EXAM:
LUMBAR SPINE - COMPLETE 4+ VIEW

[Series 1: dg lumbar spine complete 4 +v · 0.14mm/px · 5 of 5 slices shown]
[im 1/5]
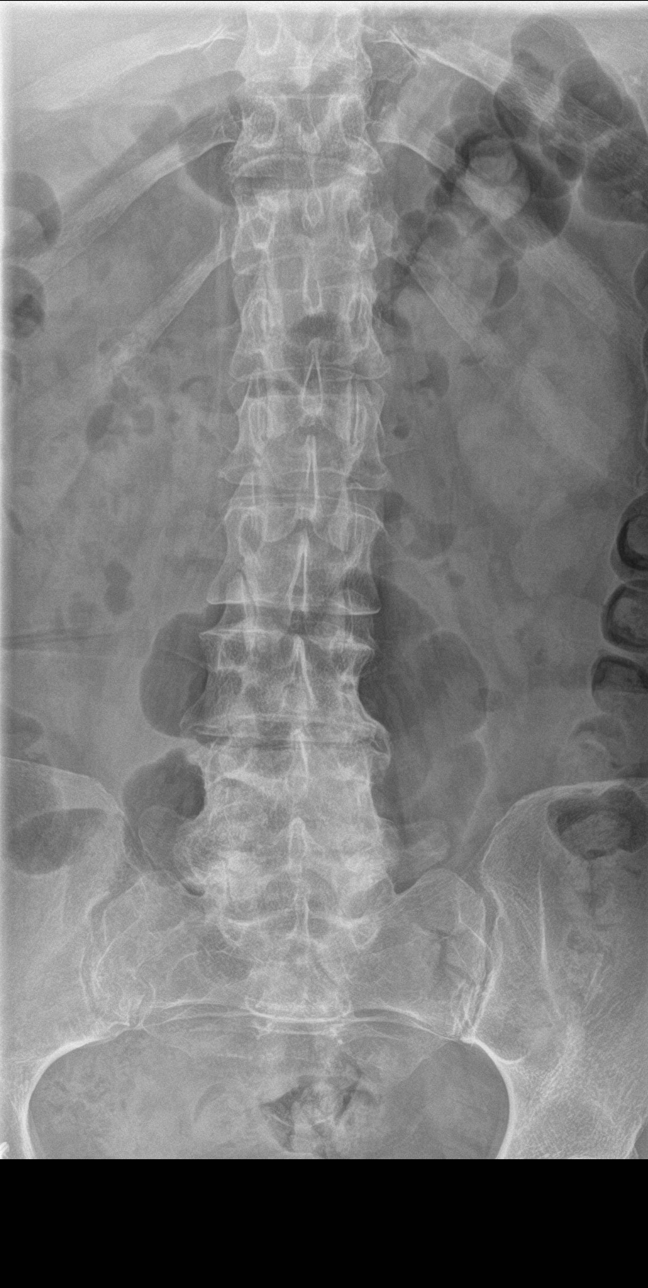
[im 2/5]
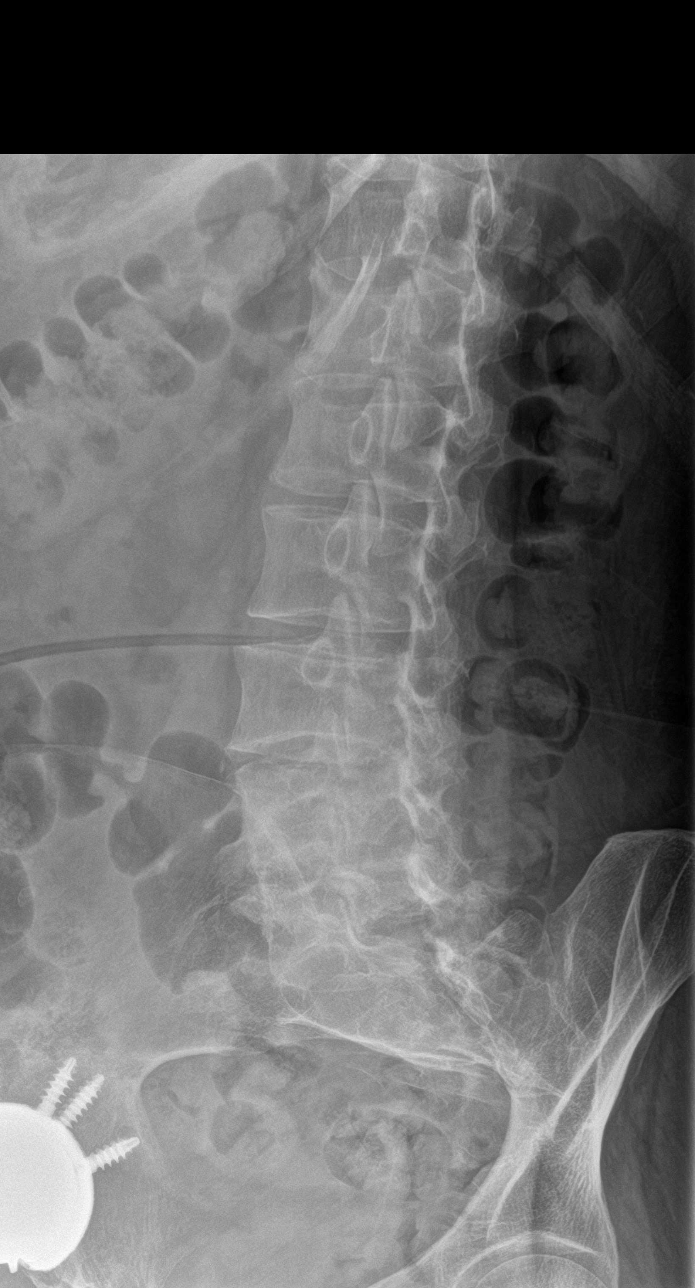
[im 3/5]
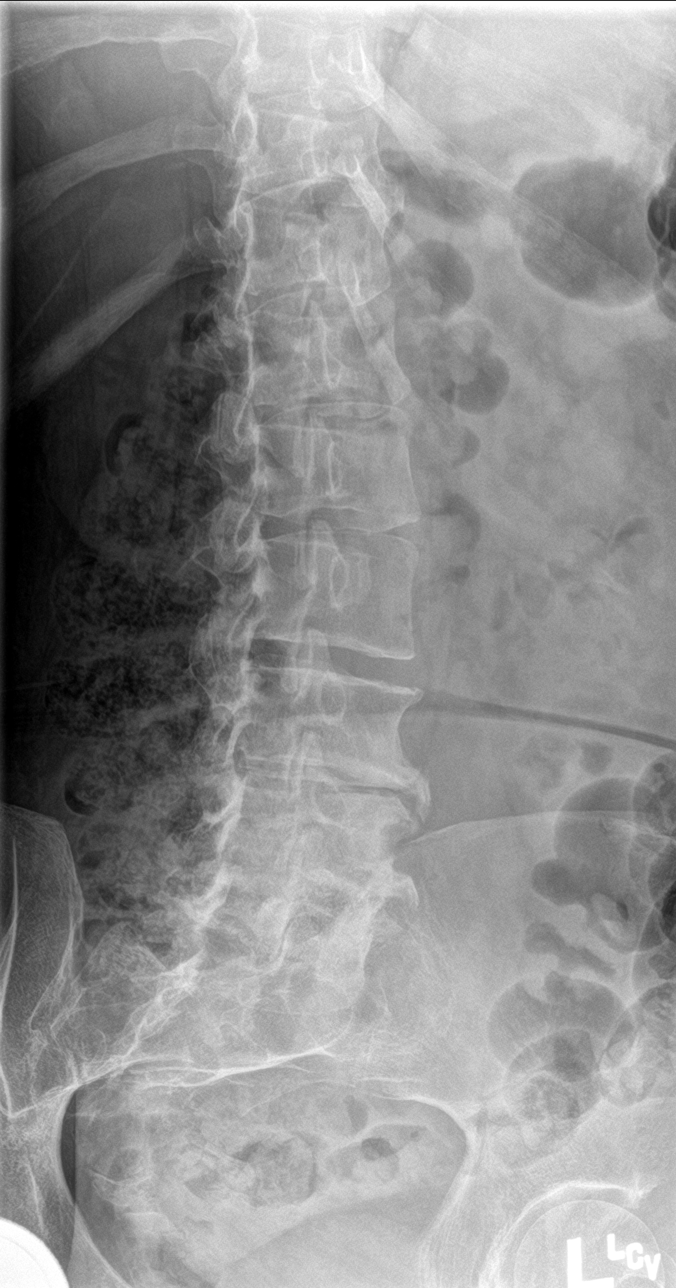
[im 4/5]
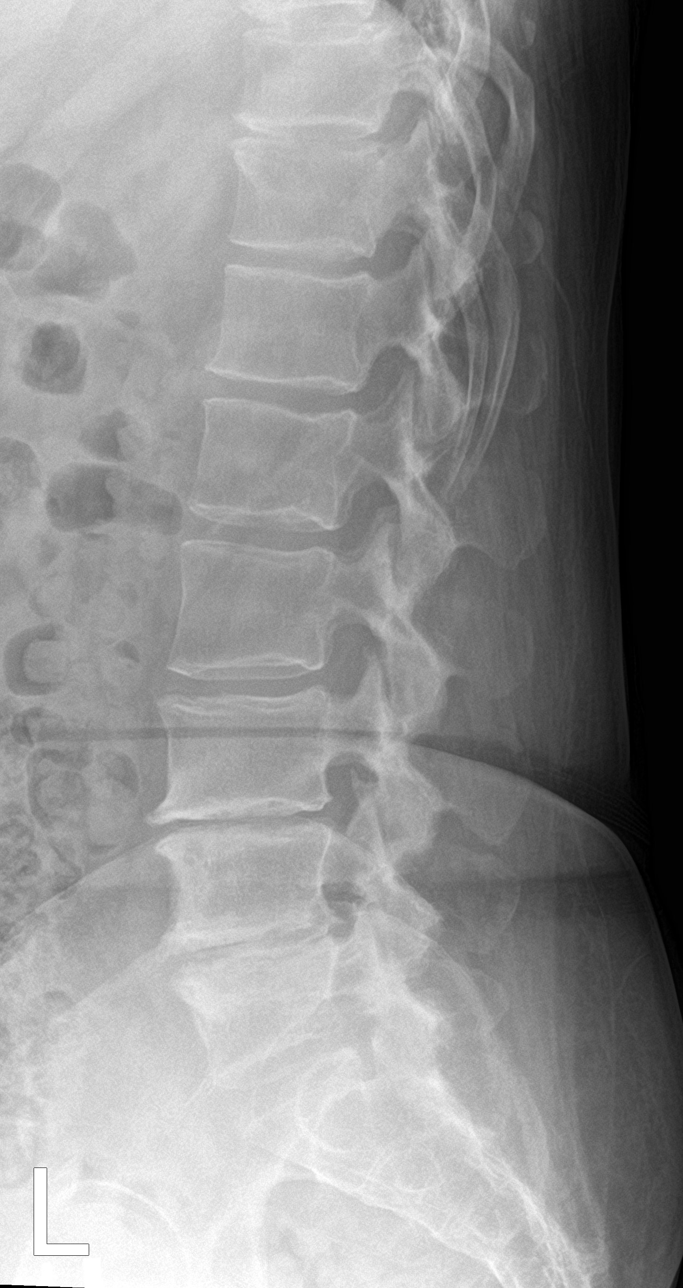
[im 5/5]
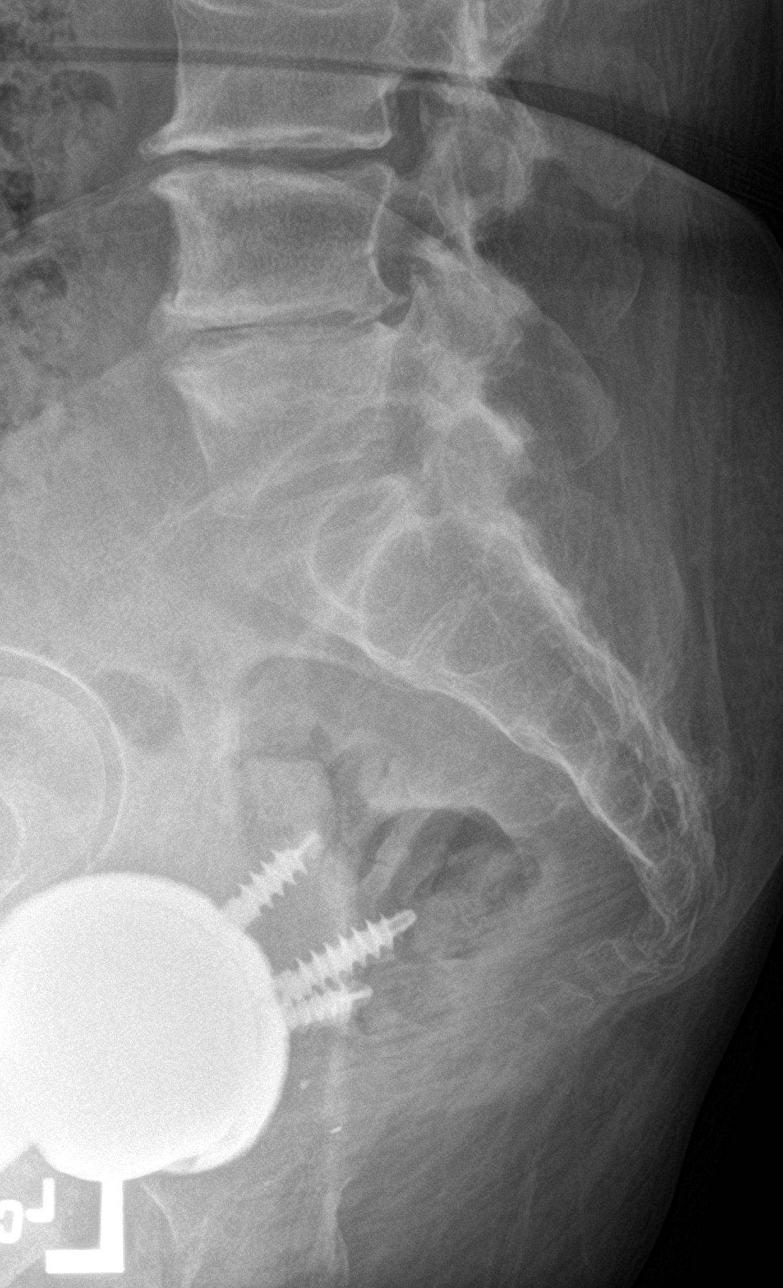

[5 of 5 positions shown; findings below may reference images not displayed]

FINDINGS: There is lumbarization of the S1 segment. There is moderately severe
degenerative disc disease at L4-5 and L5-S1 with disc space
narrowing and sclerosis of the vertebral endplates. Alignment is
anatomic. Moderately severe facet arthritis at L4-5 and L5-S1
bilaterally.

No fractures or bone destruction.
IMPRESSION: Degenerative disc and joint disease at L4-5 and L5-S1. Congenital
lumbarization of the S1 segment.

## 2018-11-29 NOTE — Progress Notes (Signed)
[place]  Telephone:(336) [phone] Fax:(336) [phone]  ID: Kendra Herring OB: 04-Jun-1961  MR#: [id number]  YBO#:[id number]  Patient Care Team: Letta Median, MD as PCP - General (Family Medicine) Clent Jacks, RN as Registered Nurse Theora Gianotti, Venida Jarvis, MD as Referring Physician (Obstetrics and Gynecology) Mellody Drown, MD as Referring Physician (Obstetrics and Gynecology) Noreene Filbert, MD as Referring Physician (Radiation Oncology) Lloyd Huger, MD as Consulting Physician (Oncology) Christel Mormon Marjory Sneddon, MD as Referring Physician (Radiation Oncology)  CHIEF COMPLAINT: 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   INTERVAL HISTORY: Patient returns to clinic today for routine 58-month evaluation.  She continues to have chronic weakness and fatigue, but otherwise feels well.  She denies any pain today. She continues to be highly anxious. She has no neurologic complaints. She denies any recent fevers or illnesses. She denies any chest pain or shortness of breath. She has no nausea, vomiting, constipation, or diarrhea. She denies any melena or hematochezia. She has no urinary complaints.  Patient offers no further specific complaints today.  REVIEW OF SYSTEMS:   Review of Systems  Constitutional: Positive for malaise/fatigue. Negative for fever and weight loss.  Eyes: Negative.   Respiratory: Negative.  Negative for cough and shortness of breath.   Cardiovascular: Negative.  Negative for chest pain and leg swelling.  Gastrointestinal: Negative.  Negative for abdominal pain, blood in stool, melena, nausea and vomiting.  Genitourinary: Negative.  Negative for hematuria.  Musculoskeletal: Negative.   Skin: Negative.  Negative for rash.  Neurological: Positive for weakness. Negative for sensory change, focal weakness and headaches.  Psychiatric/Behavioral: The patient is nervous/anxious. The patient does not have insomnia.     As per HPI.  Otherwise, a complete review of systems is negative.  PAST MEDICAL HISTORY: Past Medical History:  Diagnosis Date  . Anxiety   . Arrhythmia   . Arthritis   . Asthma   . Cervical cancer ([place]) 05/2017   Rad, chemo and brachytherapy tx's.   Marland Kitchen Hernia of abdominal wall 04/17/2017  . Personal history of chemotherapy   . Personal history of radiation therapy     PAST SURGICAL HISTORY: Past Surgical History:  Procedure Laterality Date  . HIP SURGERY    . TUBAL LIGATION      FAMILY HISTORY: Family History  Problem Relation Age of Onset  . Diabetes Mother   . Breast cancer Neg Hx     ADVANCED DIRECTIVES (Y/N):  N  HEALTH MAINTENANCE: Social History   Tobacco Use  . Smoking status: Never Smoker  . Smokeless tobacco: Never Used  Substance Use Topics  . Alcohol use: No  . Drug use: No     Colonoscopy:  PAP:  Bone density:  Lipid panel:  Allergies  Allergen Reactions  . Pseudoephedrine Palpitations    Current Outpatient Medications  Medication Sig Dispense Refill  . Albuterol Sulfate 108 (90 Base) MCG/ACT AEPB Inhale into the lungs.    . Cholecalciferol (VITAMIN D-3 PO) Take by mouth once a week.    . meloxicam (MOBIC) 15 MG tablet Take 15 mg by mouth daily.    . sertraline (ZOLOFT) 25 MG tablet Take 25 mg by mouth daily.     No current facility-administered medications for this visit.     OBJECTIVE: Vitals:   12/03/18 1354  BP: (!) 141/77  Pulse: 72  Resp: 18  Temp: (!) 97.3 F (36.3 C)     Body mass index is 31.69 kg/m.  ECOG FS:0 - Asymptomatic  General: Well-developed, well-nourished, no acute distress. Eyes: Pink conjunctiva, anicteric sclera. HEENT: Normocephalic, moist mucous membranes. Lungs: Clear to auscultation bilaterally. Heart: Regular rate and rhythm. No rubs, murmurs, or gallops. Abdomen: Soft, nontender, nondistended. No organomegaly noted, normoactive bowel sounds. Musculoskeletal: No edema, cyanosis, or clubbing. Neuro: Alert,  answering all questions appropriately. Cranial nerves grossly intact. Skin: No rashes or petechiae noted. Psych: Normal affect.  LAB RESULTS:  Lab Results  Component Value Date   NA 139 05/30/2018   K 3.6 05/30/2018   CL 104 05/30/2018   CO2 24 05/30/2018   GLUCOSE 151 (H) 05/30/2018   BUN 16 05/30/2018   CREATININE 0.71 05/30/2018   CALCIUM 9.7 05/30/2018   PROT 7.4 06/09/2017   ALBUMIN 4.1 06/09/2017   AST 28 06/09/2017   ALT 26 06/09/2017   ALKPHOS 69 06/09/2017   BILITOT 0.7 06/09/2017   GFRNONAA >60 05/30/2018   GFRAA >60 05/30/2018    Lab Results  Component Value Date   WBC 5.8 05/30/2018   NEUTROABS 4.2 05/30/2018   HGB 14.3 05/30/2018   HCT 41.7 05/30/2018   MCV 91.2 05/30/2018   PLT 246 05/30/2018     STUDIES: No results found.  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  PLAN:    1. 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 Patient completed her concurrent chemotherapy with cisplatin and XRT on June 06, 2017. She completed brachytherapy at [facility] on July 24, 2017.  PET scan results from [facility] on November 07, 2017 reviewed independently with no residual or progressive disease.her most recent imaging with CT scan on June 06, 2018 did not reveal evidence of recurrence.  Return to clinic in 6 months with repeat imaging and further evaluation.  Patient states she is no longer followed by gynecology oncology, but have recommended follow-up for further evaluation.   2.  Anxiety: Continue monitoring and treatment per primary care. 3.  Pain: Patient does not complain of this today.  I spent a total of 20 minutes face-to-face with the patient of which greater than 50% of the visit was spent in counseling and coordination of care as detailed above.   Patient expressed understanding and was in agreement with this  plan. She also understands that She can call clinic at any time with any questions, concerns, or complaints.   Cancer Staging Cervical carcinoma [facility]) Staging form: Cervix Uteri, AJCC 8th Edition - Clinical stage from 04/27/2017: FIGO Stage IIA (cT2a, cN0, cM0) - Signed by Lloyd Huger, MD on 04/27/2017   Lloyd Huger, MD   12/05/2018 5:23 PM

## 2018-12-03 ENCOUNTER — Encounter: Payer: Self-pay | Admitting: Oncology

## 2018-12-03 ENCOUNTER — Ambulatory Visit: Payer: Medicare Other | Admitting: Oncology

## 2018-12-03 ENCOUNTER — Inpatient Hospital Stay: Payer: Medicare Other | Attending: Oncology | Admitting: Oncology

## 2018-12-03 ENCOUNTER — Other Ambulatory Visit: Payer: Self-pay

## 2018-12-03 VITALS — BP 141/77 | HR 72 | Temp 97.3°F | Resp 18 | Wt 156.9 lb

## 2018-12-03 DIAGNOSIS — Z923 Personal history of irradiation: Secondary | ICD-10-CM | POA: Diagnosis not present

## 2018-12-03 DIAGNOSIS — Z9221 Personal history of antineoplastic chemotherapy: Secondary | ICD-10-CM | POA: Diagnosis not present

## 2018-12-03 DIAGNOSIS — Z791 Long term (current) use of non-steroidal anti-inflammatories (NSAID): Secondary | ICD-10-CM | POA: Diagnosis not present

## 2018-12-03 DIAGNOSIS — Z79899 Other long term (current) drug therapy: Secondary | ICD-10-CM | POA: Diagnosis not present

## 2018-12-03 DIAGNOSIS — F419 Anxiety disorder, unspecified: Secondary | ICD-10-CM | POA: Diagnosis not present

## 2018-12-03 DIAGNOSIS — R52 Pain, unspecified: Secondary | ICD-10-CM

## 2018-12-03 DIAGNOSIS — C539 Malignant neoplasm of cervix uteri, unspecified: Secondary | ICD-10-CM | POA: Insufficient documentation

## 2018-12-03 NOTE — Progress Notes (Signed)
Patient here today for follow up. Patient reports multiple falls due to knee giving out on right side.

## 2019-04-14 ENCOUNTER — Ambulatory Visit: Payer: Self-pay | Admitting: *Deleted

## 2019-04-14 NOTE — Telephone Encounter (Signed)
Pt calling stating that blood glucose was 530 before calling the office. Pt states that she did have something to eat before checking her blood glucose because she was taking her diabetic medicine Glipizide. Pt states that she usually takes Glipizide once daily at 6 pm.Pt states that her PCP is Dr. Rebeca Alert but she did not contact that office because it was closed. Pt rechecked blood glucose while on the phone with triage nurse and it was noted to be 225. Pt advised to keep a record of blood glucose readings and to keep her PCP updated if numbers continue to be increased. Pt also advised to contact PCP for recommendations on when to take Glipizide for best glucose control during the day.Pt advised to seek treatment in the ED if glucose is above 500 again during the night. Pt verbalized understanding.   Reason for Disposition . Blood glucose 70-240 mg/dL (3.9 -13.3 mmol/L)  Answer Assessment - Initial Assessment Questions 1. BLOOD GLUCOSE: "What is your blood glucose level?"      530 2. ONSET: "When did you check the blood glucose?"     A few minutes before calling 3. USUAL RANGE: "What is your glucose level usually?" (e.g., usual fasting morning value, usual evening value)     Last night [id number]-380 and today around 11 am was 120 4. KETONES: "Do you check for ketones (urine or blood test strips)?" If yes, ask: "What does the test show now?"      n/a 5. TYPE 1 or 2:  "Do you know what type of diabetes you have?"  (e.g., Type 1, Type 2, Gestational; doesn't know)      Type 2 6. INSULIN: "Do you take insulin?" "What type of insulin(s) do you use? What is the mode of delivery? (syringe, pen (e.g., injection or  pump)?"      no 7. DIABETES PILLS: "Do you take any pills for your diabetes?" If yes, ask: "Have you missed taking any pills recently?"     One tab a day Glipizide-10mg  8. OTHER SYMPTOMS: "Do you have any symptoms?" (e.g., fever, frequent urination, difficulty breathing, dizziness, weakness,  vomiting)     headache 9. PREGNANCY: "Is there any chance you are pregnant?" "When was your last menstrual period?"     no  Protocols used: DIABETES - HIGH BLOOD SUGAR-A-AH

## 2019-05-30 ENCOUNTER — Ambulatory Visit: Payer: Medicare Other

## 2019-05-31 NOTE — Progress Notes (Signed)
Kendra Herring  [phone] [phone]  ID: Eppie Gibson OB: 09-01-1961  MR#: [id number]  OBS#:[id number]  Patient Care Team: Letta Median, MD as PCP - General (Family Medicine) Clent Jacks, RN as Registered Nurse Theora Gianotti, Venida Jarvis, MD as Referring Physician (Obstetrics and Gynecology) Mellody Drown, MD as Referring Physician (Obstetrics and Gynecology) Noreene Filbert, MD as Referring Physician (Radiation Oncology) Lloyd Huger, MD as Consulting Physician (Oncology) Christel Mormon Marjory Sneddon, MD as Referring Physician (Radiation Oncology)  CHIEF COMPLAINT: 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   INTERVAL HISTORY: Patient returns to clinic today for routine 68-month evaluation and discussion of her imaging results.  She continues to have chronic weakness and fatigue, but otherwise feels well and is asymptomatic. She denies any pain today.  She has no neurologic complaints. She denies any recent fevers or illnesses.  She denies any chest pain, shortness of breath, cough, or hemoptysis.  She has no nausea, vomiting, constipation, or diarrhea. She denies any melena or hematochezia. She has no urinary complaints.  Patient offers no further specific complaints today.  REVIEW OF SYSTEMS:   Review of Systems  Constitutional: Positive for malaise/fatigue. Negative for fever and weight loss.  Eyes: Negative.   Respiratory: Negative.  Negative for cough and shortness of breath.   Cardiovascular: Negative.  Negative for chest pain and leg swelling.  Gastrointestinal: Negative.  Negative for abdominal pain, blood in stool, melena, nausea and vomiting.  Genitourinary: Negative.  Negative for hematuria.  Musculoskeletal: Negative.   Skin: Negative.  Negative for rash.  Neurological: Positive for weakness. Negative for sensory change, focal weakness and headaches.  Psychiatric/Behavioral: The patient is nervous/anxious. The patient  does not have insomnia.     As per HPI. Otherwise, a complete review of systems is negative.  PAST MEDICAL HISTORY: Past Medical History:  Diagnosis Date  . Anxiety   . Arrhythmia   . Arthritis   . Asthma   . Cervical cancer ([place]) 03/2017   Rad, chemo and brachytherapy tx's.   . Diabetes mellitus without complication ([place])   . Hernia of abdominal wall 04/17/2017  . Personal history of chemotherapy   . Personal history of radiation therapy     PAST SURGICAL HISTORY: Past Surgical History:  Procedure Laterality Date  . HIP SURGERY    . TUBAL LIGATION      FAMILY HISTORY: Family History  Problem Relation Age of Onset  . Diabetes Mother   . Breast cancer Neg Hx     ADVANCED DIRECTIVES (Y/N):  N  HEALTH MAINTENANCE: Social History   Tobacco Use  . Smoking status: Never Smoker  . Smokeless tobacco: Never Used  Substance Use Topics  . Alcohol use: No  . Drug use: No     Colonoscopy:  PAP:  Bone density:  Lipid panel:  Allergies  Allergen Reactions  . Pseudoephedrine Palpitations    Current Outpatient Medications  Medication Sig Dispense Refill  . Accu-Chek FastClix Lancets MISC USE TID UTD    . ACCU-CHEK GUIDE test strip USE TID UTD    . Albuterol Sulfate 108 (90 Base) MCG/ACT AEPB Inhale into the lungs.    . Cholecalciferol (VITAMIN D-3 PO) Take by mouth once a week.    Marland Kitchen glipiZIDE (GLUCOTROL XL) 10 MG 24 hr tablet TK 1 T PO D FOR DIABETES    . meloxicam (MOBIC) 15 MG tablet Take 15 mg by mouth daily.    . sertraline (ZOLOFT) 25 MG tablet Take  25 mg by mouth daily.    . traZODone (DESYREL) 50 MG tablet TK 1 T PO HS PRF SLP     No current facility-administered medications for this visit.     OBJECTIVE: There were no vitals filed for this visit.   There is no height or weight on file to calculate BMI.    ECOG FS:0 - Asymptomatic  General: Well-developed, well-nourished, no acute distress. Eyes: Pink conjunctiva, anicteric sclera. HEENT:  Normocephalic, moist mucous membranes. Lungs: Clear to auscultation bilaterally. Heart: Regular rate and rhythm. No rubs, murmurs, or gallops. Abdomen: Soft, nontender, nondistended. No organomegaly noted, normoactive bowel sounds. Musculoskeletal: No edema, cyanosis, or clubbing. GYN: Exam performed at today by another provider reported as normal. Neuro: Alert, answering all questions appropriately. Cranial nerves grossly intact. Skin: No rashes or petechiae noted. Psych: Normal affect.  LAB RESULTS:  Lab Results  Component Value Date   NA 139 05/30/2018   K 3.6 05/30/2018   CL 104 05/30/2018   CO2 24 05/30/2018   GLUCOSE 151 (H) 05/30/2018   BUN 16 05/30/2018   CREATININE 0.71 05/30/2018   CALCIUM 9.7 05/30/2018   PROT 7.4 06/09/2017   ALBUMIN 4.1 06/09/2017   AST 28 06/09/2017   ALT 26 06/09/2017   ALKPHOS 69 06/09/2017   BILITOT 0.7 06/09/2017   GFRNONAA >60 05/30/2018   GFRAA >60 05/30/2018    Lab Results  Component Value Date   WBC 5.8 05/30/2018   NEUTROABS 4.2 05/30/2018   HGB 14.3 05/30/2018   HCT 41.7 05/30/2018   MCV 91.2 05/30/2018   PLT 246 05/30/2018     STUDIES: Ct Abdomen Pelvis W Contrast  Result Date: 06/03/2019 CLINICAL DATA:  Restaging cervical cancer. EXAM: CT ABDOMEN AND PELVIS WITH CONTRAST TECHNIQUE: Multidetector CT imaging of the abdomen and pelvis was performed using the standard protocol following bolus administration of intravenous contrast. CONTRAST:  163mL OMNIPAQUE IOHEXOL 300 MG/ML  SOLN COMPARISON:  CT 06/06/2018 FINDINGS: Lower chest: Lung bases are clear. Hepatobiliary: Small enhancing lesion in the superior aspect of the RIGHT hepatic lobe (image 11/2) measures 6 mm. This lesion is not changed from comparison exam and likely represent benign vascular phenomena or small flash filling hemangioma Pancreas: Pancreas is normal. No ductal dilatation. No pancreatic inflammation. Spleen: Normal spleen Adrenals/urinary tract: Adrenal glands  and kidneys are normal. The ureters and bladder normal. Stomach/Bowel: Stomach, small bowel, appendix, and cecum are normal. The colon and rectosigmoid colon are normal. Vascular/Lymphatic: Abdominal aorta is normal caliber. No periportal or retroperitoneal adenopathy. No pelvic adenopathy. Reproductive: Uterus and ovaries normal.  No pelvic nodularity. Small external iliac lymph nodes measures 6 mm (image 67/2) is not changed from prior. Other: No peritoneal disease. Musculoskeletal: No aggressive osseous lesion. RIGHT hip prosthesis. IMPRESSION: 1. No evidence of metastatic cervical carcinoma. 2. Stable small enhancing lesion RIGHT hepatic lobe is favored benign. 3. Small external iliac lymph nodes are stable and not pathologic by size criteria. Electronically Signed   By: Suzy Bouchard M.D.   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  PLAN:    1. 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 Patient completed her concurrent chemotherapy with cisplatin and XRT on June 06, 2017. She completed brachytherapy at [facility] on July 24, 2017.  PET scan results from [facility] on November 07, 2017 reviewed independently with no  residual or progressive disease.  Most recent imaging with CT scan on June 03, 2019 reviewed independently and reported as above with no obvious evidence of recurrent or progressive disease.  No further imaging is necessary unless there is suspicion of recurrence.  Return to clinic in 6 months for routine evaluation.    2.  Anxiety: Continue monitoring and treatment per primary care. 3.  Pain: Patient does not complain of this today.  I spent a total of 20 minutes face-to-face with the patient of which greater than 50% of the visit was spent in counseling and coordination of care as detailed above.   Patient expressed  understanding and was in agreement with this plan. She also understands that She can call clinic at any time with any questions, concerns, or complaints.   Cancer Staging Cervical carcinoma [facility]) Staging form: Cervix Uteri, AJCC 8th Edition - Clinical stage from 04/27/2017: FIGO Stage IIA (cT2a, cN0, cM0) - Signed by Lloyd Huger, MD on 04/27/2017   Lloyd Huger, MD   06/05/2019 6:25 AM

## 2019-06-03 ENCOUNTER — Other Ambulatory Visit: Payer: Self-pay

## 2019-06-03 ENCOUNTER — Ambulatory Visit
Admission: RE | Admit: 2019-06-03 | Discharge: 2019-06-03 | Disposition: A | Discharge status: Home / Self Care | Payer: Medicare Other | Source: Ambulatory Visit | Attending: Oncology | Admitting: Oncology

## 2019-06-03 DIAGNOSIS — C539 Malignant neoplasm of cervix uteri, unspecified: Secondary | ICD-10-CM | POA: Diagnosis not present

## 2019-06-03 HISTORY — DX: Type 2 diabetes mellitus without complications: E11.9

## 2019-06-03 MED ORDER — IOHEXOL 300 MG/ML  SOLN
100.0000 mL | Freq: Once | INTRAMUSCULAR | Status: AC | PRN
  Administered 2019-06-03: 100 mL via INTRAVENOUS

## 2019-06-04 ENCOUNTER — Inpatient Hospital Stay: Payer: Medicare Other | Attending: Oncology | Admitting: Oncology

## 2019-06-04 ENCOUNTER — Inpatient Hospital Stay (HOSPITAL_BASED_OUTPATIENT_CLINIC_OR_DEPARTMENT_OTHER): Payer: Medicare Other | Admitting: Obstetrics and Gynecology

## 2019-06-04 ENCOUNTER — Other Ambulatory Visit: Payer: Self-pay

## 2019-06-04 VITALS — BP 148/68 | HR 82 | Temp 97.5°F | Resp 20 | Ht 59.0 in | Wt 161.1 lb

## 2019-06-04 DIAGNOSIS — Z9221 Personal history of antineoplastic chemotherapy: Secondary | ICD-10-CM | POA: Diagnosis not present

## 2019-06-04 DIAGNOSIS — C539 Malignant neoplasm of cervix uteri, unspecified: Secondary | ICD-10-CM

## 2019-06-04 DIAGNOSIS — Z79899 Other long term (current) drug therapy: Secondary | ICD-10-CM | POA: Diagnosis not present

## 2019-06-04 DIAGNOSIS — E119 Type 2 diabetes mellitus without complications: Secondary | ICD-10-CM | POA: Insufficient documentation

## 2019-06-04 DIAGNOSIS — Z780 Asymptomatic menopausal state: Secondary | ICD-10-CM | POA: Diagnosis not present

## 2019-06-04 DIAGNOSIS — J45909 Unspecified asthma, uncomplicated: Secondary | ICD-10-CM | POA: Insufficient documentation

## 2019-06-04 DIAGNOSIS — Z96649 Presence of unspecified artificial hip joint: Secondary | ICD-10-CM | POA: Diagnosis not present

## 2019-06-04 DIAGNOSIS — Z923 Personal history of irradiation: Secondary | ICD-10-CM | POA: Diagnosis not present

## 2019-06-04 DIAGNOSIS — M1990 Unspecified osteoarthritis, unspecified site: Secondary | ICD-10-CM | POA: Diagnosis not present

## 2019-06-04 DIAGNOSIS — Z791 Long term (current) use of non-steroidal anti-inflammatories (NSAID): Secondary | ICD-10-CM | POA: Insufficient documentation

## 2019-06-04 DIAGNOSIS — F419 Anxiety disorder, unspecified: Secondary | ICD-10-CM | POA: Insufficient documentation

## 2019-06-04 DIAGNOSIS — I499 Cardiac arrhythmia, unspecified: Secondary | ICD-10-CM | POA: Insufficient documentation

## 2019-06-04 NOTE — Progress Notes (Signed)
Gynecologic Oncology Interval Visit   Referring Provider: Gae Dry, MD  Chief Concern: cervical cancer  Subjective:  Kendra Herring is a 58 y.o. female, initially seen in consultation from Dr. Kenton Kingfisher for new diagnosis of invasive cervical cancer, returns to clinic for surveillance.   Patient returns today for surveillance.  No new complaints.   CT scan yesterday.   IMPRESSION: 1. No evidence of metastatic cervical carcinoma. 2. Stable small enhancing lesion RIGHT hepatic lobe is favored benign. 3. Small external iliac lymph nodes are stable and not pathologic by size criteria.  Oncology history Kendra Herring is a pleasant postmenopausal [phone] who presented to Dr. Kenton Kingfisher evaluation and management of abnormal cervical cytology, AGUS, favor high risk cells.  Pt also had abnormal exam appearance of cervix on exam.    Colposcopy performed with 4% acetic acid as well as EMBx. Exam notable for the following: Aceto-white Lesions Location(s): throughout, abnormal appearance, contact bleeding               Part A: CERVIX, 6:00, BIOPSY:  INVASIVE SQUAMOUS CELL CARCINOMA.  Part B: CERVIX, 10:00, BIOPSY:  HIGH-GRADE SQUAMOUS INTRAEPITHELIAL LESION (CIN 3).  CHRONIC CERVICITIS WITH SQUAMOUS METAPLASIA.  Part C: CERVIX, 12:00, BIOPSY:  FOCAL HIGH-GRADE SQUAMOUS INTRAEPITHELIAL LESION (CIN 3).  CHRONIC CERVICITIS.  Part D: ENDOCERVIX, CURETTINGS:  INVASIVE SQUAMOUS CELL CARCINOMA.  FRAGMENTED BIOPSY SPECIMEN.  COMMENT: THIS CASE WAS REVIEWED IN INTRADEPARTMENTAL CONSULTATION AND THE  DIAGNOSIS REFLECTS OUR CONSENSUS OPINION. THE RESULTS WERE CALLED TO  DR.HARRIS'S OFFICE BY DR.QAYUMI AND GIVEN TO MS.KIM ON 04/02/2017.I  MQA/04/02/2017   ENDOMETRIUM, BIOPSY:  SCANT FRAGMENTS OF INACTIVE ENDOMETRIUM, MIXED WITH MINUTE FRAGMENTS OF  BENIGN ENDOCERVICAL GLANDS, MUCUS, AND BLOOD. NO HYPERPLASIA OR  CARCINOMA.   She was seen in the ER 03/28/2017 for back pain. Workup included   CT  scan - dedicated renal protocol: 1. Negative for nephrolithiasis, hydronephrosis or ureteral stone. 2. There are no acute abnormalities visualized.   Dr Theora Gianotti examined patient and felt it was difficult to feel the tumor and get a sense of its size because it is inside the canal.  And she was concerned about her back pain possibly being due to metastatic disease.   PET/CT 04/16/17 IMPRESSION:  I reviewed images with the radiologist on the phone. 1. Small focus of abnormal accentuated activity along the posterior cervix, potentially a site of focal neoplastic disease/malignancy. No pathologic adenopathy or evidence of metastatic spread. 2. Right total hip prosthesis noted with cerclage wires along the stem component. 3. Lumbar spondylosis and degenerative disc disease with potential impingement at L3-4 and L4-5.   MRI IMPRESSION: Image degradation by artifact from right hip prosthesis noted.  Subtle ill-defined 1.9 cm mass in the posterior wall of the cervix, consistent with known cervical carcinoma.  No evidence of extra-cervical tumor extension or pelvic lymphadenopathy.   She was [facility] at [facility] for consultation and stage was felt to be IIA due to extension of the tumor into posterior vagina.  Decision was made to treat her with chemoradiation and see received weekly cisplatin and external radiation followed by brachytherapy.  Problem List: Patient Active Problem List   Diagnosis Date Noted  . Anxiety disorder, unspecified 04/30/2017  . Squamous cell carcinoma of cervix ([place]) 04/30/2017  . Cervical carcinoma ([place]) 04/06/2017  . Hip pain 05/02/2016  . History of total hip arthroplasty 05/02/2016    Past Medical History: Past Medical History:  Diagnosis Date  . Anxiety   . Arrhythmia   .  Arthritis   . Asthma   . Cervical cancer ([place]) 03/2017   Rad, chemo and brachytherapy tx's.   . Diabetes mellitus without complication ([place])   . Hernia of abdominal wall 04/17/2017  .  Personal history of chemotherapy   . Personal history of radiation therapy     Past Surgical History: Past Surgical History:  Procedure Laterality Date  . HIP SURGERY    . TUBAL LIGATION      Past Gynecologic History:  Menarche: 15 Menstrual details: postmenopausal Menses regular: no Last Menstrual Period: a year ago History of Abnormal pap: yes, glandular cell abnormality (AGUS) Last pap: see HPI Contraception: bilateral tubal ligation   OB History:  OB History  Gravida Para Term Preterm AB Living  3 3 3     3   SAB TAB Ectopic Multiple Live Births               # Outcome Date GA Lbr Len/2nd Weight Sex Delivery Anes PTL Lv  3 Term 08/05/89          2 Term 09/09/79          1 Term 10/16/69            Family History: Family History  Problem Relation Age of Onset  . Diabetes Mother   . Breast cancer Neg Hx     Social History: Social History   Socioeconomic History  . Marital status: Divorced    Spouse name: Not on file  . Number of children: Not on file  . Years of education: Not on file  . Highest education level: Not on file  Occupational History  . Not on file  Social Needs  . Financial resource strain: Not on file  . Food insecurity    Worry: Not on file    Inability: Not on file  . Transportation needs    Medical: Not on file    Non-medical: Not on file  Tobacco Use  . Smoking status: Never Smoker  . Smokeless tobacco: Never Used  Substance and Sexual Activity  . Alcohol use: No  . Drug use: No  . Sexual activity: Yes    Birth control/protection: None  Lifestyle  . Physical activity    Days per week: Not on file    Minutes per session: Not on file  . Stress: Not on file  Relationships  . Social Herbalist on phone: Not on file    Gets together: Not on file    Attends religious service: Not on file    Active member of club or organization: Not on file    Attends meetings of clubs or organizations: Not on file    Relationship  status: Not on file  . Intimate partner violence    Fear of current or ex partner: Not on file    Emotionally abused: Not on file    Physically abused: Not on file    Forced sexual activity: Not on file  Other Topics Concern  . Not on file  Social History Narrative  . Not on file    Allergies: Allergies  Allergen Reactions  . Pseudoephedrine Palpitations    Current Medications: Current Outpatient Medications  Medication Sig Dispense Refill  . Albuterol Sulfate 108 (90 Base) MCG/ACT AEPB Inhale into the lungs.    . Cholecalciferol (VITAMIN D-3 PO) Take by mouth once a week.    . meloxicam (MOBIC) 15 MG tablet Take 15 mg by mouth daily.    Marland Kitchen  sertraline (ZOLOFT) 25 MG tablet Take 25 mg by mouth daily.     No current facility-administered medications for this visit.     Review of Systems General: negative for, fevers she has noted weight gain and night sweats Skin: negative for changes in color, texture, moles or lesions Eyes: negative for, changes in vision HEENT: negative for, change in hearing, voice changes Pulmonary: positive for SOB, productive cough Cardiac: negative for, palpitations, pain Gastrointestinal: negative for, nausea, vomiting, constipation, diarrhea, hematemesis, hematochezia Genitourinary/Sexual: negative for, dysuria, stones Ob/Gyn: irregular bleeding Musculoskeletal: pain back and legs Hematology: negative for, easy bruising Neurologic/Psych: positive for weakness and numbness and anxiety, negative for, headaches, seizures, paralysis  Objective:  Physical Examination:  BP (!) 148/68 (BP Location: Left Arm, Patient Position: Sitting)   Pulse 82   Temp (!) 97.5 F (36.4 C) (Tympanic)   Resp 20   Ht 4\' 11"  (1.499 m)   Wt 161 lb 1.6 oz (73.1 kg)   BMI 32.54 kg/m    ECOG Performance Status: 1 - Symptomatic but completely ambulatory  General appearance: alert, cooperative and appears stated age. She walks with support due to right hip  issues HEENT:PERRLA, extra ocular movement intact, sclera clear, anicteric and neck supple with midline trachea Lymph node survey: non-palpable, axillary, inguinal, supraclavicular Cardiovascular: regular rate and rhythm Respiratory: normal air entry, lungs clear to auscultation Abdomen: slightly distended, but soft, non-tender, without masses or organomegaly.  Possible umbilical hernia. Back: inspection of back is normal Extremities: edema 1 + symmetrical otherwise negative Skin exam - normal coloration and turgor, no rashes, no suspicious skin lesions noted. Neurological exam reveals alert, oriented, normal speech, no focal findings   Pelvic: exam chaperoned by nurse;  Vulva: normal appearing vulva with no masses, tenderness or lesions; Vagina: normal vagina; Adnexa: no masses; Uterus: uterus is not grossly enlarged and non-tender and deviated a bit; Cervix: multiparous appearance and grossly abnormal appearing which may be all related to cancer vs ectropion, the lesion appears to be extending to the posterior vagina. The cervix is firm to palpation; Rectal: confirmatory    Assessment:  Kendra Herring is a 58 y.o. female diagnosed with stage IIA invasive squamous cell cancer of the cervix 11/18 with negative CT/PET scan for metastases. Treated with chemoradiation and she received weekly cisplatin and external radiation followed by brachytherapy with complete response.    Surveillance CT scan negative yesterday.  Medical co-morbidities complicating care: history of cardiac arrythmia, s/p hip surgery with mobility issues, and arthritis.   Plan:   Problem List Items Addressed This Visit      Genitourinary   Cervical carcinoma ([place]) - Primary   Squamous cell carcinoma of cervix ([place])     Continue surveillance q3-4 months. She will see Dr Grayland Ormond today and in 6 months and Dr Christel Mormon at [facility] in 3 months.  We will see her back on 1 year or sooner should problems arise.   She has never had  colonoscopy and will schedule this routine screening for colon cancer.    Mellody Drown, MD  CC:  Gae Dry, MD

## 2019-06-05 ENCOUNTER — Ambulatory Visit: Payer: Medicare Other | Admitting: Oncology

## 2019-06-16 ENCOUNTER — Telehealth: Payer: Self-pay

## 2019-06-16 LAB — PAP LB AND HPV HIGH-RISK: HPV, high-risk: NEGATIVE

## 2019-06-16 NOTE — Telephone Encounter (Signed)
Called and notified of negative pap smear results.   NEGATIVE FOR INTRAEPITHELIAL LESION OR MALIGNANCY       HPV, high-risk Negative

## 2019-06-19 ENCOUNTER — Ambulatory Visit: Payer: Medicare Other | Admitting: Radiation Oncology

## 2019-09-16 ENCOUNTER — Other Ambulatory Visit: Payer: Self-pay | Admitting: Family Medicine

## 2019-10-21 ENCOUNTER — Other Ambulatory Visit: Payer: Self-pay | Admitting: Family Medicine

## 2019-10-21 DIAGNOSIS — Z1231 Encounter for screening mammogram for malignant neoplasm of breast: Secondary | ICD-10-CM

## 2019-10-28 ENCOUNTER — Ambulatory Visit
Admission: RE | Admit: 2019-10-28 | Discharge: 2019-10-28 | Disposition: A | Discharge status: Home / Self Care | Payer: Medicare Other | Source: Ambulatory Visit | Attending: Family Medicine | Admitting: Family Medicine

## 2019-10-28 DIAGNOSIS — Z1231 Encounter for screening mammogram for malignant neoplasm of breast: Secondary | ICD-10-CM | POA: Insufficient documentation

## 2019-11-28 NOTE — Progress Notes (Deleted)
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   INTERVAL HISTORY: Patient returns to clinic today for routine 69-month evaluation and discussion of her imaging results.  She continues to have chronic weakness and fatigue, but otherwise feels well and is asymptomatic. She denies any pain today.  She has no neurologic complaints. She denies any recent fevers or illnesses.  She denies any chest pain, shortness of breath, cough, or hemoptysis.  She has no nausea, vomiting, constipation, or diarrhea. She denies any melena or hematochezia. She has no urinary complaints.  Patient offers no further specific complaints today.  REVIEW OF SYSTEMS:   Review of Systems  Constitutional: Positive for malaise/fatigue. Negative for fever and weight loss.  Eyes: Negative.   Respiratory: Negative.  Negative for cough and shortness of breath.   Cardiovascular: Negative.  Negative for chest pain and leg swelling.  Gastrointestinal: Negative.  Negative for abdominal pain, blood in stool, melena, nausea and vomiting.  Genitourinary: Negative.  Negative for hematuria.  Musculoskeletal: Negative.   Skin: Negative.  Negative for rash.  Neurological: Positive for weakness. Negative for sensory change, focal weakness and headaches.  Psychiatric/Behavioral: The patient is nervous/anxious. The patient  does not have insomnia.     As per HPI. Otherwise, a complete review of systems is negative.  PAST MEDICAL HISTORY: Past Medical History:  Diagnosis Date  . Anxiety   . Arrhythmia   . Arthritis   . Asthma   . Cervical cancer ([place]) 03/2017   Rad, chemo and brachytherapy tx's.   . Diabetes mellitus without complication ([place])   . Hernia of abdominal wall 04/17/2017  . Personal history of chemotherapy   . Personal history of radiation therapy     PAST SURGICAL HISTORY: Past Surgical History:  Procedure Laterality Date  . HIP SURGERY    . TUBAL LIGATION      FAMILY HISTORY: Family History  Problem Relation Age of Onset  . Diabetes Mother   . Breast cancer Neg Hx     ADVANCED DIRECTIVES (Y/N):  N  HEALTH MAINTENANCE: Social History   Tobacco Use  . Smoking status: Never Smoker  . Smokeless tobacco: Never Used  Substance Use Topics  . Alcohol use: No  . Drug use: No     Colonoscopy:  PAP:  Bone density:  Lipid panel:  Allergies  Allergen Reactions  . Pseudoephedrine Palpitations    Current Outpatient Medications  Medication Sig Dispense Refill  . Accu-Chek FastClix Lancets MISC USE TID UTD    . ACCU-CHEK GUIDE test strip USE TID UTD    . Albuterol Sulfate 108 (90 Base) MCG/ACT AEPB Inhale into the lungs.    . Cholecalciferol (VITAMIN D-3 PO) Take by mouth once a week.    Marland Kitchen glipiZIDE (GLUCOTROL XL) 10 MG 24 hr tablet TK 1 T PO D FOR DIABETES    . meloxicam (MOBIC) 15 MG tablet Take 15 mg by mouth daily.    . sertraline (ZOLOFT) 25 MG tablet Take  25 mg by mouth daily.    . traZODone (DESYREL) 50 MG tablet TK 1 T PO HS PRF SLP     No current facility-administered medications for this visit.    OBJECTIVE: There were no vitals filed for this visit.   There is no height or weight on file to calculate BMI.    ECOG FS:0 - Asymptomatic  General: Well-developed, well-nourished, no acute distress. Eyes: Pink conjunctiva, anicteric sclera. HEENT:  Normocephalic, moist mucous membranes. Lungs: Clear to auscultation bilaterally. Heart: Regular rate and rhythm. No rubs, murmurs, or gallops. Abdomen: Soft, nontender, nondistended. No organomegaly noted, normoactive bowel sounds. Musculoskeletal: No edema, cyanosis, or clubbing. GYN: Exam performed at today by another provider reported as normal. Neuro: Alert, answering all questions appropriately. Cranial nerves grossly intact. Skin: No rashes or petechiae noted. Psych: Normal affect.  LAB RESULTS:  Lab Results  Component Value Date   NA 139 05/30/2018   K 3.6 05/30/2018   CL 104 05/30/2018   CO2 24 05/30/2018   GLUCOSE 151 (H) 05/30/2018   BUN 16 05/30/2018   CREATININE 0.71 05/30/2018   CALCIUM 9.7 05/30/2018   PROT 7.4 06/09/2017   ALBUMIN 4.1 06/09/2017   AST 28 06/09/2017   ALT 26 06/09/2017   ALKPHOS 69 06/09/2017   BILITOT 0.7 06/09/2017   GFRNONAA >60 05/30/2018   GFRAA >60 05/30/2018    Lab Results  Component Value Date   WBC 5.8 05/30/2018   NEUTROABS 4.2 05/30/2018   HGB 14.3 05/30/2018   HCT 41.7 05/30/2018   MCV 91.2 05/30/2018   PLT 246 05/30/2018     STUDIES: No results found.  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  PLAN:    1. 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 Patient completed her concurrent chemotherapy with cisplatin and XRT on June 06, 2017. She completed brachythe[facility] at [facility] on July 24, 2017.  PET scan results from [facility] on November 07, 2017 reviewed independently with no residual or progressive disease.  Most recent imaging with CT scan on June 03, 2019 reviewed independently and reported as above with no obvious evidence of recurrent or progressive disease.  No further imaging is necessary unless there is suspicion of recurrence.  Return to clinic in 6 months for routine  evaluation.    2.  Anxiety: Continue monitoring and treatment per primary care. 3.  Pain: Patient does not complain of this today.  I spent a total of 20 minutes face-to-face with the patient of which greater than 50% of the visit was spent in counseling and coordination of care as detailed above.   Patient expressed understanding and was in agreement with this plan. She also understands that She can call clinic at any time with any questions, concerns, or complaints.   Cancer Staging Cervical carcinoma [facility]) Staging form: Cervix Uteri, AJCC 8th Edition - Clinical stage from 04/27/2017: FIGO Stage IIA (cT2a, cN0, cM0) - Signed by Lloyd Huger, MD on 04/27/2017   Lloyd Huger, MD   11/28/2019 6:40 AM

## 2019-12-04 ENCOUNTER — Inpatient Hospital Stay: Payer: Medicare Other

## 2019-12-04 ENCOUNTER — Ambulatory Visit: Payer: Medicare Other | Attending: Radiation Oncology | Admitting: Radiation Oncology

## 2019-12-04 ENCOUNTER — Inpatient Hospital Stay: Payer: Medicare Other | Admitting: Oncology

## 2019-12-25 DIAGNOSIS — Z8249 Family history of ischemic heart disease and other diseases of the circulatory system: Secondary | ICD-10-CM | POA: Insufficient documentation

## 2019-12-25 DIAGNOSIS — R0602 Shortness of breath: Secondary | ICD-10-CM | POA: Insufficient documentation

## 2020-03-03 ENCOUNTER — Other Ambulatory Visit: Payer: Self-pay

## 2020-03-03 ENCOUNTER — Inpatient Hospital Stay: Payer: Medicare Other | Attending: Obstetrics and Gynecology | Admitting: Obstetrics and Gynecology

## 2020-03-03 ENCOUNTER — Encounter: Payer: Self-pay | Admitting: Obstetrics and Gynecology

## 2020-03-03 VITALS — BP 123/74 | HR 82 | Temp 95.3°F | Resp 16 | Ht 59.0 in | Wt 156.8 lb

## 2020-03-03 DIAGNOSIS — Z8541 Personal history of malignant neoplasm of cervix uteri: Secondary | ICD-10-CM | POA: Insufficient documentation

## 2020-03-03 DIAGNOSIS — Z9221 Personal history of antineoplastic chemotherapy: Secondary | ICD-10-CM | POA: Diagnosis not present

## 2020-03-03 DIAGNOSIS — Z923 Personal history of irradiation: Secondary | ICD-10-CM

## 2020-03-03 DIAGNOSIS — C539 Malignant neoplasm of cervix uteri, unspecified: Secondary | ICD-10-CM

## 2020-03-03 DIAGNOSIS — Z08 Encounter for follow-up examination after completed treatment for malignant neoplasm: Secondary | ICD-10-CM | POA: Insufficient documentation

## 2020-03-03 NOTE — Progress Notes (Signed)
Pt states that she has spotting at times, the last time was 1 month ago and it was a light abount of blood.

## 2020-03-03 NOTE — Progress Notes (Signed)
Gynecologic Oncology Interval Visit   Referring Provider: Gae Dry, MD  Chief Concern: cervical cancer  Subjective:  Kendra Herring is a 59 y.o. female, initially seen in consultation from Dr. Kenton Kingfisher for new diagnosis of invasive cervical cancer, returns to clinic for surveillance.   Patient returns today for surveillance.  No new complaints.   CT scan 8/20.   IMPRESSION: 1. No evidence of metastatic cervical carcinoma. 2. Stable small enhancing lesion RIGHT hepatic lobe is favored benign. 3. Small external iliac lymph nodes are stable and not pathologic by size criteria.  Oncology history Kendra Herring is a pleasant postmenopausal [phone] who presented to Dr. Kenton Kingfisher evaluation and management of abnormal cervical cytology, AGUS, favor high risk cells.  Pt also had abnormal exam appearance of cervix on exam.    Colposcopy performed with 4% acetic acid as well as EMBx. Exam notable for the following: Aceto-white Lesions Location(s): throughout, abnormal appearance, contact bleeding               Part A: CERVIX, 6:00, BIOPSY:  INVASIVE SQUAMOUS CELL CARCINOMA.  Part B: CERVIX, 10:00, BIOPSY:  HIGH-GRADE SQUAMOUS INTRAEPITHELIAL LESION (CIN 3).  CHRONIC CERVICITIS WITH SQUAMOUS METAPLASIA.  Part C: CERVIX, 12:00, BIOPSY:  FOCAL HIGH-GRADE SQUAMOUS INTRAEPITHELIAL LESION (CIN 3).  CHRONIC CERVICITIS.  Part D: ENDOCERVIX, CURETTINGS:  INVASIVE SQUAMOUS CELL CARCINOMA.  FRAGMENTED BIOPSY SPECIMEN.  COMMENT: THIS CASE WAS REVIEWED IN INTRADEPARTMENTAL CONSULTATION AND THE  DIAGNOSIS REFLECTS OUR CONSENSUS OPINION. THE RESULTS WERE CALLED TO  Kendra Herring'S OFFICE BY Kendra Herring AND GIVEN TO Kendra Herring ON 04/02/2017.I  MQA/04/02/2017   ENDOMETRIUM, BIOPSY:  SCANT FRAGMENTS OF INACTIVE ENDOMETRIUM, MIXED WITH MINUTE FRAGMENTS OF  BENIGN ENDOCERVICAL GLANDS, MUCUS, AND BLOOD. NO HYPERPLASIA OR  CARCINOMA.   She was seen in the ER 03/28/2017 for back pain. Workup included   CT scan -  dedicated renal protocol: 1. Negative for nephrolithiasis, hydronephrosis or ureteral stone. 2. There are no acute abnormalities visualized.  Dr Theora Gianotti examined patient and felt it was difficult to feel the tumor and get a sense of its size because it is inside the canal.  And she was concerned about her back pain possibly being due to metastatic disease.   PET/CT 04/16/17 IMPRESSION:  I reviewed images with the radiologist on the phone. 1. Small focus of abnormal accentuated activity along the posterior cervix, potentially a site of focal neoplastic disease/malignancy. No pathologic adenopathy or evidence of metastatic spread. 2. Right total hip prosthesis noted with cerclage wires along the stem component. 3. Lumbar spondylosis and degenerative disc disease with potential impingement at L3-4 and L4-5.   MRI IMPRESSION: Image degradation by artifact from right hip prosthesis noted.  Subtle ill-defined 1.9 cm mass in the posterior wall of the cervix, consistent with known cervical carcinoma.  No evidence of extra-cervical tumor extension or pelvic lymphadenopathy.  She was seen at [facility] for consultation and stage was felt to be IIA due to extension of the tumor into posterior vagina.  Decision was made to treat her with chemoradiation and see received weekly cisplatin and external radiation followed by brachytherapy with complete response.  Problem List: Patient Active Problem List   Diagnosis Date Noted  . Anxiety disorder, unspecified 04/30/2017  . Squamous cell carcinoma of cervix ([place]) 04/30/2017  . Cervical carcinoma ([place]) 04/06/2017  . Hip pain 05/02/2016  . History of total hip arthroplasty 05/02/2016    Past Medical History: Past Medical History:  Diagnosis Date  . Anxiety   . Arrhythmia   .  Arthritis   . Asthma   . Cervical cancer ([place]) 03/2017   Rad, chemo and brachytherapy tx's.   . Diabetes mellitus without complication ([place])   . Hernia of abdominal wall  04/17/2017  . Personal history of chemotherapy   . Personal history of radiation therapy     Past Surgical History: Past Surgical History:  Procedure Laterality Date  . HIP SURGERY    . TUBAL LIGATION      Past Gynecologic History:  Menarche: 15 Menstrual details: postmenopausal Menses regular: no Last Menstrual Period: a year ago History of Abnormal pap: yes, glandular cell abnormality (AGUS) Last pap: see HPI Contraception: bilateral tubal ligation   OB History:  OB History  Gravida Para Term Preterm AB Living  3 3 3     3   SAB TAB Ectopic Multiple Live Births               # Outcome Date GA Lbr Len/2nd Weight Sex Delivery Anes PTL Lv  3 Term 08/05/89          2 Term 09/09/79          1 Term 10/16/69            Family History: Family History  Problem Relation Age of Onset  . Diabetes Mother   . Breast cancer Neg Hx     Social History: Social History   Socioeconomic History  . Marital status: Divorced    Spouse name: Not on file  . Number of children: Not on file  . Years of education: Not on file  . Highest education level: Not on file  Occupational History  . Not on file  Tobacco Use  . Smoking status: Never Smoker  . Smokeless tobacco: Never Used  Substance and Sexual Activity  . Alcohol use: No  . Drug use: No  . Sexual activity: Not Currently    Birth control/protection: None  Other Topics Concern  . Not on file  Social History Narrative  . Not on file   Social Determinants of Health   Financial Resource Strain:   . Difficulty of Paying Living Expenses:   Food Insecurity:   . Worried About Charity fundraiser in the Last Year:   . Arboriculturist in the Last Year:   Transportation Needs:   . Film/video editor (Medical):   Marland Kitchen Lack of Transportation (Non-Medical):   Physical Activity:   . Days of Exercise per Week:   . Minutes of Exercise per Session:   Stress:   . Feeling of Stress :   Social Connections:   . Frequency of  Communication with Friends and Family:   . Frequency of Social Gatherings with Friends and Family:   . Attends Religious Services:   . Active Member of Clubs or Organizations:   . Attends Archivist Meetings:   Marland Kitchen Marital Status:   Intimate Partner Violence:   . Fear of Current or Ex-Partner:   . Emotionally Abused:   Marland Kitchen Physically Abused:   . Sexually Abused:     Allergies: Allergies  Allergen Reactions  . Pseudoephedrine Palpitations    Current Medications: Current Outpatient Medications  Medication Sig Dispense Refill  . Accu-Chek FastClix Lancets MISC USE TID UTD    . ACCU-CHEK GUIDE test strip USE TID UTD    . Albuterol Sulfate 108 (90 Base) MCG/ACT AEPB Inhale 2 puffs into the lungs every 4 (four) hours as needed.     . Cholecalciferol (VITAMIN  D-3 PO) Take by mouth once a week.    . meloxicam (MOBIC) 15 MG tablet Take 15 mg by mouth daily.    . sertraline (ZOLOFT) 25 MG tablet Take 25 mg by mouth daily.    . traZODone (DESYREL) 50 MG tablet Take 50 mg by mouth at bedtime as needed.     Marland Kitchen glipiZIDE (GLUCOTROL XL) 10 MG 24 hr tablet TK 1 T PO D FOR DIABETES     No current facility-administered medications for this visit.    Review of Systems General: negative for, fevers she has noted weight gain and night sweats Skin: negative for changes in color, texture, moles or lesions Eyes: negative for, changes in vision HEENT: negative for, change in hearing, voice changes Pulmonary: positive for SOB, productive cough Cardiac: negative for, palpitations, pain Gastrointestinal: negative for, nausea, vomiting, constipation, diarrhea, hematemesis, hematochezia Genitourinary/Sexual: negative for, dysuria, stones Ob/Gyn: irregular bleeding Musculoskeletal: pain back and legs Hematology: negative for, easy bruising Neurologic/Psych: positive for weakness and numbness and anxiety, negative for, headaches, seizures, paralysis  Objective:  Physical Examination:  BP  123/74   Pulse 82   Temp (!) 95.3 F (35.2 C) (Oral)   Resp 16   Ht 4\' 11"  (1.499 m)   Wt 156 lb 12.8 oz (71.1 kg)   BMI 31.67 kg/m    ECOG Performance Status: 1 - Symptomatic but completely ambulatory  General appearance: alert, cooperative and appears stated age. She walks with support due to right hip issues HEENT:PERRLA, extra ocular movement intact, sclera clear, anicteric and neck supple with midline trachea Lymph node survey: non-palpable, axillary, inguinal, supraclavicular Cardiovascular: regular rate and rhythm Respiratory: normal air entry, lungs clear to auscultation Abdomen: slightly distended, but soft, non-tender, without masses or organomegaly.  Possible umbilical hernia. Back: inspection of back is normal Extremities: edema 1 + symmetrical otherwise negative Skin exam - normal coloration and turgor, no rashes, no suspicious skin lesions noted. Neurological exam reveals alert, oriented, normal speech, no focal findings   Pelvic: exam chaperoned by nurse;  Vulva: normal appearing vulva with no masses, tenderness or lesions; Vagina: normal vagina; Adnexa: no masses; Uterus: uterus is not grossly enlarged and non-tender and deviated a bit; Cervix: multiparous appearance and grossly abnormal appearing which may be all related to cancer vs ectropion, the lesion appears to be extending to the posterior vagina. The cervix is firm to palpation; Rectal: confirmatory    Assessment:  Kendra Herring is a 59 y.o. female diagnosed with stage IIA invasive squamous cell cancer of the cervix 11/18 with negative CT/PET scan for metastases. Treated with chemoradiation and she received weekly cisplatin and external radiation followed by brachytherapy with complete response.    Surveillance CT scan negative 8/20.  NED on exam today.  Medical co-morbidities complicating care: history of cardiac arrythmia, s/p hip surgery with mobility issues, and arthritis.   Plan:   Problem List Items  Addressed This Visit      Genitourinary   Cervical carcinoma ([place]) - Primary     Continue surveillance q3-4 months. She will see Dr Baruch Gouty in 3 months.  We will see her back in 6 months or sooner should problems arise.   She has never had colonoscopy and will call her and encourage her to schedule this routine screening for colon cancer.     Mellody Drown, MD   CC:  Gae Dry, MD

## 2020-06-17 ENCOUNTER — Ambulatory Visit
Admission: RE | Admit: 2020-06-17 | Discharge: 2020-06-17 | Disposition: A | Discharge status: Home / Self Care | Payer: Medicare Other | Source: Ambulatory Visit | Attending: Radiation Oncology | Admitting: Radiation Oncology

## 2020-06-17 ENCOUNTER — Other Ambulatory Visit: Payer: Self-pay

## 2020-06-17 VITALS — BP 128/73 | HR 73 | Temp 97.0°F | Resp 16 | Wt 152.4 lb

## 2020-06-17 DIAGNOSIS — Z923 Personal history of irradiation: Secondary | ICD-10-CM | POA: Insufficient documentation

## 2020-06-17 DIAGNOSIS — Z8542 Personal history of malignant neoplasm of other parts of uterus: Secondary | ICD-10-CM | POA: Insufficient documentation

## 2020-06-17 DIAGNOSIS — C539 Malignant neoplasm of cervix uteri, unspecified: Secondary | ICD-10-CM

## 2020-06-17 DIAGNOSIS — Z9221 Personal history of antineoplastic chemotherapy: Secondary | ICD-10-CM | POA: Insufficient documentation

## 2020-06-17 NOTE — Progress Notes (Signed)
Radiation Oncology Follow up Note  Name: Kendra Herring   Date:   06/17/2020 MRN:  [id number] DOB: 1961/06/17    This 59 y.o. female presents to the clinic today for 3-year follow-up status post concurrent chemoradiation therapy including external beam treatment as well as brachytherapy for stage IIa squamous cell carcinoma of the cervix.  REFERRING PROVIDER: Letta Median, MD  HPI: Patient is a 59 year old female now out 3 years having completed concurrent chemoradiation therapy for stage IIa squamous cell carcinoma of the cervix.  She had both external beam radiation therapy as well as brachytherapy.  Seen today in routine follow-up she is doing well.  She specifically denies any increased lower urinary tract symptoms diarrhea fatigue or vaginal discharge..  Her surveillance CT scan back in August 2020 was unremarkable for any evidence of disease.  She had a pelvic exam several months ago by Dr. Fransisca Connors showing no evidence of disease.  COMPLICATIONS OF TREATMENT: none  FOLLOW UP COMPLIANCE: keeps appointments   PHYSICAL EXAM:  BP 128/73 (BP Location: Right Arm, Patient Position: Sitting, Cuff Size: Normal)   Pulse 73   Temp (!) 97 F (36.1 C) (Tympanic)   Resp 16   Wt 152 lb 6.4 oz (69.1 kg)   BMI 30.78 kg/m  Well-developed well-nourished patient in NAD. HEENT reveals PERLA, EOMI, discs not visualized.  Oral cavity is clear. No oral mucosal lesions are identified. Neck is clear without evidence of cervical or supraclavicular adenopathy. Lungs are clear to A&P. Cardiac examination is essentially unremarkable with regular rate and rhythm without murmur rub or thrill. Abdomen is benign with no organomegaly or masses noted. Motor sensory and DTR levels are equal and symmetric in the upper and lower extremities. Cranial nerves II through XII are grossly intact. Proprioception is intact. No peripheral adenopathy or edema is identified. No motor or sensory levels are noted. Crude visual  fields are within normal range.  RADIOLOGY RESULTS: Prior CT scan reviewed compatible with above-stated findings  PLAN: This time patient continues to do well with no evidence of disease.  On return follow-up care over to GYN oncology.  She continues under active observation.  I would be happy to reevaluate the patient in time should further radiation oncology opinion be indicated.  Patient is comfortable being continue to followed by GYN oncology.  I would like to take this opportunity to thank you for allowing me to participate in the care of your patient.Noreene Filbert, MD

## 2020-09-01 ENCOUNTER — Encounter (INDEPENDENT_AMBULATORY_CARE_PROVIDER_SITE_OTHER): Payer: Self-pay

## 2020-09-01 ENCOUNTER — Other Ambulatory Visit: Payer: Self-pay

## 2020-09-01 ENCOUNTER — Encounter: Payer: Self-pay | Admitting: Obstetrics and Gynecology

## 2020-09-01 ENCOUNTER — Inpatient Hospital Stay: Payer: Medicare Other | Attending: Obstetrics and Gynecology | Admitting: Obstetrics and Gynecology

## 2020-09-01 VITALS — BP 122/63 | HR 92 | Temp 98.0°F | Resp 20 | Wt 151.4 lb

## 2020-09-01 DIAGNOSIS — N939 Abnormal uterine and vaginal bleeding, unspecified: Secondary | ICD-10-CM | POA: Insufficient documentation

## 2020-09-01 DIAGNOSIS — F419 Anxiety disorder, unspecified: Secondary | ICD-10-CM | POA: Insufficient documentation

## 2020-09-01 DIAGNOSIS — E119 Type 2 diabetes mellitus without complications: Secondary | ICD-10-CM | POA: Insufficient documentation

## 2020-09-01 DIAGNOSIS — Z79899 Other long term (current) drug therapy: Secondary | ICD-10-CM | POA: Diagnosis not present

## 2020-09-01 DIAGNOSIS — Z923 Personal history of irradiation: Secondary | ICD-10-CM | POA: Insufficient documentation

## 2020-09-01 DIAGNOSIS — Z9221 Personal history of antineoplastic chemotherapy: Secondary | ICD-10-CM | POA: Diagnosis not present

## 2020-09-01 DIAGNOSIS — Z7984 Long term (current) use of oral hypoglycemic drugs: Secondary | ICD-10-CM | POA: Diagnosis not present

## 2020-09-01 DIAGNOSIS — R102 Pelvic and perineal pain: Secondary | ICD-10-CM | POA: Insufficient documentation

## 2020-09-01 DIAGNOSIS — C539 Malignant neoplasm of cervix uteri, unspecified: Secondary | ICD-10-CM | POA: Diagnosis not present

## 2020-09-01 NOTE — Progress Notes (Signed)
Gynecologic Oncology Interval Visit   Referring Provider: Gae Dry, MD  Chief Concern: cervical cancer  Subjective:  Kendra Herring is a 59 y.o. female, initially seen in consultation from Dr. Kenton Herring for new diagnosis of invasive cervical cancer, returns to clinic for surveillance.   Patient returns today for surveillance.  No new complaints.   Oncology history Kendra Herring is a pleasant postmenopausal [phone] who presented to Dr. Kenton Herring evaluation and management of abnormal cervical cytology, AGUS, favor high risk cells.  Pt also had abnormal exam appearance of cervix on exam.    Colposcopy performed with 4% acetic acid as well as EMBx. Exam notable for the following: Aceto-white Lesions Location(s): throughout, abnormal appearance, contact bleeding  Pathology- 03/28/2017:         Part A: CERVIX, 6:00, BIOPSY: INVASIVE SQUAMOUS CELL CARCINOMA.  Part B: CERVIX, 10:00, BIOPSY: HIGH-GRADE SQUAMOUS INTRAEPITHELIAL LESION (CIN 3). CHRONIC CERVICITIS WITH SQUAMOUS METAPLASIA.  Part C: CERVIX, 12:00, BIOPSY: FOCAL HIGH-GRADE SQUAMOUS INTRAEPITHELIAL LESION (CIN 3). CHRONIC CERVICITIS.  Part D: ENDOCERVIX, CURETTINGS: INVASIVE SQUAMOUS CELL CARCINOMA. FRAGMENTED BIOPSY SPECIMEN.   ENDOMETRIUM, BIOPSY:  SCANT FRAGMENTS OF INACTIVE ENDOMETRIUM, MIXED WITH MINUTE FRAGMENTS OF  BENIGN ENDOCERVICAL GLANDS, MUCUS, AND BLOOD. NO HYPERPLASIA OR  CARCINOMA.   She was seen in the ER 03/28/2017 for back pain. Workup included   CT scan - dedicated renal protocol: 1. Negative for nephrolithiasis, hydronephrosis or ureteral stone. 2. There are no acute abnormalities visualized.  Dr Kendra Herring examined patient and felt it was difficult to feel the tumor and get a sense of its size because it is inside the canal.  And she was concerned about her back pain possibly being due to metastatic disease.   PET/CT 04/16/17 IMPRESSION:  I reviewed images with the radiologist on the phone. 1. Small focus of  abnormal accentuated activity along the posterior cervix, potentially a site of focal neoplastic disease/malignancy. No pathologic adenopathy or evidence of metastatic spread. 2. Right total hip prosthesis noted with cerclage wires along the stem component. 3. Lumbar spondylosis and degenerative disc disease with potential impingement at L3-4 and L4-5.   MRI IMPRESSION: Image degradation by artifact from right hip prosthesis noted.  Subtle ill-defined 1.9 cm mass in the posterior wall of the cervix, consistent with known cervical carcinoma.  No evidence of extra-cervical tumor extension or pelvic lymphadenopathy.  She was seen at [facility] for consultation and stage was felt to be IIA due to extension of the tumor into posterior vagina.  Decision was made to treat her with chemoradiation and see received weekly cisplatin (05/09/2017-06/06/2017; 5 cycles) and external radiation (05/08/2017-06/21/2017) followed by brachytherapy (07/04/2017-07/24/2017) with complete response.  CT scan 8/20.   IMPRESSION: 1. No evidence of metastatic cervical carcinoma. 2. Stable small enhancing lesion RIGHT hepatic lobe is favored benign. 3. Small external iliac lymph nodes are stable and not pathologic by size criteria.  Problem List: Patient Active Problem List   Diagnosis Date Noted  . Exertional shortness of breath 12/25/2019  . Family history of coronary artery disease 12/25/2019  . Strain of rotator cuff capsule 11/12/2017  . Anxiety disorder 04/30/2017  . 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 Cervical carcinoma ([place]) 04/06/2017  . Hip pain 05/02/2016  . History of total hip arthroplasty 05/02/2016    Past Medical History: Past Medical History:  Diagnosis Date  . Anxiety   . Arrhythmia   . Arthritis   . Asthma   . Cervical cancer ([place]) 03/2017  Rad, chemo and brachytherapy tx's.   . Diabetes mellitus without complication ([place])   . Hernia of abdominal wall 04/17/2017  . Personal  history of chemotherapy   . Personal history of radiation therapy     Past Surgical History: Past Surgical History:  Procedure Laterality Date  . HIP SURGERY    . TUBAL LIGATION      Past Gynecologic History:  Menarche: 15 Menstrual details: postmenopausal Menses regular: no Last Menstrual Period: a year ago History of Abnormal pap: yes, glandular cell abnormality (AGUS) Last pap: see HPI Contraception: bilateral tubal ligation   OB History:  OB History  Gravida Para Term Preterm AB Living  3 3 3     3   SAB TAB Ectopic Multiple Live Births               # Outcome Date GA Lbr Len/2nd Weight Sex Delivery Anes PTL Lv  3 Term 08/05/89          2 Term 09/09/79          1 Term 10/16/69            Family History: Family History  Problem Relation Age of Onset  . Diabetes Mother   . Breast cancer Neg Hx     Social History: Social History   Socioeconomic History  . Marital status: Divorced    Spouse name: Not on file  . Number of children: Not on file  . Years of education: Not on file  . Highest education level: Not on file  Occupational History  . Not on file  Tobacco Use  . Smoking status: Never Smoker  . Smokeless tobacco: Never Used  Vaping Use  . Vaping Use: Never used  Substance and Sexual Activity  . Alcohol use: No  . Drug use: No  . Sexual activity: Not Currently    Birth control/protection: None  Other Topics Concern  . Not on file  Social History Narrative  . Not on file   Social Determinants of Health   Financial Resource Strain:   . Difficulty of Paying Living Expenses: Not on file  Food Insecurity:   . Worried About Charity fundraiser in the Last Year: Not on file  . Ran Out of Food in the Last Year: Not on file  Transportation Needs:   . Lack of Transportation (Medical): Not on file  . Lack of Transportation (Non-Medical): Not on file  Physical Activity:   . Days of Exercise per Week: Not on file  . Minutes of Exercise per Session:  Not on file  Stress:   . Feeling of Stress : Not on file  Social Connections:   . Frequency of Communication with Friends and Family: Not on file  . Frequency of Social Gatherings with Friends and Family: Not on file  . Attends Religious Services: Not on file  . Active Member of Clubs or Organizations: Not on file  . Attends Archivist Meetings: Not on file  . Marital Status: Not on file  Intimate Partner Violence:   . Fear of Current or Ex-Partner: Not on file  . Emotionally Abused: Not on file  . Physically Abused: Not on file  . Sexually Abused: Not on file    Allergies: Allergies  Allergen Reactions  . Pseudoephedrine Palpitations    Current Medications: Current Outpatient Medications  Medication Sig Dispense Refill  . Accu-Chek FastClix Lancets MISC USE TID UTD    . ACCU-CHEK GUIDE test strip USE TID  UTD    . Albuterol Sulfate 108 (90 Base) MCG/ACT AEPB Inhale 2 puffs into the lungs every 4 (four) hours as needed.     . B-D UF III MINI PEN NEEDLES 31G X 5 MM MISC Inject into the skin daily.    . Cholecalciferol 1.25 MG (50000 UT) capsule Take 1 capsule by mouth once a week.    . escitalopram (LEXAPRO) 10 MG tablet Take 10 mg by mouth daily.    Marland Kitchen FLOVENT HFA 220 MCG/ACT inhaler Inhale 2 puffs into the lungs 2 (two) times daily.    Marland Kitchen glipiZIDE (GLUCOTROL XL) 10 MG 24 hr tablet TK 1 T PO D FOR DIABETES    . JARDIANCE 10 MG TABS tablet Take 10 mg by mouth daily.    . meloxicam (MOBIC) 15 MG tablet Take 15 mg by mouth daily.    . montelukast (SINGULAIR) 10 MG tablet Take 10 mg by mouth daily.    . Olopatadine HCl 0.2 % SOLN Apply 1 drop to eye daily.    . sertraline (ZOLOFT) 25 MG tablet Take 25 mg by mouth daily.    . traZODone (DESYREL) 50 MG tablet Take 50 mg by mouth at bedtime as needed.     Marland Kitchen VICTOZA 18 MG/3ML SOPN Inject into the skin.     No current facility-administered medications for this visit.   Review of Systems General:  fatigued Skin: skin  problems Eyes: no complaints HEENT: no complaints Breasts: no complaints Pulmonary: cough Cardiac: no complaints Gastrointestinal: diarrhea Genitourinary/Sexual: no complaints Ob/Gyn: vaginal bleeding, pelvic pain Musculoskeletal: no complaints Hematology: no complaints Neurologic/Psych: numbness & tingling, depression  Objective:  Physical Examination:  BP 122/63   Pulse 92   Temp 98 F (36.7 C)   Resp 20   Wt 151 lb 6.4 oz (68.7 kg)   SpO2 97%   BMI 30.58 kg/m    ECOG Performance Status: 1 - Symptomatic but completely ambulatory  GENERAL: Patient is a well appearing female in no acute distress HEENT:  Sclera clear. Anicteric NODES:  Negative axillary, supraclavicular, inguinal lymph node survery LUNGS:  Clear to auscultation bilaterally.   HEART:  Regular rate and rhythm.  ABDOMEN:  Soft, nontender.  Possible umbilical hernia. No masses or ascites EXTREMITIES:  No peripheral edema. Atraumatic. No cyanosis SKIN:  Clear with no obvious rashes or skin changes.  NEURO:  Nonfocal. Well oriented.  Appropriate affect.  Pelvic: exam chaperoned by nurse;  Vulva: normal appearing vulva with no masses, tenderness or lesions; Vagina: normal vagina; Adnexa: no masses; Uterus: uterus is not grossly enlarged and non-tender and deviated a bit; Cervix: ; Rectal: confirmatory, no masses or nodularity    Assessment:  KEALOHILANI MAIORINO is a 59 y.o. 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 Treated with chemoradiation and she received weekly cisplatin and external radiation followed by brachytherapy with complete response.    Surveillance CT scan negative 8/20.  NED on exam today.  Medical co-morbidities complicating care: history of cardiac arrythmia, s/p hip surgery with mobility issues, and arthritis.   Plan:   Problem List Items Addressed This Visit      Genitourinary   Cervical carcinoma ([place]) - Primary      Continue surveillance q4 months. She would prefer not to go to [facility] to see Dr Christel Mormon and it is fine for her to just follow up here.   No cytology screening or imaging recommended by [employer]  unless clinically indicated.   She has never had colonoscopy and will call her and encourage her to schedule this routine screening for colon cancer.     Kendra Drown, MD  CC:  Kendra Dry, MD

## 2020-12-14 ENCOUNTER — Other Ambulatory Visit: Payer: Self-pay | Admitting: Family Medicine

## 2020-12-14 DIAGNOSIS — Z1231 Encounter for screening mammogram for malignant neoplasm of breast: Secondary | ICD-10-CM

## 2020-12-15 ENCOUNTER — Other Ambulatory Visit: Payer: Self-pay

## 2020-12-15 ENCOUNTER — Ambulatory Visit
Admission: RE | Admit: 2020-12-15 | Discharge: 2020-12-15 | Disposition: A | Discharge status: Home / Self Care | Payer: Medicare Other | Source: Ambulatory Visit | Attending: Family Medicine | Admitting: Family Medicine

## 2020-12-15 DIAGNOSIS — Z1231 Encounter for screening mammogram for malignant neoplasm of breast: Secondary | ICD-10-CM | POA: Insufficient documentation

## 2021-01-05 ENCOUNTER — Inpatient Hospital Stay: Payer: Medicare Other | Attending: Obstetrics and Gynecology | Admitting: Obstetrics and Gynecology

## 2021-01-05 VITALS — BP 118/91 | HR 96 | Temp 97.8°F | Resp 20 | Wt 149.6 lb

## 2021-01-05 DIAGNOSIS — Z8249 Family history of ischemic heart disease and other diseases of the circulatory system: Secondary | ICD-10-CM | POA: Insufficient documentation

## 2021-01-05 DIAGNOSIS — M47816 Spondylosis without myelopathy or radiculopathy, lumbar region: Secondary | ICD-10-CM | POA: Insufficient documentation

## 2021-01-05 DIAGNOSIS — R232 Flushing: Secondary | ICD-10-CM | POA: Insufficient documentation

## 2021-01-05 DIAGNOSIS — Z79899 Other long term (current) drug therapy: Secondary | ICD-10-CM | POA: Insufficient documentation

## 2021-01-05 DIAGNOSIS — C539 Malignant neoplasm of cervix uteri, unspecified: Secondary | ICD-10-CM | POA: Diagnosis present

## 2021-01-05 DIAGNOSIS — G4700 Insomnia, unspecified: Secondary | ICD-10-CM | POA: Diagnosis not present

## 2021-01-05 DIAGNOSIS — Z923 Personal history of irradiation: Secondary | ICD-10-CM | POA: Insufficient documentation

## 2021-01-05 DIAGNOSIS — Z833 Family history of diabetes mellitus: Secondary | ICD-10-CM | POA: Diagnosis not present

## 2021-01-05 NOTE — Patient Instructions (Signed)
Please contact Dr. Boneta Lucks office to move appointment to July 2022. We will plan to see you back November 2022. Please contact the office if you have concerning symptoms in the interim. It was a pleasure seeing you today and thank you for allowing me to participate in your care. -Beckey Rutter, NP

## 2021-01-05 NOTE — Progress Notes (Signed)
Gynecologic Oncology Interval Visit   Referring Provider: Gae Dry, MD   Chief Concern: cervical cancer  Subjective:  Kendra Herring is a 60 y.o. female, initially seen in consultation from Dr. Kenton Kingfisher for new diagnosis of invasive cervical cancer, returns to clinic for surveillance.   She reports mood swings, hot flashes, insomnia. She's had this in the past but continues to be bothersome. Takes trazodone and lexapro. She had an episode of vaginal bleeding, saturated pad and has previously reported post intercourse spotting. No abdominal pain.   Oncology history Kendra Herring is a pleasant postmenopausal [phone] who presented to Dr. Kenton Kingfisher evaluation and management of abnormal cervical cytology, AGUS, favor high risk cells.  Pt also had abnormal exam appearance of cervix on exam.    Colposcopy performed with 4% acetic acid as well as EMBx. Exam notable for the following: Aceto-white Lesions Location(s): throughout, abnormal appearance, contact bleeding  Pathology- 03/28/2017:         Part A: CERVIX, 6:00, BIOPSY: INVASIVE SQUAMOUS CELL CARCINOMA.  Part B: CERVIX, 10:00, BIOPSY: HIGH-GRADE SQUAMOUS INTRAEPITHELIAL LESION (CIN 3). CHRONIC CERVICITIS WITH SQUAMOUS METAPLASIA.  Part C: CERVIX, 12:00, BIOPSY: FOCAL HIGH-GRADE SQUAMOUS INTRAEPITHELIAL LESION (CIN 3). CHRONIC CERVICITIS.  Part D: ENDOCERVIX, CURETTINGS: INVASIVE SQUAMOUS CELL CARCINOMA. FRAGMENTED BIOPSY SPECIMEN.   ENDOMETRIUM, BIOPSY:  SCANT FRAGMENTS OF INACTIVE ENDOMETRIUM, MIXED WITH MINUTE FRAGMENTS OF  BENIGN ENDOCERVICAL GLANDS, MUCUS, AND BLOOD. NO HYPERPLASIA OR  CARCINOMA.   She was seen in the ER 03/28/2017 for back pain. Workup included   CT scan - dedicated renal protocol: 1. Negative for nephrolithiasis, hydronephrosis or ureteral stone. 2. There are no acute abnormalities visualized.  Dr Theora Gianotti examined patient and felt it was difficult to feel the tumor and get a sense of its size because it is inside  the canal.  And she was concerned about her back pain possibly being due to metastatic disease.   PET/CT 04/16/17 IMPRESSION:  I reviewed images with the radiologist on the phone. 1. Small focus of abnormal accentuated activity along the posterior cervix, potentially a site of focal neoplastic disease/malignancy. No pathologic adenopathy or evidence of metastatic spread. 2. Right total hip prosthesis noted with cerclage wires along the stem component. 3. Lumbar spondylosis and degenerative disc disease with potential impingement at L3-4 and L4-5.   MRI IMPRESSION: Image degradation by artifact from right hip prosthesis noted.  Subtle ill-defined 1.9 cm mass in the posterior wall of the cervix, consistent with known cervical carcinoma.  No evidence of extra-cervical tumor extension or pelvic lymphadenopathy.  [facility] was [facility] for [facility] and stage was felt to be IIA due to extension of the tumor into posterior vagina.  Decision was made to treat her with chemoradiation and see received weekly cisplatin (05/09/2017-06/06/2017; 5 cycles) and external radiation (05/08/2017-06/21/2017) followed by brachytherapy (07/04/2017-07/24/2017) with complete response.  CT scan 8/20.   IMPRESSION: 1. No evidence of metastatic cervical carcinoma. 2. Stable small enhancing lesion RIGHT hepatic lobe is favored benign. 3. Small external iliac lymph nodes are stable and not pathologic by size criteria.  Problem List: Patient Active Problem List   Diagnosis Date Noted  . Exertional shortness of breath 12/25/2019  . Family history of coronary artery disease 12/25/2019  . Strain of rotator cuff capsule 11/12/2017  . Anxiety disorder 04/30/2017  . 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 Cervical carcinoma ([place]) 04/06/2017  . Hip pain 05/02/2016  . History of total hip arthroplasty 05/02/2016  Past Medical History: Past Medical History:  Diagnosis Date  . Anxiety   . Arrhythmia   .  Arthritis   . Asthma   . Cervical cancer ([place]) 03/2017   Rad, chemo and brachytherapy tx's.   . Diabetes mellitus without complication ([place])   . Hernia of abdominal wall 04/17/2017  . Personal history of chemotherapy   . Personal history of radiation therapy    cervical cancer    Past Surgical History: Past Surgical History:  Procedure Laterality Date  . HIP SURGERY    . TUBAL LIGATION      Past Gynecologic History:  Menarche: 15 Menstrual details: postmenopausal Menses regular: no Last Menstrual Period: a year ago History of Abnormal pap: yes, glandular cell abnormality (AGUS) Last pap: see HPI Contraception: bilateral tubal ligation   OB History:  OB History  Gravida Para Term Preterm AB Living  3 3 3     3   SAB IAB Ectopic Multiple Live Births               # Outcome Date GA Lbr Len/2nd Weight Sex Delivery Anes PTL Lv  3 Term 08/05/89          2 Term 09/09/79          1 Term 10/16/69            Family History: Family History  Problem Relation Age of Onset  . Diabetes Mother   . Breast cancer Neg Hx     Social History: Social History   Socioeconomic History  . Marital status: Divorced    Spouse name: Not on file  . Number of children: Not on file  . Years of education: Not on file  . Highest education level: Not on file  Occupational History  . Not on file  Tobacco Use  . Smoking status: Never Smoker  . Smokeless tobacco: Never Used  Vaping Use  . Vaping Use: Never used  Substance and Sexual Activity  . Alcohol use: No  . Drug use: No  . Sexual activity: Not Currently    Birth control/protection: None  Other Topics Concern  . Not on file  Social History Narrative  . Not on file   Social Determinants of Health   Financial Resource Strain: Not on file  Food Insecurity: Not on file  Transportation Needs: Not on file  Physical Activity: Not on file  Stress: Not on file  Social Connections: Not on file  Intimate Partner Violence: Not on  file    Allergies: Allergies  Allergen Reactions  . Pseudoephedrine Palpitations    Current Medications: Current Outpatient Medications  Medication Sig Dispense Refill  . Accu-Chek FastClix Lancets MISC USE TID UTD    . ACCU-CHEK GUIDE test strip USE TID UTD    . Albuterol Sulfate 108 (90 Base) MCG/ACT AEPB Inhale 2 puffs into the lungs every 4 (four) hours as needed.     . B-D UF III MINI PEN NEEDLES 31G X 5 MM MISC Inject into the skin daily.    . Cholecalciferol 1.25 MG (50000 UT) capsule Take 1 capsule by mouth once a week.    . escitalopram (LEXAPRO) 10 MG tablet Take 10 mg by mouth daily.    Marland Kitchen FLOVENT HFA 220 MCG/ACT inhaler Inhale 2 puffs into the lungs 2 (two) times daily.    Marland Kitchen glipiZIDE (GLUCOTROL XL) 10 MG 24 hr tablet TK 1 T PO D FOR DIABETES    . JARDIANCE 10 MG TABS tablet Take  10 mg by mouth daily.    . meloxicam (MOBIC) 15 MG tablet Take 15 mg by mouth daily.    . montelukast (SINGULAIR) 10 MG tablet Take 10 mg by mouth daily.    . Olopatadine HCl 0.2 % SOLN Apply 1 drop to eye daily.    . sertraline (ZOLOFT) 25 MG tablet Take 25 mg by mouth daily.    . traZODone (DESYREL) 50 MG tablet Take 50 mg by mouth at bedtime as needed.     Marland Kitchen VICTOZA 18 MG/3ML SOPN Inject into the skin.     No current facility-administered medications for this visit.   Review of Systems General: fatigue, hot flashes Skin: no complaints Eyes: no complaints HEENT: no complaints Breasts: no complaints Pulmonary: no complaints Cardiac: no complaints Gastrointestinal: abdominal bloating, diarrhea Genitourinary/Sexual: no complaints Ob/Gyn: vaginal bleeding intermittently, spotting after intercourse in past  Musculoskeletal: no complaints Hematology: no complaints Neurologic/Psych: mood swings   Objective:  Physical Examination:  BP (!) 118/91   Pulse 96   Temp 97.8 F (36.6 C)   Resp 20   Wt 149 lb 9.6 oz (67.9 kg)   SpO2 100%   BMI 30.22 kg/m   ECOG Performance Status: 1 -  Symptomatic but completely ambulatory  GENERAL: Patient is a well appearing female in no acute distress HEENT:  PERRL, neck supple with midline trachea.  NODES:  No cervical, supraclavicular, axillary, or inguinal lymphadenopathy palpated.  LUNGS:  Clear to auscultation bilaterally.  No wheezes or rhonchi. HEART:  Regular rate and rhythm. No murmur appreciated. ABDOMEN:  Soft, nontender. Rounded.  MSK:  No focal spinal tenderness to palpation. Full range of motion bilaterally in the upper extremities. EXTREMITIES:  No peripheral edema.   SKIN:  Clear with no obvious rashes or skin changes. NEURO:  Nonfocal. Well oriented.  Appropriate affect. Highly anxious  Pelvic: exam chaperoned by nurse;  Vulva: normal appearing vulva with no masses, tenderness or lesions; Vagina: normal appearing, shortened. Cervix: flush, atrophic. os difficult to visualize, deviated posteriorly. Adnexa: no masses; Uterus: uterus is not grossly enlarged and non-tender and deviated a bit. Rectal: confirmatory, no masses or nodularity    Assessment:  Kendra Herring is a 60 y.o. 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 Treated with chemoradiation and she received weekly cisplatin and external radiation followed by brachytherapy with complete response.   Surveillance CT scan negative 8/20. Vaginal spotting and bleeding intermittently. Pap today.   Medical co-morbidities complicating care: history of cardiac arrythmia, s/p hip surgery with mobility issues, and arthritis.   Plan:   Problem List Items Addressed This Visit      Genitourinary   Squamous cell carcinoma of cervix ([place]) - Primary   Relevant Orders   IGP, Aptima HPV     Pap today. NED. Suspect bleeding secondary to atrophic vaginal tissue and post brachytherapy. If symptoms persist recommend CT to evaluate further.   Continue surveillance with Dr. Christel Mormon as scheduled with  pelvic exam in 4 months (around July 2022) then we will plan to see her back in 8 months (around November 2022).   Again encouraged regular health maintenance and cancer screenings including colonoscopy and mammograms.   Discuss mood swings and hot flashes with her pcp. Continue counseling for grief.   Beckey Rutter, DNP, AGNP-C [place] [facility] [phone] (clinic)  I personally interviewed and examined the patient. Agreed with the above/below plan of care. I  have directly contributed to assessment and plan of care of this patient and educated and discussed with patient and family.  Mellody Drown, MD  CC:  Gae Dry, MD

## 2021-01-08 LAB — IGP, APTIMA HPV: HPV Aptima: NEGATIVE

## 2021-01-19 ENCOUNTER — Telehealth: Payer: Self-pay | Admitting: Nurse Practitioner

## 2021-01-19 DIAGNOSIS — C539 Malignant neoplasm of cervix uteri, unspecified: Secondary | ICD-10-CM

## 2021-01-19 NOTE — Telephone Encounter (Signed)
Reviewed patient's pap result with Dr. Fransisca Connors. ASCCP guidelines reviewed. Given that she is symptomatic, he recommends colposcopy. Called patient to schedule. No answer. VM not available. Will send schedule message to get patient scheduled.

## 2021-01-24 ENCOUNTER — Telehealth: Payer: Self-pay | Admitting: Nurse Practitioner

## 2021-01-24 NOTE — Telephone Encounter (Signed)
Called patient to follow up regarding abnormal pap smear and recommendation for colposcopy. No answer. Unable to leave vm.

## 2021-02-09 ENCOUNTER — Inpatient Hospital Stay: Payer: Medicare Other | Attending: Obstetrics and Gynecology | Admitting: Obstetrics and Gynecology

## 2021-02-09 VITALS — BP 130/68 | HR 88 | Temp 98.6°F | Resp 20 | Wt 150.8 lb

## 2021-02-09 DIAGNOSIS — F419 Anxiety disorder, unspecified: Secondary | ICD-10-CM | POA: Diagnosis not present

## 2021-02-09 DIAGNOSIS — Z79899 Other long term (current) drug therapy: Secondary | ICD-10-CM | POA: Diagnosis not present

## 2021-02-09 DIAGNOSIS — Z791 Long term (current) use of non-steroidal anti-inflammatories (NSAID): Secondary | ICD-10-CM | POA: Insufficient documentation

## 2021-02-09 DIAGNOSIS — Z7984 Long term (current) use of oral hypoglycemic drugs: Secondary | ICD-10-CM | POA: Insufficient documentation

## 2021-02-09 DIAGNOSIS — Z923 Personal history of irradiation: Secondary | ICD-10-CM | POA: Diagnosis not present

## 2021-02-09 DIAGNOSIS — R87610 Atypical squamous cells of undetermined significance on cytologic smear of cervix (ASC-US): Secondary | ICD-10-CM | POA: Diagnosis not present

## 2021-02-09 DIAGNOSIS — C539 Malignant neoplasm of cervix uteri, unspecified: Secondary | ICD-10-CM | POA: Diagnosis present

## 2021-02-09 DIAGNOSIS — E119 Type 2 diabetes mellitus without complications: Secondary | ICD-10-CM | POA: Diagnosis not present

## 2021-02-09 NOTE — Progress Notes (Signed)
Gynecologic Oncology Interval Visit   Referring Provider: Gae Dry, MD   Chief Concern: cervical cancer  Subjective:  Kendra Herring is a 60 y.o. female, initially seen in consultation from Dr. Kenton Kingfisher for invasive cervical cancer, returns to clinic for colposcopy.   She had a surveillance visit on 01/05/2021 and Pap ASCUS and HRHPV negative. Dr. Fransisca Connors recommended colposcopy b/c she had bleeding. This was thought to be secondary to atrophic vaginal tissue and post brachytherapy. If symptoms persist he recommended CT to evaluate further.    Oncology history Kendra Herring is a pleasant postmenopausal [phone] who presented to Dr. Kenton Kingfisher evaluation and management of abnormal cervical cytology, AGUS, favor high risk cells.  Pt also had abnormal exam appearance of cervix on exam.    Colposcopy performed with 4% acetic acid as well as EMBx. Exam notable for the following: Aceto-white Lesions Location(s): throughout, abnormal appearance, contact bleeding  Pathology- 03/28/2017:         Part A: CERVIX, 6:00, BIOPSY: INVASIVE SQUAMOUS CELL CARCINOMA.  Part B: CERVIX, 10:00, BIOPSY: HIGH-GRADE SQUAMOUS INTRAEPITHELIAL LESION (CIN 3). CHRONIC CERVICITIS WITH SQUAMOUS METAPLASIA.  Part C: CERVIX, 12:00, BIOPSY: FOCAL HIGH-GRADE SQUAMOUS INTRAEPITHELIAL LESION (CIN 3). CHRONIC CERVICITIS.  Part D: ENDOCERVIX, CURETTINGS: INVASIVE SQUAMOUS CELL CARCINOMA. FRAGMENTED BIOPSY SPECIMEN.   ENDOMETRIUM, BIOPSY:  SCANT FRAGMENTS OF INACTIVE ENDOMETRIUM, MIXED WITH MINUTE FRAGMENTS OF  BENIGN ENDOCERVICAL GLANDS, MUCUS, AND BLOOD. NO HYPERPLASIA OR  CARCINOMA.   She was seen in the ER 03/28/2017 for back pain. Workup included   CT scan - dedicated renal protocol: 1. Negative for nephrolithiasis, hydronephrosis or ureteral stone. 2. There are no acute abnormalities visualized.  Dr Theora Gianotti examined patient and felt it was difficult to feel the tumor and get a sense of its size because it is inside the  canal.  And she was concerned about her back pain possibly being due to metastatic disease.   PET/CT 04/16/17 IMPRESSION:  I reviewed images with the radiologist on the phone. 1. Small focus of abnormal accentuated activity along the posterior cervix, potentially a site of focal neoplastic disease/malignancy. No pathologic adenopathy or evidence of metastatic spread. 2. Right total hip prosthesis noted with cerclage wires along the stem component. 3. Lumbar spondylosis and degenerative disc disease with potential impingement at L3-4 and L4-5.   MRI IMPRESSION: Image degradation by artifact from right hip prosthesis noted.  Subtle ill-defined 1.9 cm mass in the posterior wall of the cervix, consistent with known cervical carcinoma.  No evidence of extra-cervical tumor extension or pelvic lymphadenopathy.  She was seen at [facility] for consultation and stage was felt to be IIA due to extension of the tumor into posterior vagina.  Decision was made to treat her with chemoradiation and see received weekly cisplatin (05/09/2017-06/06/2017; 5 cycles) and external radiation (05/08/2017-06/21/2017) followed by brachytherapy (07/04/2017-07/24/2017) with complete response.  CT scan 8/20.   IMPRESSION: 1. No evidence of metastatic cervical carcinoma. 2. Stable small enhancing lesion RIGHT hepatic lobe is favored benign. 3. Small external iliac lymph nodes are stable and not pathologic by size criteria.  Problem List: Patient Active Problem List   Diagnosis Date Noted  . ASCUS of cervix with negative high risk HPV 02/09/2021  . Exertional shortness of breath 12/25/2019  . Family history of coronary artery disease 12/25/2019  . Strain of rotator cuff capsule 11/12/2017  . Anxiety disorder 04/30/2017  . Squamous cell carcinoma of cervix ([place]) 04/30/2017  . Cervical carcinoma ([place]) 04/06/2017  . Hip pain 05/02/2016  .  History of total hip arthroplasty 05/02/2016    Past Medical History: Past Medical  History:  Diagnosis Date  . Anxiety   . Arrhythmia   . Arthritis   . Asthma   . Cervical cancer ([place]) 03/2017   Rad, chemo and brachytherapy tx's.   . Diabetes mellitus without complication ([place])   . Hernia of abdominal wall 04/17/2017  . Personal history of chemotherapy   . Personal history of radiation therapy    cervical cancer    Past Surgical History: Past Surgical History:  Procedure Laterality Date  . HIP SURGERY    . TUBAL LIGATION      Past Gynecologic History:  Menarche: 15 Menstrual details: postmenopausal Menses regular: no Last Menstrual Period: a year ago History of Abnormal pap: yes as per HPI Last pap: see HPI Contraception: bilateral tubal ligation   OB History:  OB History  Gravida Para Term Preterm AB Living  3 3 3     3   SAB IAB Ectopic Multiple Live Births               # Outcome Date GA Lbr Len/2nd Weight Sex Delivery Anes PTL Lv  3 Term 08/05/89          2 Term 09/09/79          1 Term 10/16/69            Family History: Family History  Problem Relation Age of Onset  . Diabetes Mother   . Breast cancer Neg Hx     Social History: Social History   Socioeconomic History  . Marital status: Divorced    Spouse name: Not on file  . Number of children: Not on file  . Years of education: Not on file  . Highest education level: Not on file  Occupational History  . Not on file  Tobacco Use  . Smoking status: Never Smoker  . Smokeless tobacco: Never Used  Vaping Use  . Vaping Use: Never used  Substance and Sexual Activity  . Alcohol use: No  . Drug use: No  . Sexual activity: Not Currently    Birth control/protection: None  Other Topics Concern  . Not on file  Social History Narrative  . Not on file   Social Determinants of Health   Financial Resource Strain: Not on file  Food Insecurity: Not on file  Transportation Needs: Not on file  Physical Activity: Not on file  Stress: Not on file  Social Connections: Not on file   Intimate Partner Violence: Not on file    Allergies: Allergies  Allergen Reactions  . Pseudoephedrine Palpitations    Current Medications: Current Outpatient Medications  Medication Sig Dispense Refill  . Accu-Chek FastClix Lancets MISC USE TID UTD    . ACCU-CHEK GUIDE test strip USE TID UTD    . Albuterol Sulfate 108 (90 Base) MCG/ACT AEPB Inhale 2 puffs into the lungs every 4 (four) hours as needed.     . B-D UF III MINI PEN NEEDLES 31G X 5 MM MISC Inject into the skin daily.    . benzonatate (TESSALON) 100 MG capsule Take 100 mg by mouth 3 (three) times daily as needed.    . Cholecalciferol 1.25 MG (50000 UT) capsule Take 1 capsule by mouth once a week.    Marland Kitchen FLOVENT HFA 220 MCG/ACT inhaler Inhale 2 puffs into the lungs 2 (two) times daily.    Marland Kitchen JARDIANCE 10 MG TABS tablet Take 10 mg by mouth daily.    Marland Kitchen  montelukast (SINGULAIR) 10 MG tablet Take 10 mg by mouth daily.    Marland Kitchen venlafaxine XR (EFFEXOR-XR) 37.5 MG 24 hr capsule TAKE 1 CAPSULE BY MOUTH EVERY DAY FOR 1 WEEK. INCREASE TO 2 BY MOUTH EVERY DAY    . VICTOZA 18 MG/3ML SOPN Inject into the skin.    Marland Kitchen escitalopram (LEXAPRO) 10 MG tablet Take 10 mg by mouth daily. (Patient not taking: Reported on 02/09/2021)    . meloxicam (MOBIC) 15 MG tablet Take 15 mg by mouth daily. (Patient not taking: Reported on 02/09/2021)    . Olopatadine HCl 0.2 % SOLN Apply 1 drop to eye daily. (Patient not taking: Reported on 02/09/2021)    . sertraline (ZOLOFT) 25 MG tablet Take 25 mg by mouth daily. (Patient not taking: Reported on 02/09/2021)    . traZODone (DESYREL) 50 MG tablet Take 50 mg by mouth at bedtime as needed.  (Patient not taking: Reported on 02/09/2021)     No current facility-administered medications for this visit.   Review of Systems General: no complaints  HEENT: no complaints  Lungs: no complaints  Cardiac: no complaints  GI: no complaints  GU: no complaints  Musculoskeletal: no complaints  Extremities: no complaints  Skin: no  complaints  Neuro: no complaints  Endocrine: no complaints  Psych: no complaints        Objective:  Physical Examination:  BP 130/68   Pulse 88   Temp 98.6 F (37 C) (Tympanic)   Resp 20   Wt 150 lb 12.8 oz (68.4 kg)   SpO2 96%   BMI 30.46 kg/m   GENERAL: Patient is a well appearing female in no acute distress   Pelvic: EGBUS: no lesions Cervix: no lesions, but unable to visualize completely. The cervix is agglutinated to the vagina and only a small amount of the anterior lip is appreciated. The os is not readily visible. Vagina: no lesions, no discharge or bleeding Remainder of the exam is deferred.   VAGINAL/CERVICAL COLPOSCOPY WITH BIOPSY PROCEDURE: The risks and benefits of the procedure were reviewed and informed consent obtained. Time out was performed. The patient received pre-procedure teaching and expressed understanding. The post-procedure instructions were reviewed with the patient and she expressed understanding. The patient does not have any barriers to learning.  Colposcopy of the upper vagina and cervix was performed after application os acetic acid. The findings revealed diffuse atrophy with radiation changes and a very small area of AWE on the posterior vagina at 7 o'clock. The transformation zone was not seen - inadequate colpo. Biopsy performed at above site. Procedure completed without complications. Hemostasis adequate with AgNO3. The patient tolerated the procedure well.   Post-procedure evaluation the patient was stable without complaints.     Assessment:  RHETT NAJERA is a 60 y.o. 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 Treated with chemoradiation and she received weekly cisplatin and external radiation followed by brachytherapy with complete response.  01/05/2021 ASCUS Pap/HRHPV negative; abnormal AWE vagina.   Surveillance CT scan negative 8/20. Vaginal spotting and  bleeding intermittently. Pap today.   Medical co-morbidities complicating care: history of cardiac arrythmia, s/p hip surgery with mobility issues, and arthritis.   Plan:   Problem List Items Addressed This Visit      Genitourinary   Squamous cell carcinoma of cervix ([place])   Relevant Orders   Surgical pathology     Other   ASCUS of cervix with negative  high risk HPV - Primary     Vaginal biopsy obtained - will follow up. If negative continue surveillance with Dr. Christel Mormon as scheduled with pelvic exam in 4 months (around July 2022) then we will plan to see her back in 8 months (around November 2022).   Previously encouraged regular health maintenance and cancer screenings including colonoscopy and mammograms; and mood swings and hot flashes with her pcp. Continue counseling for grief.   Beckey Rutter, DNP, AGNP-C [place] [facility] [phone] (clinic)  I personally had a face to face interaction and evaluated the patient jointly with the NP, Ms. Beckey Rutter.  I have reviewed her history and available records and have performed the key portions of the physical exam including pelvic exam with my findings confirming those documented above by the APP.  I have discussed the case with the APP and the patient.  I agree with the above documentation, assessment and plan which was fully formulated by me.  Counseling was completed by me.   I personally saw the patient and performed a substantive portion of this encounter in conjunction with the listed APP as documented above. I performed the procedure.   Izrael Peak Gaetana Michaelis, MD

## 2021-02-11 LAB — SURGICAL PATHOLOGY

## 2021-02-15 ENCOUNTER — Telehealth: Payer: Self-pay | Admitting: *Deleted

## 2021-02-15 NOTE — Telephone Encounter (Signed)
Patient called asking if path results were back yet. I looked at report and told her that it is back and she asked if it was good. I told her it said negative for malignancy. She then asked so is everything good? I told her I could not speak to the, but would send a message to Alease Medina, NP to see if she will call patient. She thanked me. Her next appointment is not until November.   Notes  Component 6 d ago  SURGICAL PATHOLOGY SURGICAL PATHOLOGY  CASE: [id number]  PATIENT: Kendra Herring  Surgical Pathology Report      Specimen Submitted:  A. Vagina   Clinical History: History of stage IIA invasive squamous cell cancer of  the cervix (2018) with negative CT/PET scan for metastases. Treated with  chemoradiation with complete response. 01/05/2021 ASCUS Pap/HRHPV  negative; abnormal AWE vagina on 02/09/2021 exam.       DIAGNOSIS:  A. VAGINA; BIOPSY:  - SCLEROTIC FIBROUS TISSUE, INTACT EPITHELIUM NOT IDENTIFIED.  - DEEPER SECTIONS EXAMINED.  - NEGATIVE FOR MALIGNANCY.   GROSS DESCRIPTION:  A. Labeled: Received labeled with the patient's name and date of birth  (per Beckey Rutter, NP "vagina", 02/11/2021)  Received: Formalin  Collection time: 4 PM on 02/09/2021  Placed into formalin time: 4 PM on 02/09/2021  Tissue fragment(s): 1  Size: 0.4 x 0.2 x 0.1 cm  Description: White soft tissue fragment  Entirely submitted in 1 cassette.   RB 02/10/2021   Final Diagnosis performed by Quay Burow, MD.  Electronically signed  02/11/2021 2:59:35PM  The electronic signature indicates that the named Attending Pathologist  has evaluated the specimen  Technical component performed at [facility], [address], [place],  [place] [address] Lab: [phone] Dir: Rush Farmer, MD, MMM  Professional component performed at [facility], [facility], [place], [place], [place] [address] Lab: [phone]  Dir: Dellia Nims. Reuel Derby, MD   Resulting Agency [facility] PATH LAB          Specimen Collected: 02/09/21 16:00 Last Resulted: 02/11/21 15:16

## 2021-02-16 ENCOUNTER — Ambulatory Visit: Payer: Medicare Other

## 2021-02-17 NOTE — Telephone Encounter (Signed)
Call returned to patient and informed of L Zenia Resides, NP response, She thanked me fr letting her know and confirmed next appointment date for Nov 16/2022 @ 1130

## 2021-02-17 NOTE — Telephone Encounter (Signed)
Please let patient know there is no evidence of recurrent cancer. Follow up as scheduled.

## 2021-09-07 ENCOUNTER — Inpatient Hospital Stay: Payer: Medicare Other | Attending: Obstetrics and Gynecology | Admitting: Nurse Practitioner

## 2021-09-07 ENCOUNTER — Other Ambulatory Visit: Payer: Self-pay

## 2021-09-07 VITALS — BP 138/76 | HR 84 | Temp 97.8°F | Resp 20 | Wt 147.3 lb

## 2021-09-07 DIAGNOSIS — Z8541 Personal history of malignant neoplasm of cervix uteri: Secondary | ICD-10-CM | POA: Diagnosis not present

## 2021-09-07 DIAGNOSIS — Z08 Encounter for follow-up examination after completed treatment for malignant neoplasm: Secondary | ICD-10-CM | POA: Diagnosis not present

## 2021-09-07 DIAGNOSIS — C539 Malignant neoplasm of cervix uteri, unspecified: Secondary | ICD-10-CM | POA: Diagnosis not present

## 2021-09-07 NOTE — Progress Notes (Signed)
Gynecologic Oncology Interval Visit   Referring Provider: Gae Dry, MD   Chief Concern: cervical cancer  Subjective:  Kendra Herring is a 60 y.o. female, initially seen in consultation from Dr. Kenton Kingfisher for invasive cervical cancer, returns to clinic for surveillance.   She saw Dr. Theora Gianotti 02/09/21 with colposcopy. Biopsy obtained:  DIAGNOSIS:  A.  VAGINA; BIOPSY:  - SCLEROTIC FIBROUS TISSUE, INTACT EPITHELIUM NOT IDENTIFIED.  - DEEPER SECTIONS EXAMINED.  - NEGATIVE FOR MALIGNANCY.  No additional bleeding, pain, or changes in bowel movements.    Oncology history Kendra Herring is a pleasant postmenopausal [phone] who presented to Dr. Kenton Kingfisher evaluation and management of abnormal cervical cytology, AGUS, favor high risk cells.  Pt also had abnormal exam appearance of cervix on exam.    Colposcopy performed with 4% acetic acid as well as EMBx. Exam notable for the following: Aceto-white Lesions Location(s): throughout, abnormal appearance, contact bleeding  Pathology- 03/28/2017:         Part A: CERVIX, 6:00, BIOPSY: INVASIVE SQUAMOUS CELL CARCINOMA.  Part B: CERVIX, 10:00, BIOPSY: HIGH-GRADE SQUAMOUS INTRAEPITHELIAL LESION (CIN 3). CHRONIC CERVICITIS WITH SQUAMOUS METAPLASIA.  Part C: CERVIX, 12:00, BIOPSY: FOCAL HIGH-GRADE SQUAMOUS INTRAEPITHELIAL LESION (CIN 3). CHRONIC CERVICITIS.  Part D: ENDOCERVIX, CURETTINGS: INVASIVE SQUAMOUS CELL CARCINOMA. FRAGMENTED BIOPSY SPECIMEN.   ENDOMETRIUM, BIOPSY:  SCANT FRAGMENTS OF INACTIVE ENDOMETRIUM, MIXED WITH MINUTE FRAGMENTS OF  BENIGN ENDOCERVICAL GLANDS,  MUCUS, AND BLOOD. NO HYPERPLASIA OR  CARCINOMA.   She was seen in the ER 03/28/2017 for back pain. Workup included   CT scan - dedicated renal protocol: 1. Negative for nephrolithiasis, hydronephrosis or ureteral stone. 2. There are no acute abnormalities visualized.  Dr Theora Gianotti examined patient and felt it was difficult to feel the tumor and get a sense of its size because it is  inside the canal.  And she was concerned about her back pain possibly being due to metastatic disease.   PET/CT 04/16/17 IMPRESSION:  I reviewed images with the radiologist on the phone. 1. Small focus of abnormal accentuated activity along the posterior cervix, potentially a site of focal neoplastic disease/malignancy. No pathologic adenopathy or evidence of metastatic spread. 2. Right total hip prosthesis noted with cerclage wires along the stem component. 3. Lumbar spondylosis and degenerative disc disease with potential impingement at L3-4 and L4-5.   MRI IMPRESSION: Image degradation by artifact from right hip prosthesis noted.  Subtle ill-defined 1.9 cm mass in the posterior wall of the cervix, consistent with known cervical carcinoma.  No evidence of extra-cervical tumor extension or pelvic lymphadenopathy.  She was seen at [facility] for consultation and stage was felt to be IIA due to extension of the tumor into posterior vagina.  Decision was made to treat her with chemoradiation and see received weekly cisplatin (05/09/2017-06/06/2017; 5 cycles) and external radiation (05/08/2017-06/21/2017) followed by brachytherapy (07/04/2017-07/24/2017) with complete response.  CT scan 8/20.   IMPRESSION: 1. No evidence of metastatic cervical carcinoma. 2. Stable small enhancing lesion RIGHT hepatic lobe is favored benign. 3. Small external iliac lymph nodes are stable and not pathologic by size criteria.  She had a surveillance visit on 01/05/2021 and Pap ASCUS and HRHPV negative. Dr. Fransisca Connors recommended colposcopy b/c she had bleeding. This was thought to be secondary to atrophic vaginal tissue and post brachytherapy. If symptoms persist he recommended CT to evaluate further.   Problem List: Patient Active Problem List   Diagnosis Date Noted   ASCUS of cervix with negative high risk HPV 02/09/2021  Exertional shortness of breath 12/25/2019   Family history of coronary artery disease 12/25/2019    Strain of rotator cuff capsule 11/12/2017   Anxiety disorder 04/30/2017   Squamous cell carcinoma of cervix ([place]) 04/30/2017   Cervical carcinoma ([place]) 04/06/2017   Hip pain 05/02/2016   History of total hip arthroplasty 05/02/2016    Past Medical History: Past Medical History:  Diagnosis Date   Anxiety    Arrhythmia    Arthritis    Asthma    Cervical cancer ([place]) 03/2017   Rad, chemo and brachytherapy tx's.    Diabetes mellitus without complication ([place])    Hernia of abdominal wall 04/17/2017   Personal history of chemotherapy    Personal history of radiation therapy    cervical cancer    Past Surgical History: Past Surgical History:  Procedure Laterality Date   HIP SURGERY     TUBAL LIGATION      Past Gynecologic History:  Menarche: 15 Menstrual details: postmenopausal Menses regular: no Last Menstrual Period: a year ago History of Abnormal pap: yes as per HPI Last pap: see HPI Contraception: bilateral tubal ligation   OB History:  OB History  Gravida Para Term Preterm AB Living  3 3 3     3   SAB IAB Ectopic Multiple Live Births               # Outcome Date GA Lbr Len/2nd Weight Sex Delivery Anes PTL Lv  3 Term 08/05/89          2 Term 09/09/79          1 Term 10/16/69            Family History: Family History  Problem Relation Age of Onset   Diabetes Mother    Breast cancer Neg Hx     Social History: Social History   Socioeconomic History   Marital status: Divorced    Spouse name: Not on file   Number of children: Not on file   Years of education: Not on file   Highest education level: Not on file  Occupational History   Not on file  Tobacco Use   Smoking status: Never   Smokeless tobacco: Never  Vaping Use   Vaping Use: Never used  Substance and Sexual Activity   Alcohol use: No   Drug use: No   Sexual activity: Not Currently    Birth control/protection: None  Other Topics Concern   Not on file  Social History Narrative   Not on  file   Social Determinants of Health   Financial Resource Strain: Not on file  Food Insecurity: Not on file  Transportation Needs: Not on file  Physical Activity: Not on file  Stress: Not on file  Social Connections: Not on file  Intimate Partner Violence: Not on file    Allergies: Allergies  Allergen Reactions   Pseudoephedrine Palpitations    Current Medications: Current Outpatient Medications  Medication Sig Dispense Refill   Accu-Chek FastClix Lancets MISC USE TID UTD     ACCU-CHEK GUIDE test strip USE TID UTD     Albuterol Sulfate 108 (90 Base) MCG/ACT AEPB Inhale 2 puffs into the lungs every 4 (four) hours as needed.      B-D UF III MINI PEN NEEDLES 31G X 5 MM MISC Inject into the skin daily.     benzonatate (TESSALON) 100 MG capsule Take 100 mg by mouth 3 (three) times daily as needed.     Cholecalciferol 1.25  MG (50000 UT) capsule Take 1 capsule by mouth once a week.     escitalopram (LEXAPRO) 10 MG tablet Take 10 mg by mouth daily. (Patient not taking: Reported on 02/09/2021)     FLOVENT HFA 220 MCG/ACT inhaler Inhale 2 puffs into the lungs 2 (two) times daily.     JARDIANCE 10 MG TABS tablet Take 10 mg by mouth daily.     meloxicam (MOBIC) 15 MG tablet Take 15 mg by mouth daily. (Patient not taking: Reported on 02/09/2021)     montelukast (SINGULAIR) 10 MG tablet Take 10 mg by mouth daily.     Olopatadine HCl 0.2 % SOLN Apply 1 drop to eye daily. (Patient not taking: Reported on 02/09/2021)     sertraline (ZOLOFT) 25 MG tablet Take 25 mg by mouth daily. (Patient not taking: Reported on 02/09/2021)     traZODone (DESYREL) 50 MG tablet Take 50 mg by mouth at bedtime as needed.  (Patient not taking: Reported on 02/09/2021)     venlafaxine XR (EFFEXOR-XR) 37.5 MG 24 hr capsule TAKE 1 CAPSULE BY MOUTH EVERY DAY FOR 1 WEEK. INCREASE TO 2 BY MOUTH EVERY DAY     VICTOZA 18 MG/3ML SOPN Inject into the skin.     No current facility-administered medications for this visit.   Review  of Systems General:  no complaints Skin: no complaints Eyes: no complaints HEENT: no complaints Breasts: no complaints Pulmonary: no complaints Cardiac: no complaints Gastrointestinal: no complaints Genitourinary/Sexual: no complaints Ob/Gyn: no complaints Musculoskeletal: no complaints Hematology: no complaints Neurologic/Psych: no complaints  Objective:  Physical Examination:  BP 138/76   Pulse 84   Temp 97.8 F (36.6 C)   Resp 20   Wt 147 lb 4.8 oz (66.8 kg)   SpO2 100%   BMI 29.75 kg/m   GENERAL: Patient is a well appearing female in no acute distress NODES:  Negative axillary, supraclavicular, inguinal lymph node survery LUNGS:  Clear to auscultation bilaterally.   HEART:  Regular rate and rhythm.  ABDOMEN:  Soft, nontender. No masses or ascites EXTREMITIES:  No peripheral edema. Atraumatic. No cyanosis SKIN:  Clear with no obvious rashes or skin changes.  NEURO:  Nonfocal. Well oriented.  Appropriate affect.  Pelvic: Exam chaperoned by CMA EGBUS: no lesions or discharge. Vagina: no lesions, discharge or bleeding. Cervix: agglutinated, atrophic. BME: non-tender, smooth. No palpable nodularity. Rectal: deferred     Assessment:  Kendra Herring is a 61 y.o. 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 Treated with chemoradiation and she received weekly cisplatin and external radiation followed by brachytherapy with complete response.  01/05/2021 ASCUS Pap/HRHPV negative; abnormal AWE vagina.   Surveillance CT scan negative 8/20. Vaginal spotting and bleeding intermittently. ASCUS Pap s/p colpo with negative biopsy. NED on exam today.   Medical co-morbidities complicating care:  history of cardiac arrythmia, s/p hip surgery with mobility issues, and arthritis .   Plan:   Problem List Items Addressed This Visit   None Visit Diagnoses     Encounter for follow-up surveillance of cervical  cancer    -  Primary      She was released from follow up from Dr. Christel Mormon therefore we will see her back in 6 months. I offered referral for pelvic floor PT. Can also use vaginal dialators and vaginal moisturizers to see if they improve her symptoms. She will consider.   Previously encouraged regular health maintenance and cancer  screenings including colonoscopy and mammograms; and mood swings and hot flashes with her pcp. Continue counseling for grief.   Beckey Rutter, DNP, AGNP-C [place] at [facility] [phone] (clinic)

## 2021-10-23 HISTORY — PX: CHOLECYSTECTOMY: SHX55

## 2021-11-01 ENCOUNTER — Other Ambulatory Visit: Payer: Self-pay | Admitting: Family Medicine

## 2021-12-26 ENCOUNTER — Emergency Department: Payer: Medicare Other

## 2021-12-26 ENCOUNTER — Other Ambulatory Visit: Payer: Self-pay

## 2021-12-26 DIAGNOSIS — R52 Pain, unspecified: Secondary | ICD-10-CM

## 2021-12-26 DIAGNOSIS — E119 Type 2 diabetes mellitus without complications: Secondary | ICD-10-CM | POA: Diagnosis not present

## 2021-12-26 DIAGNOSIS — Z8541 Personal history of malignant neoplasm of cervix uteri: Secondary | ICD-10-CM | POA: Insufficient documentation

## 2021-12-26 DIAGNOSIS — Z96641 Presence of right artificial hip joint: Secondary | ICD-10-CM | POA: Insufficient documentation

## 2021-12-26 DIAGNOSIS — J45909 Unspecified asthma, uncomplicated: Secondary | ICD-10-CM | POA: Insufficient documentation

## 2021-12-26 DIAGNOSIS — E860 Dehydration: Secondary | ICD-10-CM | POA: Diagnosis not present

## 2021-12-26 DIAGNOSIS — K5900 Constipation, unspecified: Secondary | ICD-10-CM | POA: Insufficient documentation

## 2021-12-26 DIAGNOSIS — R1011 Right upper quadrant pain: Secondary | ICD-10-CM | POA: Diagnosis present

## 2021-12-26 LAB — CBC
HCT: 49.2 % — ABNORMAL HIGH (ref 36.0–46.0)
Hemoglobin: 16.3 g/dL — ABNORMAL HIGH (ref 12.0–15.0)
MCH: 30 pg (ref 26.0–34.0)
MCHC: 33.1 g/dL (ref 30.0–36.0)
MCV: 90.4 fL (ref 80.0–100.0)
Platelets: 266 10*3/uL (ref 150–400)
RBC: 5.44 MIL/uL — ABNORMAL HIGH (ref 3.87–5.11)
RDW: 12.4 % (ref 11.5–15.5)
WBC: 9.5 10*3/uL (ref 4.0–10.5)
nRBC: 0 % (ref 0.0–0.2)

## 2021-12-26 LAB — COMPREHENSIVE METABOLIC PANEL
ALT: 48 U/L — ABNORMAL HIGH (ref 0–44)
AST: 29 U/L (ref 15–41)
Albumin: 4.3 g/dL (ref 3.5–5.0)
Alkaline Phosphatase: 82 U/L (ref 38–126)
Anion gap: 12 (ref 5–15)
BUN: 15 mg/dL (ref 6–20)
CO2: 24 mmol/L (ref 22–32)
Calcium: 9.8 mg/dL (ref 8.9–10.3)
Chloride: 104 mmol/L (ref 98–111)
Creatinine, Ser: 0.62 mg/dL (ref 0.44–1.00)
GFR, Estimated: >60 mL/min (ref >60)
Glucose, Bld: 214 mg/dL — ABNORMAL HIGH (ref 70–99)
Potassium: 3.8 mmol/L (ref 3.5–5.1)
Sodium: 140 mmol/L (ref 135–145)
Total Bilirubin: 0.8 mg/dL (ref 0.3–1.2)
Total Protein: 8.3 g/dL — ABNORMAL HIGH (ref 6.5–8.1)

## 2021-12-26 LAB — LIPASE, BLOOD: Lipase: 29 U/L (ref 11–51)

## 2021-12-26 LAB — TROPONIN I (HIGH SENSITIVITY): Troponin I (High Sensitivity): 4 ng/L (ref <18)

## 2021-12-26 NOTE — ED Triage Notes (Signed)
Pt presents via POV c/o RUQ abd pain extending into the back. Denies N/V/D. Reports pain as constant pressure. Reports pain extends into the back. ?

## 2021-12-27 ENCOUNTER — Emergency Department: Payer: Medicare Other

## 2021-12-27 ENCOUNTER — Emergency Department
Admission: EM | Admit: 2021-12-27 | Discharge: 2021-12-27 | Disposition: A | Discharge status: Home / Self Care | Payer: Medicare Other | Attending: Emergency Medicine | Admitting: Emergency Medicine

## 2021-12-27 DIAGNOSIS — K5900 Constipation, unspecified: Secondary | ICD-10-CM | POA: Diagnosis not present

## 2021-12-27 DIAGNOSIS — R1010 Upper abdominal pain, unspecified: Secondary | ICD-10-CM

## 2021-12-27 DIAGNOSIS — E860 Dehydration: Secondary | ICD-10-CM

## 2021-12-27 DIAGNOSIS — R52 Pain, unspecified: Secondary | ICD-10-CM

## 2021-12-27 LAB — URINALYSIS, ROUTINE W REFLEX MICROSCOPIC
Bacteria, UA: NONE SEEN
Bilirubin Urine: NEGATIVE
Glucose, UA: >=500 mg/dL — AB
Hgb urine dipstick: NEGATIVE
Ketones, ur: 5 mg/dL — AB
Leukocytes,Ua: NEGATIVE
Nitrite: NEGATIVE
Protein, ur: NEGATIVE mg/dL
Specific Gravity, Urine: 1.032 — ABNORMAL HIGH (ref 1.005–1.030)
Squamous Epithelial / HPF: NONE SEEN (ref 0–5)
pH: 5 (ref 5.0–8.0)

## 2021-12-27 LAB — TROPONIN I (HIGH SENSITIVITY): Troponin I (High Sensitivity): 4 ng/L (ref <18)

## 2021-12-27 MED ORDER — ONDANSETRON HCL 4 MG/2ML IJ SOLN
4.0000 mg | Freq: Once | INTRAMUSCULAR | Status: AC
  Administered 2021-12-27: 4 mg via INTRAVENOUS
  Filled 2021-12-27: qty 2

## 2021-12-27 MED ORDER — SODIUM CHLORIDE 0.9 % IV BOLUS
500.0000 mL | Freq: Once | INTRAVENOUS | Status: AC
  Administered 2021-12-27: 500 mL via INTRAVENOUS

## 2021-12-27 MED ORDER — FAMOTIDINE IN NACL 20-0.9 MG/50ML-% IV SOLN
20.0000 mg | Freq: Once | INTRAVENOUS | Status: AC
  Administered 2021-12-27: 20 mg via INTRAVENOUS
  Filled 2021-12-27: qty 50

## 2021-12-27 MED ORDER — DICYCLOMINE HCL 20 MG PO TABS: 20.0000 mg | ORAL_TABLET | Freq: Four times a day (QID) | ORAL | 0 refills | Status: DC

## 2021-12-27 MED ORDER — IOHEXOL 300 MG/ML  SOLN
100.0000 mL | Freq: Once | INTRAMUSCULAR | Status: AC | PRN
  Administered 2021-12-27: 100 mL via INTRAVENOUS

## 2021-12-27 MED ORDER — ONDANSETRON 4 MG PO TBDP: 4.0000 mg | ORAL_TABLET | Freq: Three times a day (TID) | ORAL | 0 refills | Status: DC | PRN

## 2021-12-27 MED ORDER — LACTULOSE 10 GM/15ML PO SOLN: 20.0000 g | Freq: Every day | ORAL | 0 refills | Status: DC | PRN

## 2021-12-27 NOTE — ED Provider Notes (Signed)
[facility] Provider Note    Event Date/Time   First MD Initiated Contact with Patient 12/27/21 [phone]     (approximate)   History   Abdominal Pain   HPI  Kendra Herring is a 61 y.o. female who presents to the ED from home with a chief complaint of abdominal pain since 3 PM.  Patient reports right upper quadrant abdominal pain radiating into her right flank.  Did not eat lunch or dinner yesterday secondary to lack of appetite.  Reports constant pressure type pain not associated with nausea, vomiting or diarrhea.  Denies recent fever, chills, cough, chest pain or shortness of breath.     Past Medical History   Past Medical History:  Diagnosis Date   Anxiety    Arrhythmia    Arthritis    Asthma    Cervical cancer ([place]) 03/2017   Rad, chemo and brachytherapy tx's.    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 Authorizing Provider  dicyclomine (BENTYL) 20 MG tablet Take 1 tablet (20 mg total) by mouth every 6 (six) hours. 12/27/21  Yes [name], MD  lactulose (CHRONULAC) 10 GM/15ML solution Take 30 mLs (20 g total) by mouth daily  as needed for mild constipation. 12/27/21  Yes [name], MD  ondansetron (ZOFRAN-ODT) 4 MG disintegrating tablet Take 1 tablet (4 mg total) by mouth every 8 (eight) hours as needed for nausea or vomiting. 12/27/21  Yes [name], MD  Accu-Chek FastClix Lancets MISC USE TID UTD 03/30/19   [provider]  ACCU-CHEK GUIDE test strip USE TID UTD 03/05/19   [provider]  Albuterol Sulfate 108 (90 Base) MCG/ACT AEPB Inhale 2 puffs into the lungs every 4 (four) hours as needed.     [provider]  B-D UF III MINI PEN NEEDLES 31G X 5 MM MISC Inject into the skin daily. 07/24/20   [provider]  benzonatate (TESSALON) 100 MG capsule Take 100 mg by mouth 3 (three) times daily as needed. 02/01/21   [provider]  Cholecalciferol 1.25 MG (50000 UT) capsule Take 1 capsule by mouth once a week. 11/29/18   [provider]  escitalopram (LEXAPRO) 10 MG tablet Take 10 mg by mouth daily. Patient not taking: Reported on 02/09/2021 08/19/20   [provider]  FLOVENT HFA 220 MCG/ACT inhaler Inhale 2 puffs into the lungs 2 (two) times daily. 06/15/20   [provider]  JARDIANCE 10 MG TABS tablet Take 10 mg by mouth daily. 05/31/20   [provider]  meloxicam (MOBIC) 15 MG tablet Take 15 mg by mouth daily. Patient not taking: Reported on 02/09/2021    [provider]  montelukast (SINGULAIR) 10 MG tablet Take 10 mg by mouth daily. 04/06/20   [provider]  Olopatadine HCl 0.2 % SOLN Apply 1 drop to eye daily. Patient not taking: Reported on 02/09/2021 04/08/20   [provider]  sertraline (ZOLOFT) 25 MG tablet Take 25 mg by mouth daily. Patient not taking: Reported on 02/09/2021    [provider]  traZODone (DESYREL) 50 MG tablet Take 50 mg by mouth at bedtime as needed.  Patient not taking: Reported on 02/09/2021 05/22/19   [provider]  venlafaxine XR (EFFEXOR-XR) 37.5 MG 24 hr capsule  TAKE 1 CAPSULE BY MOUTH EVERY DAY FOR 1 WEEK. INCREASE TO 2 BY MOUTH EVERY DAY 02/01/21   [provider]  [place] 18 MG/3ML SOPN Inject into the skin. 08/22/20   [provider]     Allergies  Pseudoephedrine   Family History   Family History  Problem Relation Age of Onset   Diabetes Mother    Breast cancer Neg Hx      Physical Exam  Triage Vital Signs: ED Triage Vitals  Enc Vitals Group     BP 12/26/21 2235 (!) 148/88     Pulse Rate 12/26/21 2235 76     Resp 12/26/21 2235 18     Temp 12/26/21 2235 97.7 F (36.5 C)     Temp Source 12/26/21 2235 Oral     SpO2 12/26/21 2235 95 %     Weight --      Height --      Head Circumference --      Peak Flow --      Pain Score 12/26/21 2236 10     Pain Loc --      Pain Edu? --      Excl. in [place]? --     Updated Vital Signs: BP (!) 155/76 (BP Location: Left Arm)    Pulse 76    Temp 97.7 F (36.5 C) (Oral)    Resp 17    SpO2 98%    General: Awake, mild distress.  CV:  RRR.  Good peripheral perfusion.  Resp:  Normal effort.  CTA B. Abd:  Mildly tender to palpation epigastrium and right upper quadrant without rebound or guarding.  No distention.  Other:  No vesicles.   ED Results / Procedures / Treatments  Labs (all labs ordered are listed, but only abnormal results are displayed) Labs Reviewed  COMPREHENSIVE METABOLIC PANEL - Abnormal; Notable for the following components:      Result Value   Glucose, Bld 214 (*)    Total Protein 8.3 (*)    ALT 48 (*)    All other components within normal limits  CBC - Abnormal; Notable for the following components:   RBC 5.44 (*)    Hemoglobin 16.3 (*)    HCT 49.2 (*)    All other components within normal limits  URINALYSIS, ROUTINE W REFLEX MICROSCOPIC - Abnormal; Notable for the following components:   Color, Urine STRAW (*)    APPearance CLEAR (*)    Specific Gravity, Urine 1.032 (*)    Glucose, UA >=500 (*)    Ketones, ur 5 (*)    All other components  within normal limits  LIPASE, BLOOD  TROPONIN I (HIGH SENSITIVITY)  TROPONIN I (HIGH SENSITIVITY)     EKG  ED  ECG REPORT I, Daney Moor J, the attending physician, personally viewed and interpreted this ECG.   Date: 12/27/2021  EKG Time: 2247  Rate: 68  Rhythm: normal sinus rhythm  Axis: Normal  Intervals:none  ST&T Change: Nonspecific    RADIOLOGY I have independently visualized and reviewed patient's ultrasound, CT abdomen pelvis as well as noted the radiology interpretation:  Ultrasound: Negative for gallstones, diffusely echogenic liver consistent with hepatic steatosis or hepatocellular disease  CT abdomen pelvis: Unremarkable  Official radiology report(s): CT Abdomen Pelvis W Contrast  Result Date: 12/27/2021 CLINICAL DATA:  61 year old female with history of right upper quadrant abdominal pain extending into the back. EXAM: CT ABDOMEN AND PELVIS WITH CONTRAST TECHNIQUE: Multidetector CT imaging of the abdomen and pelvis was performed using the standard protocol following bolus administration of intravenous contrast. RADIATION DOSE REDUCTION: This exam was performed according to the departmental dose-optimization program which includes automated exposure control, adjustment of the mA and/or kV according to patient size and/or use of iterative reconstruction technique. CONTRAST:  144m OMNIPAQUE IOHEXOL 300 MG/ML  SOLN COMPARISON:  CT of the abdomen and pelvis 06/03/2019. FINDINGS: Lower chest: Unremarkable. Hepatobiliary: No suspicious cystic or solid hepatic lesions. The appearance of the gallbladder is normal. Pancreas: No pancreatic mass. No pancreatic ductal dilatation. No pancreatic or peripancreatic fluid collections or inflammatory changes. Spleen: Unremarkable. Adrenals/Urinary Tract: Bilateral kidneys and bilateral adrenal glands are normal in appearance. No hydroureteronephrosis. Urinary bladder is partially obscured by beam hardening artifact from the patient's right hip  arthroplasty, but visualized portions are unremarkable in appearance. Stomach/Bowel: The appearance of the stomach is normal. There is no pathologic dilatation of small bowel or colon. 3.2 x 2.7 cm diverticulum extending off the posterior aspect of the distal second portion of the duodenum, without surrounding inflammatory changes to indicate an associated duodenal diverticulitis at this time. Normal appendix. Vascular/Lymphatic: No significant atherosclerotic disease, aneurysm or dissection noted in the abdominal or pelvic vasculature. No lymphadenopathy noted in the abdomen or pelvis. Reproductive: Uterus and ovaries are unremarkable in appearance. Other: No significant volume of ascites.  No pneumoperitoneum. Musculoskeletal: Status post right hip arthroplasty. There are no aggressive appearing lytic or blastic lesions noted in the visualized portions of the skeleton. IMPRESSION: 1. No acute findings are noted in the abdomen or pelvis to account for the patient's symptoms. 2. Incidental findings, as above. Electronically Signed   By: DVinnie LangtonM.D.   On: 12/27/2021 06:06   UKoreaAbdomen Limited RUQ (LIVER/GB)  Result Date: 12/27/2021 CLINICAL DATA:  Right upper quadrant pain EXAM: ULTRASOUND ABDOMEN LIMITED RIGHT UPPER QUADRANT COMPARISON:  CT 06/03/2019 FINDINGS: Gallbladder: No gallstones or wall thickening visualized. No sonographic Murphy sign noted by sonographer. Common bile duct: Diameter: 3.2 mm Liver: Diffusely echogenic. No focal hepatic abnormality. Portal vein is patent on color Doppler imaging with normal direction of blood flow towards the liver. Other: None. IMPRESSION: 1. Negative for gallstones. 2. Diffusely echogenic liver consistent with hepatic steatosis and or hepatocellular disease Electronically Signed   By: KDonavan FoilM.D.   On: 12/27/2021 00:19     PROCEDURES:  Critical Care performed: No  Procedures   MEDICATIONS ORDERED IN ED: Medications  famotidine (PEPCID) IVPB  20 mg premix (0 mg Intravenous Stopped 12/27/21 0627)  ondansetron (ZOFRAN) injection 4 mg (4 mg Intravenous Given 12/27/21 0540)  sodium chloride 0.9 % bolus 500 mL (0 mLs Intravenous Stopped 12/27/21 0627)  iohexol (OMNIPAQUE) 300 MG/ML solution 100 mL (100 mLs Intravenous Contrast Given 12/27/21 0546)  IMPRESSION / MDM / ASSESSMENT AND PLAN / ED COURSE  I reviewed the triage vital signs and the nursing notes.                             61 year old female presenting with upper abdominal pain. Differential diagnosis includes, but is not limited to, biliary disease (biliary colic, acute cholecystitis, cholangitis, choledocholithiasis, etc), intrathoracic causes for epigastric abdominal pain including ACS, gastritis, duodenitis, pancreatitis, small bowel or large bowel obstruction, abdominal aortic aneurysm, hernia, and ulcer(s).  I have personally reviewed patient's records and see an oncology follow-up visit for cervical cancer from 09/07/2021.  Laboratory results unremarkable; WBC 9.5, LFTs/lipase, negative troponin x2, mild ketonuria.  Ultrasound unremarkable.  Will obtain CT abdomen pelvis.  Initiate IV fluid hydration, IV Pepcid and Zofran.  Will reassess.  Clinical Course as of 12/27/21 0627  Tue Dec 27, 2021  0611 CT demonstrates no acute findings.  Moderate stool burden is visualized.  Will discharge home with prescription for Lactulose, Bentyl and Zofran to use as needed.  Strict return precautions given.  Patient verbalizes understanding agrees with plan of care. [JS]    Clinical Course User Index [JS] [name], MD     FINAL CLINICAL IMPRESSION(S) / ED DIAGNOSES   Final diagnoses:  Pain  Pain of upper abdomen  Dehydration  Constipation, unspecified constipation type     Rx / DC Orders   ED Discharge Orders          Ordered    lactulose (CHRONULAC) 10 GM/15ML solution  Daily PRN        12/27/21 0613    dicyclomine (BENTYL) 20 MG tablet  Every 6 hours        12/27/21  0613    ondansetron (ZOFRAN-ODT) 4 MG disintegrating tablet  Every 8 hours PRN        12/27/21 8413             Note:  This document was prepared using Dragon voice recognition software and may include unintentional dictation errors.   [name], MD 12/27/21 [phone]

## 2021-12-27 NOTE — ED Notes (Signed)
Pt to Ct at this time.

## 2021-12-27 NOTE — Discharge Instructions (Signed)
1.  Take Lactulose as needed for bowel movements. ?2.  You may take medicines as needed for abdominal discomfort and nausea (Bentyl/Zofran #20). ?3.  Drink plenty of fluids daily. ?4.  Return to the ER for worsening symptoms, persistent vomiting, difficulty breathing or other concerns. ?

## 2021-12-30 ENCOUNTER — Inpatient Hospital Stay
Admission: EM | Admit: 2021-12-30 | Discharge: 2022-01-03 | DRG: 418 | Disposition: A | Discharge status: Home / Self Care | Payer: Medicare Other | Attending: Surgery | Admitting: Surgery

## 2021-12-30 ENCOUNTER — Emergency Department: Payer: Medicare Other

## 2021-12-30 ENCOUNTER — Other Ambulatory Visit: Payer: Self-pay

## 2021-12-30 ENCOUNTER — Encounter: Payer: Self-pay | Admitting: Emergency Medicine

## 2021-12-30 DIAGNOSIS — R7401 Elevation of levels of liver transaminase levels: Secondary | ICD-10-CM

## 2021-12-30 DIAGNOSIS — K812 Acute cholecystitis with chronic cholecystitis: Principal | ICD-10-CM | POA: Diagnosis present

## 2021-12-30 DIAGNOSIS — R710 Precipitous drop in hematocrit: Secondary | ICD-10-CM | POA: Diagnosis not present

## 2021-12-30 DIAGNOSIS — Z791 Long term (current) use of non-steroidal anti-inflammatories (NSAID): Secondary | ICD-10-CM

## 2021-12-30 DIAGNOSIS — R1011 Right upper quadrant pain: Principal | ICD-10-CM | POA: Diagnosis present

## 2021-12-30 DIAGNOSIS — Z833 Family history of diabetes mellitus: Secondary | ICD-10-CM

## 2021-12-30 DIAGNOSIS — M1990 Unspecified osteoarthritis, unspecified site: Secondary | ICD-10-CM | POA: Diagnosis present

## 2021-12-30 DIAGNOSIS — E119 Type 2 diabetes mellitus without complications: Secondary | ICD-10-CM | POA: Diagnosis present

## 2021-12-30 DIAGNOSIS — J45909 Unspecified asthma, uncomplicated: Secondary | ICD-10-CM | POA: Diagnosis present

## 2021-12-30 DIAGNOSIS — Z8541 Personal history of malignant neoplasm of cervix uteri: Secondary | ICD-10-CM

## 2021-12-30 DIAGNOSIS — Z923 Personal history of irradiation: Secondary | ICD-10-CM

## 2021-12-30 DIAGNOSIS — Z20822 Contact with and (suspected) exposure to covid-19: Secondary | ICD-10-CM | POA: Diagnosis present

## 2021-12-30 DIAGNOSIS — Z9221 Personal history of antineoplastic chemotherapy: Secondary | ICD-10-CM

## 2021-12-30 DIAGNOSIS — Z7985 Long-term (current) use of injectable non-insulin antidiabetic drugs: Secondary | ICD-10-CM

## 2021-12-30 DIAGNOSIS — Z9851 Tubal ligation status: Secondary | ICD-10-CM

## 2021-12-30 DIAGNOSIS — F419 Anxiety disorder, unspecified: Secondary | ICD-10-CM | POA: Diagnosis present

## 2021-12-30 DIAGNOSIS — Z79899 Other long term (current) drug therapy: Secondary | ICD-10-CM

## 2021-12-30 LAB — BLOOD GAS, VENOUS
Acid-Base Excess: 1 mmol/L (ref 0.0–2.0)
Bicarbonate: 25.2 mmol/L (ref 20.0–28.0)
O2 Saturation: 95.3 %
Patient temperature: 37
pCO2, Ven: 38 mmHg — ABNORMAL LOW (ref 44–60)
pH, Ven: 7.43 (ref 7.25–7.43)
pO2, Ven: 65 mmHg — ABNORMAL HIGH (ref 32–45)

## 2021-12-30 LAB — CBC WITH DIFFERENTIAL/PLATELET
Abs Immature Granulocytes: 0.04 10*3/uL (ref 0.00–0.07)
Basophils Absolute: 0 10*3/uL (ref 0.0–0.1)
Basophils Relative: 0 %
Eosinophils Absolute: 0.1 10*3/uL (ref 0.0–0.5)
Eosinophils Relative: 2 %
HCT: 45.1 % (ref 36.0–46.0)
Hemoglobin: 15.1 g/dL — ABNORMAL HIGH (ref 12.0–15.0)
Immature Granulocytes: 1 %
Lymphocytes Relative: 11 %
Lymphs Abs: 0.8 10*3/uL (ref 0.7–4.0)
MCH: 30 pg (ref 26.0–34.0)
MCHC: 33.5 g/dL (ref 30.0–36.0)
MCV: 89.7 fL (ref 80.0–100.0)
Monocytes Absolute: 0.5 10*3/uL (ref 0.1–1.0)
Monocytes Relative: 7 %
Neutro Abs: 5.4 10*3/uL (ref 1.7–7.7)
Neutrophils Relative %: 79 %
Platelets: 188 10*3/uL (ref 150–400)
RBC: 5.03 MIL/uL (ref 3.87–5.11)
RDW: 12.3 % (ref 11.5–15.5)
WBC: 6.8 10*3/uL (ref 4.0–10.5)
nRBC: 0 % (ref 0.0–0.2)

## 2021-12-30 LAB — COMPREHENSIVE METABOLIC PANEL
ALT: 114 U/L — ABNORMAL HIGH (ref 0–44)
AST: 44 U/L — ABNORMAL HIGH (ref 15–41)
Albumin: 3.2 g/dL — ABNORMAL LOW (ref 3.5–5.0)
Alkaline Phosphatase: 121 U/L (ref 38–126)
Anion gap: 12 (ref 5–15)
BUN: 23 mg/dL — ABNORMAL HIGH (ref 6–20)
CO2: 24 mmol/L (ref 22–32)
Calcium: 9.3 mg/dL (ref 8.9–10.3)
Chloride: 99 mmol/L (ref 98–111)
Creatinine, Ser: 0.67 mg/dL (ref 0.44–1.00)
GFR, Estimated: >60 mL/min (ref >60)
Glucose, Bld: 309 mg/dL — ABNORMAL HIGH (ref 70–99)
Potassium: 2.8 mmol/L — ABNORMAL LOW (ref 3.5–5.1)
Sodium: 135 mmol/L (ref 135–145)
Total Bilirubin: 1.8 mg/dL — ABNORMAL HIGH (ref 0.3–1.2)
Total Protein: 7.3 g/dL (ref 6.5–8.1)

## 2021-12-30 LAB — HEPATITIS PANEL, ACUTE
HCV Ab: NONREACTIVE
Hep A IgM: NONREACTIVE
Hep B C IgM: NONREACTIVE
Hepatitis B Surface Ag: NONREACTIVE

## 2021-12-30 LAB — CBG MONITORING, ED
Glucose-Capillary: 293 mg/dL — ABNORMAL HIGH (ref 70–99)
Glucose-Capillary: 271 mg/dL — ABNORMAL HIGH (ref 70–99)
Glucose-Capillary: 181 mg/dL — ABNORMAL HIGH (ref 70–99)

## 2021-12-30 LAB — RESP PANEL BY RT-PCR (FLU A&B, COVID) ARPGX2
Influenza A by PCR: NEGATIVE
Influenza B by PCR: NEGATIVE
SARS Coronavirus 2 by RT PCR: NEGATIVE

## 2021-12-30 LAB — GLUCOSE, CAPILLARY: Glucose-Capillary: 174 mg/dL — ABNORMAL HIGH (ref 70–99)

## 2021-12-30 LAB — LACTIC ACID, PLASMA: Lactic Acid, Venous: 1.6 mmol/L (ref 0.5–1.9)

## 2021-12-30 LAB — BETA-HYDROXYBUTYRIC ACID: Beta-Hydroxybutyric Acid: 0.23 mmol/L (ref 0.05–0.27)

## 2021-12-30 LAB — LIPASE, BLOOD: Lipase: 25 U/L (ref 11–51)

## 2021-12-30 MED ORDER — POTASSIUM CHLORIDE CRYS ER 20 MEQ PO TBCR
40.0000 meq | EXTENDED_RELEASE_TABLET | Freq: Once | ORAL | Status: AC
  Administered 2021-12-30: 40 meq via ORAL
  Filled 2021-12-30: qty 2

## 2021-12-30 MED ORDER — BENZONATATE 100 MG PO CAPS: 100.0000 mg | ORAL_CAPSULE | Freq: Three times a day (TID) | ORAL | Status: DC | PRN

## 2021-12-30 MED ORDER — SODIUM CHLORIDE 0.9 % IV SOLN: INTRAVENOUS | Status: DC | PRN

## 2021-12-30 MED ORDER — TRAMADOL HCL 50 MG PO TABS
50.0000 mg | ORAL_TABLET | Freq: Four times a day (QID) | ORAL | Status: DC | PRN
  Administered 2021-12-31: 50 mg via ORAL
  Filled 2021-12-30: qty 1

## 2021-12-30 MED ORDER — HYDROCODONE-ACETAMINOPHEN 5-325 MG PO TABS
1.0000 | ORAL_TABLET | ORAL | Status: DC | PRN
  Administered 2021-12-30 – 2022-01-03 (×10): 2 via ORAL
  Filled 2021-12-30 (×11): qty 2

## 2021-12-30 MED ORDER — ENOXAPARIN SODIUM 40 MG/0.4ML IJ SOSY
40.0000 mg | PREFILLED_SYRINGE | INTRAMUSCULAR | Status: DC
  Administered 2021-12-31: 40 mg via SUBCUTANEOUS
  Filled 2021-12-30: qty 0.4

## 2021-12-30 MED ORDER — INSULIN ASPART 100 UNIT/ML IJ SOLN
0.0000 [IU] | Freq: Three times a day (TID) | INTRAMUSCULAR | Status: DC
  Administered 2021-12-31: 2 [IU] via SUBCUTANEOUS
  Administered 2021-12-31: 5 [IU] via SUBCUTANEOUS
  Administered 2022-01-01: 2 [IU] via SUBCUTANEOUS
  Administered 2022-01-01: 3 [IU] via SUBCUTANEOUS
  Administered 2022-01-02: 8 [IU] via SUBCUTANEOUS
  Administered 2022-01-02: 3 [IU] via SUBCUTANEOUS
  Administered 2022-01-02: 8 [IU] via SUBCUTANEOUS
  Administered 2022-01-03: 3 [IU] via SUBCUTANEOUS
  Administered 2022-01-03: 2 [IU] via SUBCUTANEOUS
  Filled 2021-12-30 (×9): qty 1

## 2021-12-30 MED ORDER — SIMETHICONE 80 MG PO CHEW
40.0000 mg | CHEWABLE_TABLET | Freq: Four times a day (QID) | ORAL | Status: DC | PRN
  Administered 2021-12-31 – 2022-01-02 (×3): 40 mg via ORAL
  Filled 2021-12-30 (×4): qty 1

## 2021-12-30 MED ORDER — ONDANSETRON HCL 4 MG/2ML IJ SOLN
4.0000 mg | Freq: Four times a day (QID) | INTRAMUSCULAR | Status: DC | PRN
  Administered 2022-01-01: 4 mg via INTRAVENOUS
  Filled 2021-12-30 (×2): qty 2

## 2021-12-30 MED ORDER — VENLAFAXINE HCL ER 75 MG PO CP24
75.0000 mg | ORAL_CAPSULE | Freq: Every day | ORAL | Status: DC
Start: 2021-12-31 — End: 2021-12-30

## 2021-12-30 MED ORDER — MONTELUKAST SODIUM 10 MG PO TABS: 10.0000 mg | ORAL_TABLET | Freq: Every day | ORAL | Status: DC

## 2021-12-30 MED ORDER — ALBUTEROL SULFATE (2.5 MG/3ML) 0.083% IN NEBU
3.0000 mL | INHALATION_SOLUTION | RESPIRATORY_TRACT | Status: DC | PRN
  Filled 2021-12-30: qty 3

## 2021-12-30 MED ORDER — ONDANSETRON 4 MG PO TBDP: 4.0000 mg | ORAL_TABLET | Freq: Four times a day (QID) | ORAL | Status: DC | PRN

## 2021-12-30 MED ORDER — KETOROLAC TROMETHAMINE 30 MG/ML IJ SOLN
15.0000 mg | Freq: Once | INTRAMUSCULAR | Status: AC
  Administered 2021-12-30: 15 mg via INTRAVENOUS
  Filled 2021-12-30: qty 1

## 2021-12-30 MED ORDER — DOCUSATE SODIUM 100 MG PO CAPS: 100.0000 mg | ORAL_CAPSULE | Freq: Two times a day (BID) | ORAL | Status: DC | PRN

## 2021-12-30 MED ORDER — DEXTROSE 5 % IV SOLN: INTRAVENOUS | Status: DC

## 2021-12-30 MED ORDER — SODIUM CHLORIDE 0.9 % IV SOLN
2.0000 g | INTRAVENOUS | Status: DC
  Administered 2021-12-31 (×2): 2 g via INTRAVENOUS
  Filled 2021-12-30: qty 2
  Filled 2021-12-30: qty 20
  Filled 2021-12-30: qty 2

## 2021-12-30 MED ORDER — FLUTICASONE PROPIONATE HFA 220 MCG/ACT IN AERO: 2.0000 | INHALATION_SPRAY | Freq: Two times a day (BID) | RESPIRATORY_TRACT | Status: DC

## 2021-12-30 MED ORDER — SODIUM CHLORIDE 0.9 % IV BOLUS
1000.0000 mL | Freq: Once | INTRAVENOUS | Status: AC
  Administered 2021-12-30: 1000 mL via INTRAVENOUS

## 2021-12-30 MED ORDER — PANTOPRAZOLE SODIUM 40 MG IV SOLR
40.0000 mg | Freq: Every day | INTRAVENOUS | Status: DC
  Administered 2021-12-30 – 2022-01-02 (×3): 40 mg via INTRAVENOUS
  Filled 2021-12-30 (×3): qty 10

## 2021-12-30 MED ORDER — BUDESONIDE 0.25 MG/2ML IN SUSP
0.2500 mg | Freq: Two times a day (BID) | RESPIRATORY_TRACT | Status: DC
  Administered 2021-12-31 – 2022-01-03 (×5): 0.25 mg via RESPIRATORY_TRACT
  Filled 2021-12-30 (×6): qty 2

## 2021-12-30 NOTE — ED Notes (Signed)
Told transport to take pt to floor by error- room was marked clean but report was not marked by receiving RN yet. Called floor to give heads up and give verbal report to RN, was told room was dirty and pt had to come back to ED. Called transport to notify, charge RN notified.  ?

## 2021-12-30 NOTE — ED Notes (Signed)
Floor RN verifying room is ready, transportation called.  ?

## 2021-12-30 NOTE — H&P (Signed)
Subjective:  ? ?CC: RUQ pain ? ?HPI: ? Kendra Herring is a 61 y.o. female who is consulted by [name] for evaluation of above cc.  Symptoms were first noted several days ago. Pain is sharp, very intense with latest episodes.  Similar episodes in the past that resolved on own.  Associated with nothing, exacerbated by nothing specific. ? ?Some recent constipation but last BM was today.      ? ?Past Medical History:  has a past medical history of Anxiety, Arrhythmia, Arthritis, Asthma, Cervical cancer ([name]) (03/2017), Diabetes mellitus without complication ([name]), Hernia of abdominal wall (04/17/2017), Personal history of chemotherapy, and Personal history of radiation therapy. ? ?Past Surgical History:  has a past surgical history that includes Hip surgery and Tubal ligation. ? ?Family History: family history includes Diabetes in her mother. ? ?Social History:  reports that she has never smoked. She has never used smokeless tobacco. She reports that she does not drink alcohol and does not use drugs. ? ?Current Medications:  ?Prior to Admission medications   ?Medication Sig Start Date End Date Taking? Authorizing Provider  ?Accu-Chek FastClix Lancets MISC USE TID UTD 03/30/19   [provider]  ?ACCU-CHEK GUIDE test strip USE TID UTD 03/05/19   [provider]  ?Albuterol Sulfate 108 (90 Base) MCG/ACT AEPB Inhale 2 puffs into the lungs every 4 (four) hours as needed.     [provider]  ?B-D UF III MINI PEN NEEDLES 31G X 5 MM MISC Inject into the skin daily. 07/24/20   [provider]  ?benzonatate (TESSALON) 100 MG capsule Take 100 mg by mouth 3 (three) times daily as needed. 02/01/21   [provider]  ?Cholecalciferol 1.25 MG (50000 UT) capsule Take 1 capsule by mouth once a week. 11/29/18   [provider]  ?dicyclomine (BENTYL) 20 MG tablet Take 1 tablet (20 mg total) by mouth every 6 (six) hours. 12/27/21   [name], MD  ?escitalopram (LEXAPRO) 10 MG tablet Take 10  mg by mouth daily. ?Patient not taking: Reported on 02/09/2021 08/19/20   [provider]  ?FLOVENT HFA 220 MCG/ACT inhaler Inhale 2 puffs into the lungs 2 (two) times daily. 06/15/20   [provider]  ?JARDIANCE 10 MG TABS tablet Take 10 mg by mouth daily. 05/31/20   [provider]  ?lactulose (CHRONULAC) 10 GM/15ML solution Take 30 mLs (20 g total) by mouth daily as needed for mild constipation. 12/27/21   [name], MD  ?meloxicam (MOBIC) 15 MG tablet Take 15 mg by mouth daily. ?Patient not taking: Reported on 02/09/2021    [provider]  ?montelukast (SINGULAIR) 10 MG tablet Take 10 mg by mouth daily. 04/06/20   [provider]  ?Olopatadine HCl 0.2 % SOLN Apply 1 drop to eye daily. ?Patient not taking: Reported on 02/09/2021 04/08/20   [provider]  ?ondansetron (ZOFRAN-ODT) 4 MG disintegrating tablet Take 1 tablet (4 mg total) by mouth every 8 (eight) hours as needed for nausea or vomiting. 12/27/21   [name], MD  ?sertraline (ZOLOFT) 25 MG tablet Take 25 mg by mouth daily. ?Patient not taking: Reported on 02/09/2021    [provider]  ?traZODone (DESYREL) 50 MG tablet Take 50 mg by mouth at bedtime as needed.  ?Patient not taking: Reported on 02/09/2021 05/22/19   [provider]  ?venlafaxine XR (EFFEXOR-XR) 37.5 MG 24 hr capsule TAKE 1 CAPSULE BY MOUTH EVERY DAY FOR 1 WEEK. INCREASE TO 2  BY MOUTH EVERY DAY 02/01/21   [provider]  ?VICTOZA 18 MG/3ML SOPN Inject into the skin. 08/22/20   [provider]  ? ? ?Allergies:  ?Allergies as of 12/30/2021 - Review Complete 12/30/2021  ?Allergen Reaction Noted  ? Pseudoephedrine Palpitations 07/04/2017  ? ? ?ROS:  ?General: Denies weight loss, weight gain, fatigue, fevers, chills, and night sweats. ?Eyes: Denies blurry vision, double vision, eye pain, itchy eyes, and tearing. ?Ears: Denies hearing loss, earache, and ringing in ears. ?Nose: Denies sinus pain, congestion,  infections, runny nose, and nosebleeds. ?Mouth/throat: Denies hoarseness, sore throat, bleeding gums, and difficulty swallowing. ?Heart: Denies chest pain, palpitations, racing heart, irregular heartbeat, leg pain or swelling, and decreased activity tolerance. ?Respiratory: Denies breathing difficulty, shortness of breath, wheezing, cough, and sputum. ?GI: Denies change in appetite, heartburn, nausea, vomiting, constipation, diarrhea, and blood in stool. ?GU: Denies difficulty urinating, pain with urinating, urgency, frequency, blood in urine. ?Musculoskeletal: Denies joint stiffness, pain, swelling, muscle weakness. ?Skin: Denies rash, itching, mass, tumors, sores, and boils ?Neurologic: Denies headache, fainting, dizziness, seizures, numbness, and tingling. ?Psychiatric: Denies depression, anxiety, difficulty sleeping, and memory loss. ?Endocrine: Denies heat or cold intolerance, and increased thirst or urination. ?Blood/lymph: Denies easy bruising, and swollen glands ? ? ?  ?Objective:  ?  ? ?BP 115/73   Pulse 68   Temp 97.6 ?F (36.4 ?C) (Oral)   Resp 18   Ht '4\' 11"'$  (1.499 m)   Wt 63 kg   SpO2 96%   BMI 28.07 kg/m?  ? ? ?Constitutional :  alert, cooperative, appears stated age, and no distress  ?Lymphatics/Throat:  no asymmetry, masses, or scars  ?Respiratory:  clear to auscultation bilaterally  ?Cardiovascular:  regular rate and rhythm  ?Gastrointestinal: Soft, no guarding, minimal TTP in RUQ .   ?Musculoskeletal: Steady movement  ?Skin: Cool and moist  ?Psychiatric: Normal affect, non-agitated, not confused  ?   ?  ?LABS:  ?CMP Latest Ref Rng & Units 12/30/2021 12/26/2021 05/30/2018  ?Glucose 70 - 99 mg/dL 309(H) 214(H) 151(H)  ?BUN 6 - 20 mg/dL 23(H) 15 16  ?Creatinine 0.44 - 1.00 mg/dL 0.67 0.62 0.71  ?Sodium 135 - 145 mmol/L 135 140 139  ?Potassium 3.5 - 5.1 mmol/L 2.8(L) 3.8 3.6  ?Chloride 98 - 111 mmol/L 99 104 104  ?CO2 22 - 32 mmol/L '24 24 24  '$ ?Calcium 8.9 - 10.3 mg/dL 9.3 9.8 9.7  ?Total Protein 6.5 -  8.1 g/dL 7.3 8.3(H) -  ?Total Bilirubin 0.3 - 1.2 mg/dL 1.8(H) 0.8 -  ?Alkaline Phos 38 - 126 U/L 121 82 -  ?AST 15 - 41 U/L 44(H) 29 -  ?ALT 0 - 44 U/L 114(H) 48(H) -  ? ?CBC Latest Ref Rng & Units 12/30/2021 12/26/2021 05/30/2018  ?WBC 4.0 - 10.5 K/uL 6.8 9.5 5.8  ?Hemoglobin 12.0 - 15.0 g/dL 15.1(H) 16.3(H) 14.3  ?Hematocrit 36.0 - 46.0 % 45.1 49.2(H) 41.7  ?Platelets 150 - 400 K/uL 188 266 246  ? ? ? ?RADS: ?CLINICAL DATA:  Right upper quadrant pain ?  ?EXAM: ?ULTRASOUND ABDOMEN LIMITED RIGHT UPPER QUADRANT ?  ?COMPARISON:  12/27/2021 ?  ?FINDINGS: ?Gallbladder: ?  ?Partially contracted gallbladder. No sludge or shadowing stones. No ?sonographic Murphy sign noted by sonographer. ?  ?Common bile duct: ?  ?Diameter: 5 mm. ?  ?Liver: ?  ?No focal lesion identified. Diffusely increased hepatic parenchymal ?echogenicity. Portal vein is patent on color Doppler imaging with ?normal direction of blood flow towards the liver. ?  ?Other:  None. ?  ?IMPRESSION: ?1. The echogenicity of the liver is increased. This is a nonspecific ?finding but is most commonly seen with fatty infiltration of the ?liver. There are no obvious focal liver lesions. ?2. Partially contracted gallbladder without sonographic evidence of ?cholecystitis. ?  ?  ?Electronically Signed ?  By: Davina Poke D.O. ?  On: 12/30/2021 16:57 ?  ?Assessment:  ? ?   ?RUQ pain ?Elevated LFTs ? ?Plan:  ? ? Acalculous cholecystitis?  History somewhat consistent but minimally symptomatic now.  Will proceed with HIDA scan to confirm.  Abx in the meantime just in case, CLD, no morphine until procedure completed.  ? ?DThe patient understands the risks, any and all questions were answered to the patient's satisfaction. ? ?labs/images/medications/previous chart entries reviewed personally and relevant changes/updates noted above. ? ?

## 2021-12-30 NOTE — ED Provider Notes (Incomplete)
[facility] Provider Note   Event Date/Time   First MD Initiated Contact with Patient 12/30/21 1458     (approximate) History  Abdominal Pain  HPI Kendra Herring is a 61 y.o. female with a stated past medical history of cervical cancer in remission and type 2 diabetes who presents for the second time in 4 days with complaints of right upper quadrant abdominal pain as well as associated nausea/vomiting and diarrhea.  Patient describes this pain as a 10/10, burning pain that radiates around to her back.  Patient states that this pain is worsened after a bowel movement.  Patient denies any chest pain, shortness of breath, recent sick contacts, fevers, or weakness/numbness/paresthesias in any extremity.  Patient denies any recent medication changes, recent travel Physical Exam  Triage Vital Signs: ED Triage Vitals  Enc Vitals Group     BP 12/30/21 1500 (!) 157/78     Pulse Rate 12/30/21 1500 85     Resp 12/30/21 1500 18     Temp 12/30/21 1500 97.6 F (36.4 C)     Temp Source 12/30/21 1500 Oral     SpO2 12/30/21 1456 95 %     Weight 12/30/21 1501 139 lb (63 kg)     Height 12/30/21 1501 '4\' 11"'$  (1.499 m)     Head Circumference --      Peak Flow --      Pain Score 12/30/21 1501 10     Pain Loc --      Pain Edu? --      Excl. in [place]? --    Most recent vital signs: Vitals:   12/30/21 1456 12/30/21 1500  BP:  (!) 157/78  Pulse:  85  Resp:  18  Temp:  97.6 F (36.4 C)  SpO2: 95% 96%   General: Awake, oriented x4. CV:  Good peripheral perfusion.  Resp:  Normal effort.  Abd:  No distention.  Mild right upper quadrant tenderness to palpation Other:  Middle-aged overweight Caucasian female laying in bed in no distress ED Results / Procedures / Treatments  Labs (all labs ordered are listed, but only abnormal results are displayed) Labs Reviewed  CBG MONITORING, ED - Abnormal; Notable for the following components:      Result Value   Glucose-Capillary 293 (*)     All other components within normal limits  RESP PANEL BY RT-PCR (FLU A&B, COVID) ARPGX2  LACTIC ACID, PLASMA  LACTIC ACID, PLASMA  COMPREHENSIVE METABOLIC PANEL  CBC WITH DIFFERENTIAL/PLATELET  LIPASE, BLOOD  BLOOD GAS, VENOUS  BETA-HYDROXYBUTYRIC ACID   EKG ED ECG REPORT I, Naaman Plummer, the attending physician, personally viewed and interpreted this ECG. Date: 12/30/2021 EKG Time: *** Rate: *** Rhythm: normal sinus rhythm QRS Axis: normal Intervals: normal ST/T Wave abnormalities: normal Narrative Interpretation: no evidence of acute ischemia RADIOLOGY ED MD interpretation:  *** -Agree with radiology assessment Official radiology report(s): No results found. PROCEDURES: Critical Care performed: {CriticalCareYesNo:19197::"Yes, see critical care procedure note(s)","No"} Procedures MEDICATIONS ORDERED IN ED: Medications  sodium chloride 0.9 % bolus 1,000 mL (has no administration in time range)   IMPRESSION / MDM / ASSESSMENT AND PLAN / ED COURSE  I reviewed the triage vital signs and the nursing notes.                             Differential diagnosis includes, but is not limited to, *** {**The patient is on the cardiac monitor  to evaluate for evidence of arrhythmia and/or significant heart rate changes.**} {Remember to include, when applicable, any/all of the following data: independent review of imaging independent review of labs (comment specifically on pertinent positives and negatives) review of specific prior hospitalizations, PCP/specialist notes, etc. discuss meds given and prescribed document any discussion with consultants (including hospitalists) any clinical decision tools you used and why (PECARN, NEXUS, etc.) did you consider admitting the patient? document social determinants of health affecting patient's care (homelessness, inability to follow up in a timely fashion, etc) document any pre-existing conditions increasing risk on current visit (e.g.  diabetes and HTN increasing danger of high-risk chest pain/ACS) describes what meds you gave (especially parenteral) and why any other interventions?:1}   FINAL CLINICAL IMPRESSION(S) / ED DIAGNOSES   Final diagnoses:  None   Rx / DC Orders   ED Discharge Orders     None      Note:  This document was prepared using Dragon voice recognition software and may include unintentional dictation errors.

## 2021-12-30 NOTE — ED Triage Notes (Signed)
Pt coming from home via [facility]. Per EMS, pt is having abdominal pain in RUQ that started Saturday and got worsen today after pt had BM. Pt states she has been seen this past weekend for a blockage she had. Pt denies NVD.  ? ?Abdomen is tender upon papaltion in RUQ.  ?

## 2021-12-30 NOTE — ED Notes (Signed)
Pt appears to be a poor health historian, does not easily comprehend or remember education provided on testing and plan of care.  ?

## 2021-12-31 ENCOUNTER — Inpatient Hospital Stay: Payer: Medicare Other

## 2021-12-31 LAB — CBC
HCT: 43.6 % (ref 36.0–46.0)
Hemoglobin: 14.6 g/dL (ref 12.0–15.0)
MCH: 30.2 pg (ref 26.0–34.0)
MCHC: 33.5 g/dL (ref 30.0–36.0)
MCV: 90.3 fL (ref 80.0–100.0)
Platelets: 200 10*3/uL (ref 150–400)
RBC: 4.83 MIL/uL (ref 3.87–5.11)
RDW: 12.5 % (ref 11.5–15.5)
WBC: 5.5 10*3/uL (ref 4.0–10.5)
nRBC: 0 % (ref 0.0–0.2)

## 2021-12-31 LAB — HEPATIC FUNCTION PANEL
ALT: 94 U/L — ABNORMAL HIGH (ref 0–44)
AST: 49 U/L — ABNORMAL HIGH (ref 15–41)
Albumin: 2.9 g/dL — ABNORMAL LOW (ref 3.5–5.0)
Alkaline Phosphatase: 113 U/L (ref 38–126)
Bilirubin, Direct: 0.7 mg/dL — ABNORMAL HIGH (ref 0.0–0.2)
Indirect Bilirubin: 0.7 mg/dL (ref 0.3–0.9)
Total Bilirubin: 1.4 mg/dL — ABNORMAL HIGH (ref 0.3–1.2)
Total Protein: 7 g/dL (ref 6.5–8.1)

## 2021-12-31 LAB — GLUCOSE, CAPILLARY
Glucose-Capillary: 147 mg/dL — ABNORMAL HIGH (ref 70–99)
Glucose-Capillary: 113 mg/dL — ABNORMAL HIGH (ref 70–99)
Glucose-Capillary: 212 mg/dL — ABNORMAL HIGH (ref 70–99)
Glucose-Capillary: 187 mg/dL — ABNORMAL HIGH (ref 70–99)

## 2021-12-31 LAB — BASIC METABOLIC PANEL
Anion gap: 9 (ref 5–15)
BUN: 24 mg/dL — ABNORMAL HIGH (ref 6–20)
CO2: 25 mmol/L (ref 22–32)
Calcium: 9 mg/dL (ref 8.9–10.3)
Chloride: 106 mmol/L (ref 98–111)
Creatinine, Ser: 0.57 mg/dL (ref 0.44–1.00)
GFR, Estimated: >60 mL/min (ref >60)
Glucose, Bld: 149 mg/dL — ABNORMAL HIGH (ref 70–99)
Potassium: 3.4 mmol/L — ABNORMAL LOW (ref 3.5–5.1)
Sodium: 140 mmol/L (ref 135–145)

## 2021-12-31 LAB — HIV ANTIBODY (ROUTINE TESTING W REFLEX): HIV Screen 4th Generation wRfx: NONREACTIVE

## 2021-12-31 LAB — PHOSPHORUS: Phosphorus: 3.3 mg/dL (ref 2.5–4.6)

## 2021-12-31 LAB — MAGNESIUM: Magnesium: 2.2 mg/dL (ref 1.7–2.4)

## 2021-12-31 MED ORDER — DIPHENHYDRAMINE HCL 25 MG PO CAPS
25.0000 mg | ORAL_CAPSULE | Freq: Once | ORAL | Status: AC
  Administered 2021-12-31: 25 mg via ORAL
  Filled 2021-12-31: qty 1

## 2021-12-31 MED ORDER — INDOCYANINE GREEN 25 MG IV SOLR
1.2500 mg | Freq: Once | INTRAVENOUS | Status: AC
Start: 2022-01-01 — End: 2022-01-01
  Administered 2022-01-01: 1.25 mg via INTRAVENOUS
  Filled 2021-12-31: qty 0.5

## 2021-12-31 MED ORDER — MORPHINE SULFATE (PF) 2 MG/ML IV SOLN
1.0000 mg | INTRAVENOUS | Status: DC | PRN
Start: 2021-12-31 — End: 2022-01-02
  Administered 2022-01-01 – 2022-01-02 (×5): 1 mg via INTRAVENOUS
  Filled 2021-12-31 (×6): qty 1

## 2021-12-31 MED ORDER — KETOROLAC TROMETHAMINE 15 MG/ML IJ SOLN
15.0000 mg | Freq: Once | INTRAMUSCULAR | Status: AC
  Administered 2021-12-31: 15 mg via INTRAVENOUS
  Filled 2021-12-31: qty 1

## 2021-12-31 MED ORDER — TECHNETIUM TC 99M MEBROFENIN IV KIT
5.0000 | PACK | Freq: Once | INTRAVENOUS | Status: AC | PRN
  Administered 2021-12-31: 5.4 via INTRAVENOUS

## 2021-12-31 NOTE — Plan of Care (Signed)

## 2021-12-31 NOTE — Progress Notes (Signed)
?  Chaplain On-Call responded to [place] referral from [facility]. ? ?The patient's request is for prayer. ? ?At 1135 hours, Chaplain learned from Staff that the patient is in the Nuclear Medicine area for tests. ? ?Chaplain will attempt visit later today. ? ?Chaplain Pollyann Samples ?M.Div., [facility] ?

## 2021-12-31 NOTE — Progress Notes (Signed)
Dr Lysle Pearl paged concerning patient itching. ?

## 2021-12-31 NOTE — H&P (View-Only) (Signed)
Subjective:  ?CC: ?Kendra Herring is a 61 y.o. female  Hospital stay day 1,   RUQ pain ? ?HPI: ?No acute issues overnight.  Pain improved with meds. ? ?ROS:  ?General: Denies weight loss, weight gain, fatigue, fevers, chills, and night sweats. ?Heart: Denies chest pain, palpitations, racing heart, irregular heartbeat, leg pain or swelling, and decreased activity tolerance. ?Respiratory: Denies breathing difficulty, shortness of breath, wheezing, cough, and sputum. ?GI: Denies change in appetite, heartburn, nausea, vomiting, constipation, diarrhea, and blood in stool. ?GU: Denies difficulty urinating, pain with urinating, urgency, frequency, blood in urine. ? ? ?Objective:  ? ?Temp:  [97.6 ?F (36.4 ?C)-98.3 ?F (36.8 ?C)] 98.3 ?F (36.8 ?C) (03/11 0800) ?Pulse Rate:  [60-87] 60 (03/11 0800) ?Resp:  [16-18] 18 (03/11 0800) ?BP: (105-157)/(50-83) 111/78 (03/11 0800) ?SpO2:  [94 %-100 %] 100 % (03/11 0800) ?Weight:  [63 kg-64.1 kg] 64.1 kg (03/10 2204)     Height: '4\' 11"'$  (149.9 cm) Weight: 64.1 kg BMI (Calculated): 28.53  ? ?Intake/Output this shift:  ? ?Intake/Output Summary (Last 24 hours) at 12/31/2021 1101 ?Last data filed at 12/31/2021 0900 ?Gross per 24 hour  ?Intake 475.46 ml  ?Output --  ?Net 475.46 ml  ? ? ?Constitutional :  alert, cooperative, appears stated age, and no distress  ?Respiratory:  clear to auscultation bilaterally  ?Cardiovascular:  regular rate and rhythm  ?Gastrointestinal: soft, non-tender; bowel sounds normal; no masses,  no organomegaly.   ?Skin: Cool and moist.   ?Psychiatric: Normal affect, non-agitated, not confused  ?   ?  ?LABS:  ?CMP Latest Ref Rng & Units 12/31/2021 12/30/2021 12/30/2021  ?Glucose 70 - 99 mg/dL 149(H) - 309(H)  ?BUN 6 - 20 mg/dL 24(H) - 23(H)  ?Creatinine 0.44 - 1.00 mg/dL 0.57 0.65 0.67  ?Sodium 135 - 145 mmol/L 140 - 135  ?Potassium 3.5 - 5.1 mmol/L 3.4(L) - 2.8(L)  ?Chloride 98 - 111 mmol/L 106 - 99  ?CO2 22 - 32 mmol/L 25 - 24  ?Calcium 8.9 - 10.3 mg/dL 9.0 - 9.3  ?Total  Protein 6.5 - 8.1 g/dL 7.0 - 7.3  ?Total Bilirubin 0.3 - 1.2 mg/dL 1.4(H) - 1.8(H)  ?Alkaline Phos 38 - 126 U/L 113 - 121  ?AST 15 - 41 U/L 49(H) - 44(H)  ?ALT 0 - 44 U/L 94(H) - 114(H)  ? ?CBC Latest Ref Rng & Units 12/31/2021 12/30/2021 12/30/2021  ?WBC 4.0 - 10.5 K/uL 5.5 6.4 6.8  ?Hemoglobin 12.0 - 15.0 g/dL 14.6 15.6(H) 15.1(H)  ?Hematocrit 36.0 - 46.0 % 43.6 46.6(H) 45.1  ?Platelets 150 - 400 K/uL 200 207 188  ? ? ?RADS: ?CLINICAL DATA:  Right upper quadrant pain. ?  ?EXAM: ?NUCLEAR MEDICINE HEPATOBILIARY IMAGING WITH GALLBLADDER EF ?  ?TECHNIQUE: ?Sequential images of the abdomen were obtained out to 60 minutes ?following intravenous administration of radiopharmaceutical. After ?oral ingestion of Ensure, gallbladder ejection fraction was ?determined. At 60 min, normal ejection fraction is greater than 33%. ?  ?RADIOPHARMACEUTICALS:  5.4 mCi Tc-43m Choletec IV ?  ?COMPARISON:  None. ?  ?FINDINGS: ?Prompt uptake and biliary excretion of activity by the liver is ?seen. Biliary activity passes promptly into small bowel, consistent ?with patent common bile duct. ?  ?Gallbladder activity is seen initially on the 45 minute image. ?Imaging was continued to 90 minutes, however the gallbladder remains ?small in size. ?  ?Calculated gallbladder ejection fraction is 25%. (Normal gallbladder ?ejection fraction with Ensure is greater than 33%.) ?  ?IMPRESSION: ?Both cystic and common bile  ducts are patent. ?  ?Small gallbladder, with low gallbladder ejection fraction of 25%. ?  ?  ?Electronically Signed ?  By: Marlaine Hind M.D. ?  On: 12/31/2021 15:50 ?Assessment:  ? ?RUQ pain.  Biliary dyskinesia ? ?iscussed the risk of surgery including post-op infxn, seroma, biloma, chronic pain, poor-delayed wound healing, retained gallstone, conversion to open procedure, post-op SBO or ileus, and need for additional procedures to address said risks.  The risks of general anesthetic including MI, CVA, sudden death or even reaction to  anesthetic medications also discussed. Alternatives include continued observation.  Benefits include possible symptom relief, prevention of complications including acute cholecystitis, pancreatitis. ? ?Typical post operative recovery of 3-5 days rest, continued pain in area and incision sites, possible loose stools up to 4-6 weeks, also discussed. ? ?The patient understands the risks, any and all questions were answered to the patient's satisfaction. ? ? ? ?labs/images/medications/previous chart entries reviewed personally and relevant changes/updates noted above. ? ? ?

## 2021-12-31 NOTE — Care Management CC44 (Signed)
Condition Code 44 Documentation Completed ? ?Patient Details  ?Name: Kendra Herring ?MRN: [id number] ?Date of Birth: 16-Apr-1961 ? ? ?Condition Code 44 given:  Yes ?Patient signature on Condition Code 44 notice:  Yes ?Documentation of 2 MD's agreement:  Yes ?Code 44 added to claim:  Yes ? ? ? ?Izola Price, RN ?12/31/2021, 3:38 PM ? ?

## 2021-12-31 NOTE — Progress Notes (Signed)
Subjective:  ?CC: ?Kendra Herring is a 61 y.o. female  Hospital stay day 1,   RUQ pain ? ?HPI: ?No acute issues overnight.  Pain improved with meds. ? ?ROS:  ?General: Denies weight loss, weight gain, fatigue, fevers, chills, and night sweats. ?Heart: Denies chest pain, palpitations, racing heart, irregular heartbeat, leg pain or swelling, and decreased activity tolerance. ?Respiratory: Denies breathing difficulty, shortness of breath, wheezing, cough, and sputum. ?GI: Denies change in appetite, heartburn, nausea, vomiting, constipation, diarrhea, and blood in stool. ?GU: Denies difficulty urinating, pain with urinating, urgency, frequency, blood in urine. ? ? ?Objective:  ? ?Temp:  [97.6 ?F (36.4 ?C)-98.3 ?F (36.8 ?C)] 98.3 ?F (36.8 ?C) (03/11 0800) ?Pulse Rate:  [60-87] 60 (03/11 0800) ?Resp:  [16-18] 18 (03/11 0800) ?BP: (105-157)/(50-83) 111/78 (03/11 0800) ?SpO2:  [94 %-100 %] 100 % (03/11 0800) ?Weight:  [63 kg-64.1 kg] 64.1 kg (03/10 2204)     Height: '4\' 11"'$  (149.9 cm) Weight: 64.1 kg BMI (Calculated): 28.53  ? ?Intake/Output this shift:  ? ?Intake/Output Summary (Last 24 hours) at 12/31/2021 1101 ?Last data filed at 12/31/2021 0900 ?Gross per 24 hour  ?Intake 475.46 ml  ?Output --  ?Net 475.46 ml  ? ? ?Constitutional :  alert, cooperative, appears stated age, and no distress  ?Respiratory:  clear to auscultation bilaterally  ?Cardiovascular:  regular rate and rhythm  ?Gastrointestinal: soft, non-tender; bowel sounds normal; no masses,  no organomegaly.   ?Skin: Cool and moist.   ?Psychiatric: Normal affect, non-agitated, not confused  ?   ?  ?LABS:  ?CMP Latest Ref Rng & Units 12/31/2021 12/30/2021 12/30/2021  ?Glucose 70 - 99 mg/dL 149(H) - 309(H)  ?BUN 6 - 20 mg/dL 24(H) - 23(H)  ?Creatinine 0.44 - 1.00 mg/dL 0.57 0.65 0.67  ?Sodium 135 - 145 mmol/L 140 - 135  ?Potassium 3.5 - 5.1 mmol/L 3.4(L) - 2.8(L)  ?Chloride 98 - 111 mmol/L 106 - 99  ?CO2 22 - 32 mmol/L 25 - 24  ?Calcium 8.9 - 10.3 mg/dL 9.0 - 9.3  ?Total  Protein 6.5 - 8.1 g/dL 7.0 - 7.3  ?Total Bilirubin 0.3 - 1.2 mg/dL 1.4(H) - 1.8(H)  ?Alkaline Phos 38 - 126 U/L 113 - 121  ?AST 15 - 41 U/L 49(H) - 44(H)  ?ALT 0 - 44 U/L 94(H) - 114(H)  ? ?CBC Latest Ref Rng & Units 12/31/2021 12/30/2021 12/30/2021  ?WBC 4.0 - 10.5 K/uL 5.5 6.4 6.8  ?Hemoglobin 12.0 - 15.0 g/dL 14.6 15.6(H) 15.1(H)  ?Hematocrit 36.0 - 46.0 % 43.6 46.6(H) 45.1  ?Platelets 150 - 400 K/uL 200 207 188  ? ? ?RADS: ?CLINICAL DATA:  Right upper quadrant pain. ?  ?EXAM: ?NUCLEAR MEDICINE HEPATOBILIARY IMAGING WITH GALLBLADDER EF ?  ?TECHNIQUE: ?Sequential images of the abdomen were obtained out to 60 minutes ?following intravenous administration of radiopharmaceutical. After ?oral ingestion of Ensure, gallbladder ejection fraction was ?determined. At 60 min, normal ejection fraction is greater than 33%. ?  ?RADIOPHARMACEUTICALS:  5.4 mCi Tc-32m Choletec IV ?  ?COMPARISON:  None. ?  ?FINDINGS: ?Prompt uptake and biliary excretion of activity by the liver is ?seen. Biliary activity passes promptly into small bowel, consistent ?with patent common bile duct. ?  ?Gallbladder activity is seen initially on the 45 minute image. ?Imaging was continued to 90 minutes, however the gallbladder remains ?small in size. ?  ?Calculated gallbladder ejection fraction is 25%. (Normal gallbladder ?ejection fraction with Ensure is greater than 33%.) ?  ?IMPRESSION: ?Both cystic and common bile  ducts are patent. ?  ?Small gallbladder, with low gallbladder ejection fraction of 25%. ?  ?  ?Electronically Signed ?  By: Marlaine Hind M.D. ?  On: 12/31/2021 15:50 ?Assessment:  ? ?RUQ pain.  Biliary dyskinesia ? ?iscussed the risk of surgery including post-op infxn, seroma, biloma, chronic pain, poor-delayed wound healing, retained gallstone, conversion to open procedure, post-op SBO or ileus, and need for additional procedures to address said risks.  The risks of general anesthetic including MI, CVA, sudden death or even reaction to  anesthetic medications also discussed. Alternatives include continued observation.  Benefits include possible symptom relief, prevention of complications including acute cholecystitis, pancreatitis. ? ?Typical post operative recovery of 3-5 days rest, continued pain in area and incision sites, possible loose stools up to 4-6 weeks, also discussed. ? ?The patient understands the risks, any and all questions were answered to the patient's satisfaction. ? ? ? ?labs/images/medications/previous chart entries reviewed personally and relevant changes/updates noted above. ? ? ?

## 2022-01-01 ENCOUNTER — Inpatient Hospital Stay: Payer: Medicare Other | Admitting: Anesthesiology

## 2022-01-01 ENCOUNTER — Other Ambulatory Visit: Payer: Self-pay

## 2022-01-01 ENCOUNTER — Encounter
Admission: EM | Disposition: A | Discharge status: Home / Self Care | Payer: Self-pay | Source: Home / Self Care | Attending: Surgery

## 2022-01-01 DIAGNOSIS — Z9851 Tubal ligation status: Secondary | ICD-10-CM | POA: Diagnosis not present

## 2022-01-01 DIAGNOSIS — Z9221 Personal history of antineoplastic chemotherapy: Secondary | ICD-10-CM | POA: Diagnosis not present

## 2022-01-01 DIAGNOSIS — R1011 Right upper quadrant pain: Secondary | ICD-10-CM | POA: Diagnosis present

## 2022-01-01 DIAGNOSIS — F419 Anxiety disorder, unspecified: Secondary | ICD-10-CM | POA: Diagnosis present

## 2022-01-01 DIAGNOSIS — Z7985 Long-term (current) use of injectable non-insulin antidiabetic drugs: Secondary | ICD-10-CM | POA: Diagnosis not present

## 2022-01-01 DIAGNOSIS — J45909 Unspecified asthma, uncomplicated: Secondary | ICD-10-CM | POA: Diagnosis present

## 2022-01-01 DIAGNOSIS — K812 Acute cholecystitis with chronic cholecystitis: Secondary | ICD-10-CM | POA: Diagnosis present

## 2022-01-01 DIAGNOSIS — Z8541 Personal history of malignant neoplasm of cervix uteri: Secondary | ICD-10-CM | POA: Diagnosis not present

## 2022-01-01 DIAGNOSIS — Z791 Long term (current) use of non-steroidal anti-inflammatories (NSAID): Secondary | ICD-10-CM | POA: Diagnosis not present

## 2022-01-01 DIAGNOSIS — Z833 Family history of diabetes mellitus: Secondary | ICD-10-CM | POA: Diagnosis not present

## 2022-01-01 DIAGNOSIS — Z79899 Other long term (current) drug therapy: Secondary | ICD-10-CM | POA: Diagnosis not present

## 2022-01-01 DIAGNOSIS — Z923 Personal history of irradiation: Secondary | ICD-10-CM | POA: Diagnosis not present

## 2022-01-01 DIAGNOSIS — M1990 Unspecified osteoarthritis, unspecified site: Secondary | ICD-10-CM | POA: Diagnosis present

## 2022-01-01 DIAGNOSIS — Z20822 Contact with and (suspected) exposure to covid-19: Secondary | ICD-10-CM | POA: Diagnosis present

## 2022-01-01 DIAGNOSIS — R710 Precipitous drop in hematocrit: Secondary | ICD-10-CM | POA: Diagnosis not present

## 2022-01-01 DIAGNOSIS — E119 Type 2 diabetes mellitus without complications: Secondary | ICD-10-CM | POA: Diagnosis present

## 2022-01-01 LAB — CBC
HCT: 42.5 % (ref 36.0–46.0)
Hemoglobin: 14.1 g/dL (ref 12.0–15.0)
MCH: 30.2 pg (ref 26.0–34.0)
MCHC: 33.2 g/dL (ref 30.0–36.0)
MCV: 91 fL (ref 80.0–100.0)
Platelets: 199 10*3/uL (ref 150–400)
RBC: 4.67 MIL/uL (ref 3.87–5.11)
RDW: 12.8 % (ref 11.5–15.5)
WBC: 4.7 10*3/uL (ref 4.0–10.5)
nRBC: 0 % (ref 0.0–0.2)

## 2022-01-01 LAB — BASIC METABOLIC PANEL
Anion gap: 8 (ref 5–15)
BUN: 22 mg/dL — ABNORMAL HIGH (ref 6–20)
CO2: 25 mmol/L (ref 22–32)
Calcium: 9.1 mg/dL (ref 8.9–10.3)
Chloride: 106 mmol/L (ref 98–111)
Creatinine, Ser: 0.58 mg/dL (ref 0.44–1.00)
GFR, Estimated: >60 mL/min (ref >60)
Glucose, Bld: 193 mg/dL — ABNORMAL HIGH (ref 70–99)
Potassium: 3.2 mmol/L — ABNORMAL LOW (ref 3.5–5.1)
Sodium: 139 mmol/L (ref 135–145)

## 2022-01-01 LAB — HEPATIC FUNCTION PANEL
ALT: 72 U/L — ABNORMAL HIGH (ref 0–44)
AST: 25 U/L (ref 15–41)
Albumin: 3.1 g/dL — ABNORMAL LOW (ref 3.5–5.0)
Alkaline Phosphatase: 110 U/L (ref 38–126)
Bilirubin, Direct: 0.5 mg/dL — ABNORMAL HIGH (ref 0.0–0.2)
Indirect Bilirubin: 0.6 mg/dL (ref 0.3–0.9)
Total Bilirubin: 1.1 mg/dL (ref 0.3–1.2)
Total Protein: 6.9 g/dL (ref 6.5–8.1)

## 2022-01-01 LAB — GLUCOSE, CAPILLARY
Glucose-Capillary: 185 mg/dL — ABNORMAL HIGH (ref 70–99)
Glucose-Capillary: 128 mg/dL — ABNORMAL HIGH (ref 70–99)
Glucose-Capillary: 91 mg/dL (ref 70–99)
Glucose-Capillary: 132 mg/dL — ABNORMAL HIGH (ref 70–99)

## 2022-01-01 LAB — MAGNESIUM: Magnesium: 2.1 mg/dL (ref 1.7–2.4)

## 2022-01-01 LAB — PHOSPHORUS: Phosphorus: 4.2 mg/dL (ref 2.5–4.6)

## 2022-01-01 SURGERY — CHOLECYSTECTOMY, ROBOT-ASSISTED, LAPAROSCOPIC
Anesthesia: General

## 2022-01-01 MED ORDER — PHENYLEPHRINE HCL (PRESSORS) 10 MG/ML IV SOLN
INTRAVENOUS | Status: DC | PRN
  Administered 2022-01-01: 160 ug via INTRAVENOUS
  Administered 2022-01-01: 80 ug via INTRAVENOUS

## 2022-01-01 MED ORDER — MIDAZOLAM HCL 5 MG/5ML IJ SOLN
INTRAMUSCULAR | Status: DC | PRN
  Administered 2022-01-01: 2 mg via INTRAVENOUS

## 2022-01-01 MED ORDER — ROCURONIUM BROMIDE 100 MG/10ML IV SOLN
INTRAVENOUS | Status: DC | PRN
  Administered 2022-01-01 (×2): 50 mg via INTRAVENOUS

## 2022-01-01 MED ORDER — DEXAMETHASONE SODIUM PHOSPHATE 10 MG/ML IJ SOLN
INTRAMUSCULAR | Status: DC | PRN
  Administered 2022-01-01: 10 mg via INTRAVENOUS

## 2022-01-01 MED ORDER — SUGAMMADEX SODIUM 200 MG/2ML IV SOLN
INTRAVENOUS | Status: DC | PRN
  Administered 2022-01-01: 200 mg via INTRAVENOUS

## 2022-01-01 MED ORDER — PROPOFOL 10 MG/ML IV BOLUS
INTRAVENOUS | Status: AC
  Filled 2022-01-01: qty 20

## 2022-01-01 MED ORDER — CEFAZOLIN SODIUM-DEXTROSE 2-3 GM-%(50ML) IV SOLR
INTRAVENOUS | Status: DC | PRN
  Administered 2022-01-01: 2 g via INTRAVENOUS

## 2022-01-01 MED ORDER — LIDOCAINE HCL (CARDIAC) PF 100 MG/5ML IV SOSY
PREFILLED_SYRINGE | INTRAVENOUS | Status: DC | PRN
  Administered 2022-01-01: 80 mg via INTRAVENOUS

## 2022-01-01 MED ORDER — FENTANYL CITRATE (PF) 100 MCG/2ML IJ SOLN
INTRAMUSCULAR | Status: AC
  Filled 2022-01-01: qty 2

## 2022-01-01 MED ORDER — ONDANSETRON HCL 4 MG/2ML IJ SOLN: 4.0000 mg | Freq: Once | INTRAMUSCULAR | Status: DC | PRN

## 2022-01-01 MED ORDER — ROCURONIUM BROMIDE 10 MG/ML (PF) SYRINGE
PREFILLED_SYRINGE | INTRAVENOUS | Status: AC
  Filled 2022-01-01: qty 10

## 2022-01-01 MED ORDER — FENTANYL CITRATE (PF) 100 MCG/2ML IJ SOLN
INTRAMUSCULAR | Status: DC | PRN
Start: 2022-01-01 — End: 2022-01-01
  Administered 2022-01-01 (×4): 25 ug via INTRAVENOUS

## 2022-01-01 MED ORDER — MIDAZOLAM HCL 2 MG/2ML IJ SOLN
INTRAMUSCULAR | Status: AC
  Filled 2022-01-01: qty 2

## 2022-01-01 MED ORDER — FENTANYL CITRATE (PF) 100 MCG/2ML IJ SOLN
INTRAMUSCULAR | Status: AC
  Administered 2022-01-01: 25 ug via INTRAVENOUS
  Filled 2022-01-01: qty 2

## 2022-01-01 MED ORDER — "VISTASEAL 4 ML SINGLE DOSE KIT "
PACK | CUTANEOUS | Status: DC | PRN
  Administered 2022-01-01: 4 mL via TOPICAL

## 2022-01-01 MED ORDER — ONDANSETRON HCL 4 MG/2ML IJ SOLN
INTRAMUSCULAR | Status: DC | PRN
  Administered 2022-01-01: 4 mg via INTRAVENOUS

## 2022-01-01 MED ORDER — SODIUM CHLORIDE 0.9 % IV SOLN: INTRAVENOUS | Status: DC | PRN

## 2022-01-01 MED ORDER — DIPHENHYDRAMINE HCL 25 MG PO CAPS
25.0000 mg | ORAL_CAPSULE | Freq: Four times a day (QID) | ORAL | Status: DC | PRN
  Administered 2022-01-01 – 2022-01-03 (×3): 25 mg via ORAL
  Filled 2022-01-01 (×3): qty 1

## 2022-01-01 MED ORDER — FENTANYL CITRATE (PF) 100 MCG/2ML IJ SOLN
25.0000 ug | INTRAMUSCULAR | Status: DC | PRN
  Administered 2022-01-01 (×2): 25 ug via INTRAVENOUS

## 2022-01-01 MED ORDER — BUPIVACAINE HCL 0.5 % IJ SOLN
INTRAMUSCULAR | Status: DC | PRN
  Administered 2022-01-01: 20 mL

## 2022-01-01 MED ORDER — PROPOFOL 10 MG/ML IV BOLUS
INTRAVENOUS | Status: DC | PRN
  Administered 2022-01-01: 150 mg via INTRAVENOUS

## 2022-01-01 SURGICAL SUPPLY — 58 items
"PENCIL ELECTRO HAND CTR " (MISCELLANEOUS) ×1 IMPLANT
ANCHOR TIS RET SYS 235ML (MISCELLANEOUS) ×3 IMPLANT
APPLICATOR VISTASEAL 35 (MISCELLANEOUS) ×2 IMPLANT
BAG INFUSER PRESSURE 100CC (MISCELLANEOUS) ×2 IMPLANT
BLADE SURG SZ11 CARB STEEL (BLADE) ×3 IMPLANT
BULB RESERV EVAC DRAIN JP 100C (MISCELLANEOUS) ×2 IMPLANT
CANNULA REDUC XI 12-8 STAPL (CANNULA) ×1
CANNULA REDUC XI 12-8MM STAPL (CANNULA) ×1
CANNULA REDUCER 12-8 DVNC XI (CANNULA) ×1 IMPLANT
CATH REDDICK CHOLANGI 4FR 50CM (CATHETERS) IMPLANT
CLIP LIGATING HEMO O LOK GREEN (MISCELLANEOUS) ×3 IMPLANT
DERMABOND ADVANCED (GAUZE/BANDAGES/DRESSINGS) ×2
DERMABOND ADVANCED .7 DNX12 (GAUZE/BANDAGES/DRESSINGS) ×1 IMPLANT
DRAIN CHANNEL JP 15F RND 16 (MISCELLANEOUS) ×2 IMPLANT
DRAPE ARM DVNC X/XI (DISPOSABLE) ×4 IMPLANT
DRAPE C-ARM XRAY 36X54 (DRAPES) IMPLANT
DRAPE COLUMN DVNC XI (DISPOSABLE) ×1 IMPLANT
DRAPE DA VINCI XI ARM (DISPOSABLE) ×8
DRAPE DA VINCI XI COLUMN (DISPOSABLE) ×2
ELECT CAUTERY BLADE 6.4 (BLADE) ×3 IMPLANT
ELECT REM PT RETURN 9FT ADLT (ELECTROSURGICAL) ×3
ELECTRODE REM PT RTRN 9FT ADLT (ELECTROSURGICAL) ×1 IMPLANT
GLOVE SURG SYN 6.5 ES PF (GLOVE) ×6 IMPLANT
GLOVE SURG SYN 6.5 PF PI (GLOVE) ×2 IMPLANT
GLOVE SURG UNDER POLY LF SZ7 (GLOVE) ×6 IMPLANT
GOWN STRL REUS W/ TWL LRG LVL3 (GOWN DISPOSABLE) ×3 IMPLANT
GOWN STRL REUS W/TWL LRG LVL3 (GOWN DISPOSABLE) ×6
GRASPER SUT TROCAR 14GX15 (MISCELLANEOUS) IMPLANT
IRRIGATOR SUCT 8 DISP DVNC XI (IRRIGATION / IRRIGATOR) IMPLANT
IRRIGATOR SUCTION 8MM XI DISP (IRRIGATION / IRRIGATOR) ×2
IV NS 1000ML (IV SOLUTION) ×2
IV NS 1000ML BAXH (IV SOLUTION) IMPLANT
LABEL OR SOLS (LABEL) ×1 IMPLANT
LIGASURE VESSEL 5MM BLUNT TIP (ELECTROSURGICAL) ×2 IMPLANT
MANIFOLD NEPTUNE II (INSTRUMENTS) ×3 IMPLANT
NDL INSUFFLATION 14GA 120MM (NEEDLE) ×1 IMPLANT
NEEDLE HYPO 22GX1.5 SAFETY (NEEDLE) ×3 IMPLANT
NEEDLE INSUFFLATION 14GA 120MM (NEEDLE) ×3 IMPLANT
NS IRRIG 500ML POUR BTL (IV SOLUTION) ×3 IMPLANT
OBTURATOR OPTICAL STANDARD 8MM (TROCAR) ×2
OBTURATOR OPTICAL STND 8 DVNC (TROCAR) ×1
OBTURATOR OPTICALSTD 8 DVNC (TROCAR) ×1 IMPLANT
PACK LAP CHOLECYSTECTOMY (MISCELLANEOUS) ×3 IMPLANT
PENCIL ELECTRO HAND CTR (MISCELLANEOUS) ×3 IMPLANT
SEAL CANN UNIV 5-8 DVNC XI (MISCELLANEOUS) ×3 IMPLANT
SEAL XI 5MM-8MM UNIVERSAL (MISCELLANEOUS) ×6
SET TUBE SMOKE EVAC HIGH FLOW (TUBING) ×3 IMPLANT
SOLUTION ELECTROLUBE (MISCELLANEOUS) ×3 IMPLANT
SPIKE FLUID TRANSFER (MISCELLANEOUS) ×2 IMPLANT
STAPLER CANNULA SEAL DVNC XI (STAPLE) ×1 IMPLANT
STAPLER CANNULA SEAL XI (STAPLE) ×2
SUT MNCRL 4-0 (SUTURE) ×4
SUT MNCRL 4-0 27XMFL (SUTURE) ×2
SUT VICRYL 0 AB UR-6 (SUTURE) ×3 IMPLANT
SUTURE MNCRL 4-0 27XMF (SUTURE) ×2 IMPLANT
SYR 30ML LL (SYRINGE) IMPLANT
SYSTEM WECK SHIELD CLOSURE (TROCAR) IMPLANT
WATER STERILE IRR 500ML POUR (IV SOLUTION) ×3 IMPLANT

## 2022-01-01 NOTE — Anesthesia Procedure Notes (Signed)
Procedure Name: Intubation ?Date/Time: 01/01/2022 8:52 PM ?Performed by: Loree Fee, CRNA ?Pre-anesthesia Checklist: Patient identified, Patient being monitored, Timeout performed, Emergency Drugs available and Suction available ?Patient Re-evaluated:Patient Re-evaluated prior to induction ?Oxygen Delivery Method: Circle system utilized ?Preoxygenation: Pre-oxygenation with 100% oxygen ?Induction Type: IV induction ?Laryngoscope Size: Mac and 3 ?Grade View: Grade I ?Tube type: Oral ?Tube size: 7.0 mm ?Number of attempts: 1 ?Airway Equipment and Method: Stylet ?Placement Confirmation: ETT inserted through vocal cords under direct vision, positive ETCO2 and breath sounds checked- equal and bilateral ?Secured at: 22 cm ?Tube secured with: Tape ?Dental Injury: Teeth and Oropharynx as per pre-operative assessment  ? ? ? ? ?

## 2022-01-01 NOTE — Progress Notes (Signed)
Patient had on episode of green emesis, zofran was given. Pain medication including tramadol, Norco, and morphine have been given throughout shift for right sided abdominal pain.  ?

## 2022-01-01 NOTE — Plan of Care (Signed)

## 2022-01-01 NOTE — Transfer of Care (Signed)
Immediate Anesthesia Transfer of Care Note ? ?Patient: Kendra Herring ? ?Procedure(s) Performed: XI ROBOTIC ASSISTED LAPAROSCOPIC CHOLECYSTECTOMY ? ?Patient Location: PACU ? ?Anesthesia Type:General ? ?Level of Consciousness: drowsy ? ?Airway & Oxygen Therapy: Patient Spontanous Breathing and Patient connected to nasal cannula oxygen ? ?Post-op Assessment: Report given to RN and Post -op Vital signs reviewed and stable ? ?Post vital signs: stable ? ?Last Vitals:  ?Vitals Value Taken Time  ?BP 126/60 01/01/22 2254  ?Temp 36.3 ?C 01/01/22 2254  ?Pulse 86 01/01/22 2254  ?Resp 17 01/01/22 2254  ?SpO2 92 % 01/01/22 2254  ?Vitals shown include unvalidated device data. ? ?Last Pain:  ?Vitals:  ? 01/01/22 2254  ?TempSrc: Skin  ?PainSc:   ?   ? ?Patients Stated Pain Goal: 0 (01/01/22 0900) ? ?Complications: No notable events documented. ?

## 2022-01-01 NOTE — Interval H&P Note (Signed)
History and Physical Interval Note: ? ?01/01/2022 ?6:25 PM ? ?Kendra Herring  has presented today for surgery, with the diagnosis of aacute cholecystitis.  The various methods of treatment have been discussed with the patient and family. After consideration of risks, benefits and other options for treatment, the patient has consented to  Procedure(s): ?XI ROBOTIC [place] (N/A) as a surgical intervention.  The patient's history has been reviewed, patient examined, no change in status, stable for surgery.  I have reviewed the patient's chart and labs.  Questions were answered to the patient's satisfaction.   ? ? ?Kendra Herring ? ? ?

## 2022-01-01 NOTE — Anesthesia Preprocedure Evaluation (Addendum)
Anesthesia Evaluation  ?Patient identified by MRN, date of birth, ID band ?Patient awake ? ? ? ?Reviewed: ?Allergy & Precautions, H&P , NPO status , Patient's Chart, lab work & pertinent test results, reviewed documented beta blocker date and time  ? ?Airway ?Mallampati: II ? ?TM Distance: >3 FB ?Neck ROM: full ? ? ? Dental ? ?(+) Teeth Intact ?  ?Pulmonary ?neg shortness of breath, asthma ,  ?  ?Pulmonary exam normal ? ? ? ? ? ? ? Cardiovascular ?Exercise Tolerance: Good ?negative cardio ROS ? ?Atrial Fibrillation  ?Rhythm:regular Rate:Normal ? ? ?  ?Neuro/Psych ?PSYCHIATRIC DISORDERS Anxiety negative neurological ROS ?   ? GI/Hepatic ?negative GI ROS, Neg liver ROS,   ?Endo/Other  ?negative endocrine ROSdiabetes, Well Controlled, Type 2 ? Renal/GU ?negative Renal ROS  ?negative genitourinary ?  ?Musculoskeletal ? ? Abdominal ?  ?Peds ? Hematology ?negative hematology ROS ?(+)   ?Anesthesia Other Findings ?Past Medical History: ?No date: Anxiety ?No date: Arrhythmia ?No date: Arthritis ?No date: Asthma ?03/2017: Cervical cancer ([facility]) ?    Comment:  Rad, chemo and brachytherapy tx's.  ?No date: Diabetes mellitus without complication ([place]) ?69/62/9528: Hernia of abdominal wall ?No date: Personal history of chemotherapy ?No date: Personal history of radiation therapy ?    Comment:  cervical cancer ?Past Surgical History: ?No date: HIP SURGERY ?No date: TUBAL LIGATION ?BMI   ? Body Mass Index: 28.54 kg/m?  ?  ? Reproductive/Obstetrics ?negative OB ROS ? ?  ? ? ? ? ? ? ? ? ? ? ? ? ? ?  ?  ? ? ? ? ? ? ? ? ?Anesthesia Physical ?Anesthesia Plan ? ?ASA: 3 and emergent ? ?Anesthesia Plan: General ETT  ? ?Post-op Pain Management:   ? ?Induction:  ? ?PONV Risk Score and Plan: 4 or greater ? ?Airway Management Planned:  ? ?Additional Equipment:  ? ?Intra-op Plan:  ? ?Post-operative Plan:  ? ?Informed Consent: I have reviewed the patients History and Physical, chart, labs and discussed the  procedure including the risks, benefits and alternatives for the proposed anesthesia with the patient or authorized representative who has indicated his/her understanding and acceptance.  ? ? ? ?Dental Advisory Given ? ?Plan Discussed with: CRNA ? ?Anesthesia Plan Comments:   ? ? ? ? ? ? ?Anesthesia Quick Evaluation ? ?

## 2022-01-02 LAB — HEPATIC FUNCTION PANEL
ALT: 68 U/L — ABNORMAL HIGH (ref 0–44)
AST: 42 U/L — ABNORMAL HIGH (ref 15–41)
Albumin: 3.1 g/dL — ABNORMAL LOW (ref 3.5–5.0)
Alkaline Phosphatase: 106 U/L (ref 38–126)
Bilirubin, Direct: 0.4 mg/dL — ABNORMAL HIGH (ref 0.0–0.2)
Indirect Bilirubin: 0.5 mg/dL (ref 0.3–0.9)
Total Bilirubin: 0.9 mg/dL (ref 0.3–1.2)
Total Protein: 7.3 g/dL (ref 6.5–8.1)

## 2022-01-02 LAB — GLUCOSE, CAPILLARY
Glucose-Capillary: 112 mg/dL — ABNORMAL HIGH (ref 70–99)
Glucose-Capillary: 252 mg/dL — ABNORMAL HIGH (ref 70–99)
Glucose-Capillary: 274 mg/dL — ABNORMAL HIGH (ref 70–99)
Glucose-Capillary: 189 mg/dL — ABNORMAL HIGH (ref 70–99)
Glucose-Capillary: 142 mg/dL — ABNORMAL HIGH (ref 70–99)
Glucose-Capillary: 148 mg/dL — ABNORMAL HIGH (ref 70–99)

## 2022-01-02 LAB — BASIC METABOLIC PANEL
Anion gap: 12 (ref 5–15)
BUN: 16 mg/dL (ref 6–20)
CO2: 21 mmol/L — ABNORMAL LOW (ref 22–32)
Calcium: 8.6 mg/dL — ABNORMAL LOW (ref 8.9–10.3)
Chloride: 104 mmol/L (ref 98–111)
Creatinine, Ser: 0.61 mg/dL (ref 0.44–1.00)
GFR, Estimated: >60 mL/min (ref >60)
Glucose, Bld: 239 mg/dL — ABNORMAL HIGH (ref 70–99)
Potassium: 3.6 mmol/L (ref 3.5–5.1)
Sodium: 137 mmol/L (ref 135–145)

## 2022-01-02 LAB — CBC
HCT: 46.8 % — ABNORMAL HIGH (ref 36.0–46.0)
HCT: 44.5 % (ref 36.0–46.0)
Hemoglobin: 14.9 g/dL (ref 12.0–15.0)
Hemoglobin: 15 g/dL (ref 12.0–15.0)
MCH: 29.6 pg (ref 26.0–34.0)
MCH: 30.2 pg (ref 26.0–34.0)
MCHC: 31.8 g/dL (ref 30.0–36.0)
MCHC: 33.7 g/dL (ref 30.0–36.0)
MCV: 93 fL (ref 80.0–100.0)
MCV: 89.7 fL (ref 80.0–100.0)
Platelets: 231 10*3/uL (ref 150–400)
Platelets: 255 10*3/uL (ref 150–400)
RBC: 5.03 MIL/uL (ref 3.87–5.11)
RBC: 4.96 MIL/uL (ref 3.87–5.11)
RDW: 12.8 % (ref 11.5–15.5)
RDW: 12.7 % (ref 11.5–15.5)
WBC: 10.8 10*3/uL — ABNORMAL HIGH (ref 4.0–10.5)
WBC: 10 10*3/uL (ref 4.0–10.5)
nRBC: 0 % (ref 0.0–0.2)
nRBC: 0 % (ref 0.0–0.2)

## 2022-01-02 LAB — MAGNESIUM: Magnesium: 1.8 mg/dL (ref 1.7–2.4)

## 2022-01-02 LAB — HEMOGLOBIN A1C
Hgb A1c MFr Bld: 8.4 % — ABNORMAL HIGH (ref 4.8–5.6)
Mean Plasma Glucose: 194 mg/dL

## 2022-01-02 LAB — PHOSPHORUS: Phosphorus: 3.4 mg/dL (ref 2.5–4.6)

## 2022-01-02 MED ORDER — ENOXAPARIN SODIUM 40 MG/0.4ML IJ SOSY
40.0000 mg | PREFILLED_SYRINGE | INTRAMUSCULAR | Status: DC
  Administered 2022-01-02 – 2022-01-03 (×2): 40 mg via SUBCUTANEOUS
  Filled 2022-01-02 (×2): qty 0.4

## 2022-01-02 MED ORDER — KETOROLAC TROMETHAMINE 30 MG/ML IJ SOLN
30.0000 mg | Freq: Three times a day (TID) | INTRAMUSCULAR | Status: DC
  Administered 2022-01-02 – 2022-01-03 (×4): 30 mg via INTRAVENOUS
  Filled 2022-01-02 (×4): qty 1

## 2022-01-02 MED ORDER — SODIUM CHLORIDE 0.9 % IV SOLN: INTRAVENOUS | Status: DC

## 2022-01-02 MED ORDER — GABAPENTIN 100 MG PO CAPS
200.0000 mg | ORAL_CAPSULE | Freq: Two times a day (BID) | ORAL | Status: DC
Start: 2022-01-02 — End: 2022-01-03
  Administered 2022-01-02 – 2022-01-03 (×3): 200 mg via ORAL
  Filled 2022-01-02 (×3): qty 2

## 2022-01-02 MED ORDER — HYDROMORPHONE HCL 1 MG/ML IJ SOLN
0.5000 mg | INTRAMUSCULAR | Status: DC | PRN
  Administered 2022-01-02: 0.5 mg via INTRAVENOUS
  Filled 2022-01-02: qty 0.5

## 2022-01-02 NOTE — TOC Initial Note (Signed)
Transition of Care (TOC) - Initial/Assessment Note  ? ? ?Patient Details  ?Name: Kendra Herring ?MRN: [id number] ?Date of Birth: Mar 17, 1961 ? ?Transition of Care (TOC) CM/SW Contact:    ?Beverly Sessions, RN ?Phone Number: ?01/02/2022, 12:24 PM ? ?Clinical Narrative:                 ? ? ?Transition of Care (TOC) Screening Note ? ? ?Patient Details  ?Name: Kendra Herring ?Date of Birth: 05/03/1961 ? ? ?Transition of Care (TOC) CM/SW Contact:    ?Beverly Sessions, RN ?Phone Number: ?01/02/2022, 12:24 PM ? ? ? ?Transition of Care Department [facility]) has reviewed patient and no TOC needs have been identified at this time. We will continue to monitor patient advancement through interdisciplinary progression rounds. If new patient transition needs arise, please place a TOC consult. ? ? ?  ?  ? ? ?Patient Goals and CMS Choice ?  ?  ?  ? ?Expected Discharge Plan and Services ?  ?  ?  ?  ?  ?                ?  ?  ?  ?  ?  ?  ?  ?  ?  ?  ? ?Prior Living Arrangements/Services ?  ?  ?  ?       ?  ?  ?  ?  ? ?Activities of Daily Living ?Home Assistive Devices/Equipment: Gilford Rile (specify type) (rolling) ?ADL Screening (condition at time of admission) ?Patient's cognitive ability adequate to safely complete daily activities?: No ?Is the patient deaf or have difficulty hearing?: No ?Does the patient have difficulty seeing, even when wearing glasses/contacts?: No ?Does the patient have difficulty concentrating, remembering, or making decisions?: Yes ?Patient able to express need for assistance with ADLs?: Yes ?Does the patient have difficulty dressing or bathing?: No ?Independently performs ADLs?: Yes (appropriate for developmental age) ?Does the patient have difficulty walking or climbing stairs?: Yes ?Weakness of Legs: Right ?Weakness of Arms/Hands: None ? ?Permission Sought/Granted ?  ?  ?   ?   ?   ?   ? ?Emotional Assessment ?  ?  ?  ?  ?  ?  ? ?Admission diagnosis:  RUQ pain [R10.11] ?Transaminitis [R74.01] ?Patient Active Problem  List  ? Diagnosis Date Noted  ? RUQ pain 12/30/2021  ? ASCUS of cervix with negative high risk HPV 02/09/2021  ? Exertional shortness of breath 12/25/2019  ? Family history of coronary artery disease 12/25/2019  ? Strain of rotator cuff capsule 11/12/2017  ? Anxiety disorder 04/30/2017  ? 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 Cervical carcinoma ([place]) 04/06/2017  ? Hip pain 05/02/2016  ? History of total hip arthroplasty 05/02/2016  ? ?PCP:  Donnie Coffin, MD ?Pharmacy:   ?[place] ?[place] [address] ?Phone: [phone] Fax: [phone] ? ?[facility] [place] AT [facility] OF [address] ?[place] ?[place] [address] ?Phone: [phone] Fax: [phone] ? ? ? ? ?Social Determinants of Health (SDOH) Interventions ?  ? ?Readmission Risk Interventions ?No flowsheet data found. ? ? ?

## 2022-01-02 NOTE — Op Note (Signed)
Preoperative diagnosis:  biliary dyskinesia ? ?Postoperative diagnosis: chronic cholecystitis ? ?Procedure: Robotic assisted Laparoscopic Cholecystectomy.  ? ?Anesthesia: GETA ?  ?Surgeon: Benjamine Sprague ? ?Specimen: Gallbladder ? ?Complications: None ? ?EBL: 74m ? ?Wound Classification: Clean Contaminated ? ?Indications: see HPI ? ?Findings: ?Critical view of safety noted ?Cystic duct and artery identified, ligated and divided, clips remained intact at end of procedure ?Adequate hemostasis ? ?Description of procedure:  ?The patient was placed on the operating table in the supine position. SCDs placed, pre-op abx administered.  General anesthesia was induced and OG tube placed by anesthesia. A time-out was completed verifying correct patient, procedure, site, positioning, and implant(s) and/or special equipment prior to beginning this procedure. The abdomen was prepped and draped in the usual sterile fashion.  ?  ?Veress needle was placed at the Palmer's point and insufflation was started after confirming a positive saline drop test and no immediate increase in abdominal pressure.  After reaching 15 mm, the Veress needle was removed and a 8 mm port was placed via optiview technique under umbilicus measured 214NWfrom gallbladder.  The abdomen was inspected and no abnormalities or injuries were found.  However, dense adhesions of the omentum were noted surrounding the initial port placement site.  Around these adhesions under direct vision, ports were placed in the following locations: One 12 mm patient left of the umbilicus, 8cm from the optiviewed port, one 8 mm port placed to the patient right of the umbilical port 8 cm apart.  1 additional 8 mm port placed lateral to the 153mport.  Once ports were placed, LigaSure was used and the omental attachments around the periumbilical port site was then removed to allow visualization of the right upper quadrant.  Care was noted to ensure there is no injury to the attached  colon and small bowel during the adhesion removal.  The table was placed in the reverse Trendelenburg position with the right side up. The Xi platform was brought into the operative field and docked to the ports successfully.  An endoscope was placed through the umbilical port, fenestrated grasper through the adjacent patient right port, prograsp to the far patient left port, and then a hook cautery in the left port. ? ?The dome of the gallbladder was grasped with prograsp, passed and retracted over the dome of the liver.  Friable adhesions between the gallbladder and omentum, duodenum and transverse colon were lysed via hook cautery. The infundibulum was grasped with the fenestrated grasper and retracted toward the right lower quadrant. This maneuver exposed Calot?s triangle. The peritoneum overlying the gallbladder infundibulum was then dissected  and the cystic duct and cystic artery eventually identified.  Critical view of safety with the liver bed clearly visible behind the duct and artery with no additional structures noted.  The cystic duct and cystic artery clipped and divided close to the gallbladder.   ?  ?The gallbladder was then dissected from its peritoneal and liver bed attachments by electrocautery.  Gallbladder was inadvertently entered due to the very inflamed nature of the tissue, but the entire gallbladder was eventually able to be removed.  Hemostasis was checked prior to removing the hook cautery and the Endo Catch bag was then placed through the 12 mm port and the gallbladder was removed.  The gallbladder was passed off the table as a specimen. There was no evidence of bleeding from the gallbladder fossa or cystic artery or leakage of the bile from the cystic duct stump.  Previous bleeding in the  fossa was controlled with electrocautery and manual pressure.  All leaked bile was suctioned out completely until clear fluid return was noted.  Vistaseal infused to the inflamed fossa for further  hemostasis.   ? ?21 [place] round drain was then placed in the right upper quadrant through the right side port secured to the skin using 3-0 nylon.  Robot then undocked. ? ?The 12 mm port site closed with PMI using 0 vicryl under direct vision.  Abdomen desufflated and secondary trocars were removed under direct vision. No bleeding was noted. All skin incisions then closed with subcuticular sutures of 4-0 monocryl and dressed with topical skin adhesive. The orogastric tube was removed and patient extubated. ? ?The patient tolerated the procedure well and was taken to the postanesthesia care unit in stable condition.  All sponge and instrument count correct at end of procedure. ? ?

## 2022-01-02 NOTE — Progress Notes (Signed)
Subjective:  ?CC: ?Kendra Herring is a 61 y.o. female  Hospital stay day 1, 1 Day Post-Op robot lap chole ? ?HPI: ?Complains of persistent RUQ pain today.  No change in level with eating regular diet.  No BM yet.  Denies nausea/vomiting ? ?ROS:  ?General: Denies weight loss, weight gain, fatigue, fevers, chills, and night sweats. ?Heart: Denies chest pain, palpitations, racing heart, irregular heartbeat, leg pain or swelling, and decreased activity tolerance. ?Respiratory: Denies breathing difficulty, shortness of breath, wheezing, cough, and sputum. ?GI: Denies change in appetite, heartburn, nausea, vomiting, constipation, diarrhea, and blood in stool. ?GU: Denies difficulty urinating, pain with urinating, urgency, frequency, blood in urine. ? ? ?Objective:  ? ?Temp:  [97.4 ?F (36.3 ?C)-98.7 ?F (37.1 ?C)] 97.6 ?F (36.4 ?C) (03/13 1544) ?Pulse Rate:  [78-93] 78 (03/13 1544) ?Resp:  [13-19] 18 (03/13 1544) ?BP: (120-156)/(60-81) 122/62 (03/13 1544) ?SpO2:  [92 %-99 %] 96 % (03/13 1544)     Height: '4\' 11"'$  (149.9 cm) Weight: 64.1 kg BMI (Calculated): 28.53  ? ?Intake/Output this shift:  ? ?Intake/Output Summary (Last 24 hours) at 01/02/2022 1625 ?Last data filed at 01/02/2022 1046 ?Gross per 24 hour  ?Intake 1040 ml  ?Output 100 ml  ?Net 940 ml  ? ? ?Constitutional :  alert, cooperative, appears stated age, and no distress  ?Respiratory:  clear to auscultation bilaterally  ?Cardiovascular:  regular rate and rhythm  ?Gastrointestinal: voluntary guarding in all four quadrants, with worse TTP in RUQ, but minimal pain in other quadrants  ?Skin: Cool and moist.   ?Psychiatric: Normal affect, non-agitated, not confused  ?   ?  ?LABS:  ?CMP Latest Ref Rng & Units 01/02/2022 01/01/2022 12/31/2021  ?Glucose 70 - 99 mg/dL 239(H) 193(H) 149(H)  ?BUN 6 - 20 mg/dL 16 22(H) 24(H)  ?Creatinine 0.44 - 1.00 mg/dL 0.61 0.58 0.57  ?Sodium 135 - 145 mmol/L 137 139 140  ?Potassium 3.5 - 5.1 mmol/L 3.6 3.2(L) 3.4(L)  ?Chloride 98 - 111 mmol/L  104 106 106  ?CO2 22 - 32 mmol/L 21(L) 25 25  ?Calcium 8.9 - 10.3 mg/dL 8.6(L) 9.1 9.0  ?Total Protein 6.5 - 8.1 g/dL 7.3 6.9 7.0  ?Total Bilirubin 0.3 - 1.2 mg/dL 0.9 1.1 1.4(H)  ?Alkaline Phos 38 - 126 U/L 106 110 113  ?AST 15 - 41 U/L 42(H) 25 49(H)  ?ALT 0 - 44 U/L 68(H) 72(H) 94(H)  ? ?CBC Latest Ref Rng & Units 01/02/2022 01/02/2022 01/01/2022  ?WBC 4.0 - 10.5 K/uL 10.0 10.8(H) 4.7  ?Hemoglobin 12.0 - 15.0 g/dL 15.0 14.9 14.1  ?Hematocrit 36.0 - 46.0 % 44.5 46.8(H) 42.5  ?Platelets 150 - 400 K/uL 255 231 199  ? ? ?RADS: ? ?Assessment:  ? ?RUQ pain.  Biliary dyskinesia ? ?S/p robo lap chole ? ?Pain more than expected post op, despite a difficult lap chole, but repeat labs, vitals remain reassuring, and JP drain continues with old bloody discharge, not active.  Will continue to adjust pain meds and monitor for now.  Back to NPO in case food was cause of increased pain today. ? ? ? ?The patient understands the risks, any and all questions were answered to the patient's satisfaction. ? ? ? ?labs/images/medications/previous chart entries reviewed personally and relevant changes/updates noted above. ? ? ?

## 2022-01-03 LAB — BASIC METABOLIC PANEL
Anion gap: 6 (ref 5–15)
BUN: 16 mg/dL (ref 6–20)
CO2: 26 mmol/L (ref 22–32)
Calcium: 8.5 mg/dL — ABNORMAL LOW (ref 8.9–10.3)
Chloride: 108 mmol/L (ref 98–111)
Creatinine, Ser: 0.55 mg/dL (ref 0.44–1.00)
GFR, Estimated: >60 mL/min (ref >60)
Glucose, Bld: 182 mg/dL — ABNORMAL HIGH (ref 70–99)
Potassium: 3.1 mmol/L — ABNORMAL LOW (ref 3.5–5.1)
Sodium: 140 mmol/L (ref 135–145)

## 2022-01-03 LAB — HEPATIC FUNCTION PANEL
ALT: 43 U/L (ref 0–44)
AST: 22 U/L (ref 15–41)
Albumin: 2.7 g/dL — ABNORMAL LOW (ref 3.5–5.0)
Alkaline Phosphatase: 78 U/L (ref 38–126)
Bilirubin, Direct: 0.3 mg/dL — ABNORMAL HIGH (ref 0.0–0.2)
Indirect Bilirubin: 0.6 mg/dL (ref 0.3–0.9)
Total Bilirubin: 0.9 mg/dL (ref 0.3–1.2)
Total Protein: 6 g/dL — ABNORMAL LOW (ref 6.5–8.1)

## 2022-01-03 LAB — CBC
HCT: 37.9 % (ref 36.0–46.0)
Hemoglobin: 12.5 g/dL (ref 12.0–15.0)
MCH: 30.3 pg (ref 26.0–34.0)
MCHC: 33 g/dL (ref 30.0–36.0)
MCV: 92 fL (ref 80.0–100.0)
Platelets: 203 10*3/uL (ref 150–400)
RBC: 4.12 MIL/uL (ref 3.87–5.11)
RDW: 13.1 % (ref 11.5–15.5)
WBC: 7.8 10*3/uL (ref 4.0–10.5)
nRBC: 0 % (ref 0.0–0.2)

## 2022-01-03 LAB — GLUCOSE, CAPILLARY
Glucose-Capillary: 143 mg/dL — ABNORMAL HIGH (ref 70–99)
Glucose-Capillary: 196 mg/dL — ABNORMAL HIGH (ref 70–99)

## 2022-01-03 LAB — HEMOGLOBIN AND HEMATOCRIT, BLOOD
HCT: 41.1 % (ref 36.0–46.0)
Hemoglobin: 13.2 g/dL (ref 12.0–15.0)

## 2022-01-03 LAB — SURGICAL PATHOLOGY

## 2022-01-03 LAB — PHOSPHORUS: Phosphorus: 2.4 mg/dL — ABNORMAL LOW (ref 2.5–4.6)

## 2022-01-03 LAB — MAGNESIUM: Magnesium: 1.8 mg/dL (ref 1.7–2.4)

## 2022-01-03 MED ORDER — K PHOS MONO-SOD PHOS DI & MONO 155-852-130 MG PO TABS
500.0000 mg | ORAL_TABLET | ORAL | Status: AC
  Administered 2022-01-03: 500 mg via ORAL
  Filled 2022-01-03: qty 2

## 2022-01-03 MED ORDER — ACETAMINOPHEN 325 MG PO TABS: 650.0000 mg | ORAL_TABLET | Freq: Three times a day (TID) | ORAL | 0 refills | Status: AC | PRN

## 2022-01-03 MED ORDER — IBUPROFEN 800 MG PO TABS: 800.0000 mg | ORAL_TABLET | Freq: Three times a day (TID) | ORAL | 0 refills | Status: DC | PRN

## 2022-01-03 MED ORDER — K PHOS MONO-SOD PHOS DI & MONO 155-852-130 MG PO TABS
500.0000 mg | ORAL_TABLET | Freq: Once | ORAL | Status: DC
  Filled 2022-01-03: qty 2

## 2022-01-03 MED ORDER — DOCUSATE SODIUM 100 MG PO CAPS: 100.0000 mg | ORAL_CAPSULE | Freq: Two times a day (BID) | ORAL | 0 refills | Status: AC | PRN

## 2022-01-03 MED ORDER — HYDROCODONE-ACETAMINOPHEN 5-325 MG PO TABS: 1.0000 | ORAL_TABLET | Freq: Four times a day (QID) | ORAL | 0 refills | Status: DC | PRN

## 2022-01-03 NOTE — Progress Notes (Signed)
Discharge instructions reviewed with patient including followup visits and new medications.  Understanding was verbalized and all questions were answered.  IV removed without complication; patient tolerated well.  Patient discharged home via wheelchair in stable condition escorted by volunteer staff.  

## 2022-01-03 NOTE — Discharge Instructions (Signed)
Laparoscopic Cholecystectomy, Care After This sheet gives you information about how to care for yourself after your procedure. Your doctor may also give you more specific instructions. If you have problems or questions, contact your doctor. Follow these instructions at home: Care for cuts from surgery (incisions)  Follow instructions from your doctor about how to take care of your cuts from surgery. Make sure you: Wash your hands with soap and water before you change your bandage (dressing). If you cannot use soap and water, use hand sanitizer. Change your bandage as told by your doctor. Leave stitches (sutures), skin glue, or skin tape (adhesive) strips in place. They may need to stay in place for 2 weeks or longer. If tape strips get loose and curl up, you may trim the loose edges. Do not remove tape strips completely unless your doctor says it is okay. Do not take baths, swim, or use a hot tub until your doctor says it is okay. OK TO SHOWER 24HRS AFTER YOUR SURGERY.  Check your surgical cut area every day for signs of infection. Check for: More redness, swelling, or pain. More fluid or blood. Warmth. Pus or a bad smell. Activity Do not drive or use heavy machinery while taking prescription pain medicine. Do not play contact sports until your doctor says it is okay. Do not drive for 24 hours if you were given a medicine to help you relax (sedative). Rest as needed. Do not return to work or school until your doctor says it is okay. General instructions  tylenol and advil as needed for discomfort.  Please alternate between the two every four hours as needed for pain.    Use narcotics, if prescribed, only when tylenol and motrin is not enough to control pain.  325-650mg every 8hrs to max of 3000mg/24hrs (including the 325mg in every norco dose) for the tylenol.    Advil up to 800mg per dose every 8hrs as needed for pain.   To prevent or treat constipation while you are taking prescription  pain medicine, your doctor may recommend that you: Drink enough fluid to keep your pee (urine) clear or pale yellow. Take over-the-counter or prescription medicines. Eat foods that are high in fiber, such as fresh fruits and vegetables, whole grains, and beans. Limit foods that are high in fat and processed sugars, such as fried and sweet foods. Contact a doctor if: You develop a rash. You have more redness, swelling, or pain around your surgical cuts. You have more fluid or blood coming from your surgical cuts. Your surgical cuts feel warm to the touch. You have pus or a bad smell coming from your surgical cuts. You have a fever. One or more of your surgical cuts breaks open. You have trouble breathing. You have chest pain. You have pain that is getting worse in your shoulders. You faint or feel dizzy when you stand. You have very bad pain in your belly (abdomen). You are sick to your stomach (nauseous) for more than one day. You have throwing up (vomiting) that lasts for more than one day. You have leg pain. This information is not intended to replace advice given to you by your health care provider. Make sure you discuss any questions you have with your health care provider. Document Released: 07/18/2008 Document Revised: 04/29/2016 Document Reviewed: 03/27/2016 Elsevier Interactive Patient Education  2019 [employer]vier Inc.  

## 2022-01-04 NOTE — Discharge Summary (Signed)
Physician Discharge Summary  ?Patient ID: ?Kendra Herring ?MRN: [id number] ?DOB/AGE: Nov 27, 1960 61 y.o. ? ?Admit date: 12/30/2021 ?Discharge date: 01/03/22 ? ?Admission Diagnoses: RUQ pain ? ?Discharge Diagnoses:  ?Acute cholecystitis ? ?Discharged Condition: good ? ?Hospital Course: admitted for above. Underwent surgery.  Please see op note for details.  Post op, some pain control issues with drop in hgb noted.  Pain eventually controlled with repeat H&H stablizing. At time of d/c, tolerating diet and pain controlled, scant serous output from JP ? ?Consults: None ? ?Discharge Exam: ?Blood pressure (!) 111/55, pulse 87, temperature 98.1 ?F (36.7 ?C), resp. rate 18, height '4\' 11"'$  (1.499 m), weight 64.1 kg, SpO2 96 %. ?General appearance: alert, cooperative, and no distress ?GI: soft, no guarding, focal TTP in RUQ as expected with scant JP serous output ? ?Disposition:  ?Discharge disposition: 01-Home or Self Care ? ? ? ? ? ? ? ?Allergies as of 01/03/2022   ? ?   Reactions  ? Pseudoephedrine Palpitations  ? ?  ? ?  ?Medication List  ?  ? ?STOP taking these medications   ? ?Albuterol Sulfate 108 (90 Base) MCG/ACT Aepb ?Commonly known as: PROAIR RESPICLICK ?  ?benzonatate 100 MG capsule ?Commonly known as: TESSALON ?  ?Cholecalciferol 1.25 MG (50000 UT) capsule ?  ?escitalopram 10 MG tablet ?Commonly known as: LEXAPRO ?  ?Flovent HFA 220 MCG/ACT inhaler ?Generic drug: fluticasone ?  ?meloxicam 15 MG tablet ?Commonly known as: MOBIC ?  ?montelukast 10 MG tablet ?Commonly known as: SINGULAIR ?  ?Olopatadine HCl 0.2 % Soln ?  ?sertraline 25 MG tablet ?Commonly known as: ZOLOFT ?  ?traZODone 50 MG tablet ?Commonly known as: DESYREL ?  ?venlafaxine XR 37.5 MG 24 hr capsule ?Commonly known as: EFFEXOR-XR ?  ? ?  ? ?TAKE these medications   ? ?acetaminophen 325 MG tablet ?Commonly known as: Tylenol ?Take 2 tablets (650 mg total) by mouth every 8 (eight) hours as needed for mild pain. ?  ?dicyclomine 20 MG tablet ?Commonly known as:  BENTYL ?Take 1 tablet (20 mg total) by mouth every 6 (six) hours. ?  ?docusate sodium 100 MG capsule ?Commonly known as: Colace ?Take 1 capsule (100 mg total) by mouth 2 (two) times daily as needed for up to 10 days for mild constipation. ?  ?empagliflozin 25 MG Tabs tablet ?Commonly known as: JARDIANCE ?Take 25 mg by mouth daily. ?  ?HYDROcodone-acetaminophen 5-325 MG tablet ?Commonly known as: Norco ?Take 1 tablet by mouth every 6 (six) hours as needed for up to 6 doses for moderate pain. ?  ?ibuprofen 800 MG tablet ?Commonly known as: ADVIL ?Take 1 tablet (800 mg total) by mouth every 8 (eight) hours as needed for mild pain or moderate pain. ?  ?lactulose 10 GM/15ML solution ?Commonly known as: [place] ?Take 30 mLs (20 g total) by mouth daily as needed for mild constipation. ?  ?ondansetron 4 MG disintegrating tablet ?Commonly known as: ZOFRAN-ODT ?Take 1 tablet (4 mg total) by mouth every 8 (eight) hours as needed for nausea or vomiting. ?  ?Victoza 18 MG/3ML Sopn ?Generic drug: liraglutide ?Inject 0.6 mg into the skin at bedtime. ?  ? ?  ? ? Follow-up Information   ? ? Lysle Pearl, Meghen Akopyan, DO Follow up on 01/10/2022.   ?Specialty: Surgery ?Why: post op lap chole, drain removal. Go at 9:00am. ?Contact information: ?[address] ?[place] [address] ?[phone] ? ? ?  ?  ? ?  ?  ? ?  ? ? ? ?Total  time spent arranging discharge was >32mn. ?Signed: ?IBenjamine Sprague?01/04/2022, 8:08 AM ? ?

## 2022-01-04 NOTE — Anesthesia Postprocedure Evaluation (Signed)
Anesthesia Post Note ? ?Patient: Kendra Herring ? ?Procedure(s) Performed: XI ROBOTIC ASSISTED LAPAROSCOPIC CHOLECYSTECTOMY ? ?Patient location during evaluation: PACU ?Anesthesia Type: General ?Level of consciousness: awake and alert ?Pain management: pain level controlled ?Vital Signs Assessment: post-procedure vital signs reviewed and stable ?Respiratory status: spontaneous breathing, nonlabored ventilation, respiratory function stable and patient connected to nasal cannula oxygen ?Cardiovascular status: blood pressure returned to baseline and stable ?Postop Assessment: no apparent nausea or vomiting ?Anesthetic complications: no ? ? ?No notable events documented. ? ? ?Last Vitals:  ?Vitals:  ? 01/03/22 0746 01/03/22 1451  ?BP: 113/62 (!) 111/55  ?Pulse: 75 87  ?Resp: 18 18  ?Temp: 36.4 ?C 36.7 ?C  ?SpO2: 96% 96%  ?  ?Last Pain:  ?Vitals:  ? 01/03/22 1346  ?TempSrc:   ?PainSc: 6   ? ? ?  ?  ?  ?  ?  ?  ? ?[name] ? ? ? ? ?

## 2022-02-01 ENCOUNTER — Other Ambulatory Visit: Payer: Self-pay | Admitting: Family Medicine

## 2022-02-01 DIAGNOSIS — Z1231 Encounter for screening mammogram for malignant neoplasm of breast: Secondary | ICD-10-CM

## 2022-02-02 ENCOUNTER — Ambulatory Visit
Admission: RE | Admit: 2022-02-02 | Discharge: 2022-02-02 | Disposition: A | Discharge status: Home / Self Care | Payer: Medicare Other | Source: Ambulatory Visit | Attending: Family Medicine | Admitting: Family Medicine

## 2022-02-02 DIAGNOSIS — Z1231 Encounter for screening mammogram for malignant neoplasm of breast: Secondary | ICD-10-CM | POA: Diagnosis present

## 2022-03-08 ENCOUNTER — Inpatient Hospital Stay: Payer: Medicare Other | Attending: Obstetrics and Gynecology | Admitting: Obstetrics and Gynecology

## 2022-03-08 VITALS — BP 147/77 | HR 81 | Temp 97.6°F | Resp 18 | Wt 145.2 lb

## 2022-03-08 DIAGNOSIS — N939 Abnormal uterine and vaginal bleeding, unspecified: Secondary | ICD-10-CM | POA: Diagnosis not present

## 2022-03-08 DIAGNOSIS — Z923 Personal history of irradiation: Secondary | ICD-10-CM | POA: Diagnosis not present

## 2022-03-08 DIAGNOSIS — Z780 Asymptomatic menopausal state: Secondary | ICD-10-CM | POA: Insufficient documentation

## 2022-03-08 DIAGNOSIS — Z8541 Personal history of malignant neoplasm of cervix uteri: Secondary | ICD-10-CM | POA: Diagnosis present

## 2022-03-08 DIAGNOSIS — R059 Cough, unspecified: Secondary | ICD-10-CM | POA: Insufficient documentation

## 2022-03-08 DIAGNOSIS — Z9221 Personal history of antineoplastic chemotherapy: Secondary | ICD-10-CM | POA: Diagnosis not present

## 2022-03-08 DIAGNOSIS — Z08 Encounter for follow-up examination after completed treatment for malignant neoplasm: Secondary | ICD-10-CM | POA: Diagnosis present

## 2022-03-08 NOTE — Progress Notes (Signed)
Gynecologic Oncology Interval Visit  ? ?Referring Provider: Gae Dry, MD  ? ?Chief Concern: cervical cancer ? ?Subjective:  ?Kendra Herring is a 61 y.o. female, initially seen in consultation from Dr. Kenton Kingfisher for invasive cervical cancer, s/p chemo-radiation followed by brachytherapy completed 07/2017, returns to clinic for surveillance.  ? ?She was seen in November 2022 for surveillance and was NED at that time. In the interim she developed cholecystitis and underwent robotic surgery with Dr. Lysle Pearl. Mammogram in April 2023 was negative.  ? ?Last Pap ASCUS and HRHPV negative. Underwent colposcopy d/t bleeding. Biopsy was obtained 02/09/21 which was negative.  ? ?No bleeding, pain, or changes in bowel movements. She does c/o fatigue, cough x 1 month which she attributes to allergies, diarrhea, and intermittent vaginal spotting.  ? ? ?Oncology history ?Kendra Herring is a pleasant postmenopausal [phone] who presented to Dr. Kenton Kingfisher evaluation and management of abnormal cervical cytology, AGUS, favor high risk cells.  Pt also had abnormal exam appearance of cervix on exam.    ?Colposcopy performed with 4% acetic acid as well as EMBx. Exam notable for the following: Aceto-white Lesions Location(s): throughout, abnormal appearance, contact bleeding ? ?Pathology- 03/28/2017:         ?Part A: CERVIX, 6:00, BIOPSY: INVASIVE SQUAMOUS CELL CARCINOMA.  ?Part B: CERVIX, 10:00, BIOPSY: HIGH-GRADE SQUAMOUS INTRAEPITHELIAL LESION (CIN 3). CHRONIC CERVICITIS WITH SQUAMOUS METAPLASIA.  ?Part C: CERVIX, 12:00, BIOPSY: FOCAL HIGH-GRADE SQUAMOUS INTRAEPITHELIAL LESION (CIN 3). CHRONIC CERVICITIS.  ?Part D: ENDOCERVIX, CURETTINGS: INVASIVE SQUAMOUS CELL CARCINOMA. FRAGMENTED BIOPSY SPECIMEN.  ? ?ENDOMETRIUM, BIOPSY:  ?SCANT FRAGMENTS OF INACTIVE ENDOMETRIUM, MIXED WITH MINUTE FRAGMENTS OF  ?BENIGN ENDOCERVICAL GLANDS,  MUCUS, AND BLOOD. NO HYPERPLASIA OR  ?CARCINOMA.  ? ?She was seen in the ER 03/28/2017 for back pain. Workup included   ? ?CT scan - dedicated renal protocol: ?1. Negative for nephrolithiasis, hydronephrosis or ureteral stone. ?2. There are no acute abnormalities visualized. ? ?Dr Theora Gianotti examined patient and felt it was difficult to feel the tumor and get a sense of its size because it is inside the canal.  And she was concerned about her back pain possibly being due to metastatic disease.  ? ?PET/CT 04/16/17 ?IMPRESSION:  I reviewed images with the radiologist on the phone. ?1. Small focus of abnormal accentuated activity along the posterior cervix, potentially a site of focal neoplastic disease/malignancy. No pathologic adenopathy or evidence of metastatic spread. ?2. Right total hip prosthesis noted with cerclage wires along the stem component. ?3. Lumbar spondylosis and degenerative disc disease with potential impingement at L3-4 and L4-5. ? ? MRI ?IMPRESSION: ?Image degradation by artifact from right hip prosthesis noted. ? ?Subtle ill-defined 1.9 cm mass in the posterior wall of the cervix, consistent with known cervical carcinoma. ? ?No evidence of extra-cervical tumor extension or pelvic lymphadenopathy. ? ?She was [facility] at [facility] for consultation and stage was felt to be IIA due to extension of the tumor into posterior vagina.  Decision was made to treat her with chemoradiation and see received weekly cisplatin (05/09/2017-06/06/2017; 5 cycles) and external radiation (05/08/2017-06/21/2017) followed by brachytherapy (07/04/2017-07/24/2017) with complete response. ? ?CT scan 8/20.   ?IMPRESSION: ?1. No evidence of metastatic cervical carcinoma. ?2. Stable small enhancing lesion RIGHT hepatic lobe is favored benign. ?3. Small external iliac lymph nodes are stable and not pathologic by size criteria. ? ?She had a surveillance visit on 01/05/2021 and Pap ASCUS and HRHPV negative. Dr. Fransisca Connors recommended colposcopy b/c she had bleeding. This was thought to be  secondary to atrophic vaginal tissue and post brachytherapy. If symptoms persist  he recommended CT to evaluate further.  ? ?She saw Dr. Theora Gianotti 02/09/21 with colposcopy. Biopsy obtained:  ?DIAGNOSIS:  ?A.  VAGINA; BIOPSY:  ?- SCLEROTIC FIBROUS TISSUE, INTACT EPITHELIUM NOT IDENTIFIED.  ?- DEEPER SECTIONS EXAMINED.  ?- NEGATIVE FOR MALIGNANCY. ? ? ? ?Problem List: ?Patient Active Problem List  ? Diagnosis Date Noted  ? Encounter for follow-up surveillance of cervical cancer 03/08/2022  ? Abnormal vaginal bleeding 03/08/2022  ? RUQ pain 12/30/2021  ? ASCUS of cervix with negative high risk HPV 02/09/2021  ? Exertional shortness of breath 12/25/2019  ? Family history of coronary artery disease 12/25/2019  ? Strain of rotator cuff capsule 11/12/2017  ? Anxiety disorder 04/30/2017  ? Squamous cell carcinoma of cervix ([place]) 04/30/2017  ? Cervical carcinoma ([place]) 04/06/2017  ? Hip pain 05/02/2016  ? History of total hip arthroplasty 05/02/2016  ? ? ?Past Medical History: ?Past Medical History:  ?Diagnosis Date  ? Anxiety   ? Arrhythmia   ? Arthritis   ? Asthma   ? Cervical cancer ([place]) 03/2017  ? Rad, chemo and brachytherapy tx's.   ? Diabetes mellitus without complication ([place])   ? Hernia of abdominal wall 04/17/2017  ? Personal history of chemotherapy   ? Personal history of radiation therapy   ? cervical cancer  ? ? ?Past Surgical History: ?Past Surgical History:  ?Procedure Laterality Date  ? HIP SURGERY    ? TUBAL LIGATION    ? ? ?Past Gynecologic History:  ?Menarche: 46 ?Menstrual details: postmenopausal ?Menses regular: no ?Last Menstrual Period: a year ago ?History of Abnormal pap: yes as per HPI ?Last pap: see HPI ?Contraception: bilateral tubal ligation ? ? ?OB History:  ?OB History  ?Gravida Para Term Preterm AB Living  ?'3 3 3     3  '$ ?SAB IAB Ectopic Multiple Live Births  ?           ?  ?# Outcome Date GA Lbr Len/2nd Weight Sex Delivery Anes PTL Lv  ?3 Term 08/05/89          ?2 Term 09/09/79          ?1 Term 10/16/69          ? ? ?Family History: ?Family History  ?Problem Relation Age of Onset   ? Diabetes Mother   ? Breast cancer Neg Hx   ? ? ?Social History: ?Social History  ? ?Socioeconomic History  ? Marital status: Divorced  ?  Spouse name: Not on file  ? Number of children: Not on file  ? Years of education: Not on file  ? Highest education level: Not on file  ?Occupational History  ? Not on file  ?Tobacco Use  ? Smoking status: Never  ? Smokeless tobacco: Never  ?Vaping Use  ? Vaping Use: Never used  ?Substance and Sexual Activity  ? Alcohol use: No  ? Drug use: No  ? Sexual activity: Not Currently  ?  Birth control/protection: None  ?Other Topics Concern  ? Not on file  ?Social History Narrative  ? Not on file  ? ?Social Determinants of Health  ? ?Financial Resource Strain: Not on file  ?Food Insecurity: Not on file  ?Transportation Needs: Not on file  ?Physical Activity: Not on file  ?Stress: Not on file  ?Social Connections: Not on file  ?Intimate Partner Violence: Not on file  ? ? ?Allergies: ?Allergies  ?Allergen Reactions  ? Pseudoephedrine Palpitations  ? ? ?  Current Medications: ?Current Outpatient Medications  ?Medication Sig Dispense Refill  ? albuterol (VENTOLIN HFA) 108 (90 Base) MCG/ACT inhaler SMARTSIG:2 Puff(s) By Mouth Every 4-6 Hours PRN    ? atorvastatin (LIPITOR) 20 MG tablet TAKE 1 TABLET BY MOUTH AT BEDTIME FOR HEART PROTECTION AND CHOLESTEROL    ? dicyclomine (BENTYL) 20 MG tablet Take 1 tablet (20 mg total) by mouth every 6 (six) hours. 20 tablet 0  ? empagliflozin (JARDIANCE) 25 MG TABS tablet Take 25 mg by mouth daily.    ? guaiFENesin-codeine 100-10 MG/5ML syrup Take by mouth.    ? HYDROcodone-acetaminophen (NORCO) 5-325 MG tablet Take 1 tablet by mouth every 6 (six) hours as needed for up to 6 doses for moderate pain. 6 tablet 0  ? VICTOZA 18 MG/3ML SOPN Inject 0.6 mg into the skin at bedtime.    ? ?No current facility-administered medications for this visit.  ? ?Review of Systems ?General:  fatigue o/w no complaints ?Skin: no complaints ?Eyes: no complaints ?HEENT: no  complaints ?Breasts: no complaints ?Pulmonary: cough o/w no complaints ?Cardiac: no complaints ?Gastrointestinal: diarrhea o/w no complaints ?Genitourinary/Sexual: no complaints ?Ob/Gyn: vaginal bleedin

## 2022-03-08 NOTE — Progress Notes (Signed)
Patient here today for follow up regarding cervical cancer. Patient reports she had gallbladder surgery on 3/15, still has abdominal pain. Patient reports intermittent vaginal bleeding.  ?

## 2022-03-15 LAB — IGP, APTIMA HPV: HPV Aptima: NEGATIVE

## 2022-03-16 ENCOUNTER — Telehealth: Payer: Self-pay

## 2022-03-16 NOTE — Telephone Encounter (Signed)
Voicemail left with Kendra Herring that she may view the results of her pap smear in her MyChart or she may return call.

## 2022-03-17 ENCOUNTER — Other Ambulatory Visit: Payer: Self-pay

## 2022-03-17 DIAGNOSIS — R059 Cough, unspecified: Secondary | ICD-10-CM

## 2022-03-17 NOTE — Progress Notes (Signed)
Called Kendra Herring with results of pap smear. She reports she is still having cough. Per Dr. Theora Gianotti she should have a CXR if cough persists. Orders placed. She states she will go Tuesday for the CXR at the medical mall at [facility].

## 2022-03-21 ENCOUNTER — Ambulatory Visit
Admission: RE | Admit: 2022-03-21 | Discharge: 2022-03-21 | Disposition: A | Discharge status: Home / Self Care | Payer: Medicare Other | Source: Ambulatory Visit | Attending: Nurse Practitioner | Admitting: Nurse Practitioner

## 2022-03-21 ENCOUNTER — Ambulatory Visit
Admission: RE | Admit: 2022-03-21 | Discharge: 2022-03-21 | Disposition: A | Discharge status: Home / Self Care | Payer: Medicare Other | Attending: Nurse Practitioner | Admitting: Nurse Practitioner

## 2022-03-21 DIAGNOSIS — R059 Cough, unspecified: Secondary | ICD-10-CM

## 2022-03-22 ENCOUNTER — Telehealth: Payer: Self-pay

## 2022-03-22 NOTE — Telephone Encounter (Signed)
Called results of CXR to Ms. Dupler.   IMPRESSION: No active cardiopulmonary disease.  She was encouraged to follow up with her PCP regarding her cough.

## 2022-07-24 ENCOUNTER — Encounter: Payer: Self-pay | Admitting: Family Medicine

## 2022-09-06 ENCOUNTER — Encounter: Payer: Self-pay | Admitting: Nurse Practitioner

## 2022-09-06 ENCOUNTER — Inpatient Hospital Stay: Payer: Medicare Other | Attending: Obstetrics and Gynecology | Admitting: Nurse Practitioner

## 2022-09-06 VITALS — BP 145/88 | HR 84 | Temp 96.5°F | Resp 20 | Wt 142.6 lb

## 2022-09-06 DIAGNOSIS — Z79899 Other long term (current) drug therapy: Secondary | ICD-10-CM | POA: Diagnosis not present

## 2022-09-06 DIAGNOSIS — Z7984 Long term (current) use of oral hypoglycemic drugs: Secondary | ICD-10-CM | POA: Insufficient documentation

## 2022-09-06 DIAGNOSIS — N951 Menopausal and female climacteric states: Secondary | ICD-10-CM | POA: Diagnosis not present

## 2022-09-06 DIAGNOSIS — Z7985 Long-term (current) use of injectable non-insulin antidiabetic drugs: Secondary | ICD-10-CM | POA: Diagnosis not present

## 2022-09-06 DIAGNOSIS — Z9221 Personal history of antineoplastic chemotherapy: Secondary | ICD-10-CM | POA: Diagnosis not present

## 2022-09-06 DIAGNOSIS — Z8541 Personal history of malignant neoplasm of cervix uteri: Secondary | ICD-10-CM

## 2022-09-06 DIAGNOSIS — Z08 Encounter for follow-up examination after completed treatment for malignant neoplasm: Secondary | ICD-10-CM

## 2022-09-06 DIAGNOSIS — Z923 Personal history of irradiation: Secondary | ICD-10-CM | POA: Insufficient documentation

## 2022-09-06 DIAGNOSIS — E119 Type 2 diabetes mellitus without complications: Secondary | ICD-10-CM | POA: Diagnosis not present

## 2022-09-06 MED ORDER — VENLAFAXINE HCL ER 37.5 MG PO CP24: ORAL_CAPSULE | ORAL | 0 refills | Status: DC

## 2022-09-06 NOTE — Progress Notes (Signed)
Gynecologic Oncology Interval Visit   Referring Provider: Gae Dry, MD   Chief Concern: cervical cancer  Subjective:  Kendra Herring is a 61 y.o. female, initially seen in consultation from Dr. Kenton Kingfisher for invasive cervical cancer, s/p chemo-radiation followed by brachytherapy completed 07/2017, returns to clinic for surveillance.   She saw Dr Theora Gianotti May 2023 and was NED at that time. She denies additional spotting. Complains of hot flashes and mood swings. No pelvic pain, changes in bowel movements. Is fatigued which is unchanged.   Mammogram in April 2023 was negative.   Pap history:  03/08/22- NILM, HPV negative 01/05/21- ASCUS, HPV Negative. Colpo & biopsy on 02/09/21 which was benign.  06/04/19- NILM, HPV Negative 02/06/17- Abnormal- ASGUS favoring high grade dysplasia- biopsy consistent with SCC of cervix   Oncology history Kendra Herring is a pleasant postmenopausal [phone] who presented to Dr. Kenton Kingfisher evaluation and management of abnormal cervical cytology, AGUS, favor high risk cells.  Pt also had abnormal exam appearance of cervix on exam.    Colposcopy performed with 4% acetic acid as well as EMBx. Exam notable for the following: Aceto-white Lesions Location(s): throughout, abnormal appearance, contact bleeding  Pathology- 03/28/2017:         Part A: CERVIX, 6:00, BIOPSY: INVASIVE SQUAMOUS CELL CARCINOMA.  Part B: CERVIX, 10:00, BIOPSY: HIGH-GRADE SQUAMOUS INTRAEPITHELIAL LESION (CIN 3). CHRONIC CERVICITIS WITH SQUAMOUS METAPLASIA.  Part C: CERVIX, 12:00, BIOPSY: FOCAL HIGH-GRADE SQUAMOUS INTRAEPITHELIAL LESION (CIN 3). CHRONIC CERVICITIS.  Part D: ENDOCERVIX, CURETTINGS: INVASIVE SQUAMOUS CELL CARCINOMA. FRAGMENTED BIOPSY SPECIMEN.   ENDOMETRIUM, BIOPSY:  SCANT FRAGMENTS OF INACTIVE ENDOMETRIUM, MIXED WITH MINUTE FRAGMENTS OF  BENIGN ENDOCERVICAL GLANDS,  MUCUS, AND BLOOD. NO HYPERPLASIA OR  CARCINOMA.   She was seen in the ER 03/28/2017 for back pain. Workup included    CT scan - dedicated renal protocol: 1. Negative for nephrolithiasis, hydronephrosis or ureteral stone. 2. There are no acute abnormalities visualized.  Dr Theora Gianotti examined patient and felt it was difficult to feel the tumor and get a sense of its size because it is inside the canal.  And she was concerned about her back pain possibly being due to metastatic disease.   PET/CT 04/16/17 IMPRESSION:  I reviewed images with the radiologist on the phone. 1. Small focus of abnormal accentuated activity along the posterior cervix, potentially a site of focal neoplastic disease/malignancy. No pathologic adenopathy or evidence of metastatic spread. 2. Right total hip prosthesis noted with cerclage wires along the stem component. 3. Lumbar spondylosis and degenerative disc disease with potential impingement at L3-4 and L4-5.   MRI IMPRESSION: Image degradation by artifact from right hip prosthesis noted.  Subtle ill-defined 1.9 cm mass in the posterior wall of the cervix, consistent with known cervical carcinoma.  No evidence of extra-cervical tumor extension or pelvic lymphadenopathy.  She was seen at [facility] for consultation and stage was felt to be IIA due to extension of the tumor into posterior vagina.  Decision was made to treat her with chemoradiation and see received weekly cisplatin (05/09/2017-06/06/2017; 5 cycles) and external radiation (05/08/2017-06/21/2017) followed by brachytherapy (07/04/2017-07/24/2017) with complete response.  CT scan 8/20.   IMPRESSION: 1. No evidence of metastatic cervical carcinoma. 2. Stable small enhancing lesion RIGHT hepatic lobe is favored benign. 3. Small external iliac lymph nodes are stable and not pathologic by size criteria.  She had a surveillance visit on 01/05/2021 and Pap ASCUS and HRHPV negative. Dr. Fransisca Connors recommended colposcopy b/c she had bleeding. This was thought to  be secondary to atrophic vaginal tissue and post brachytherapy. If symptoms persist  he recommended CT to evaluate further.   She saw Dr. Theora Gianotti 02/09/21 with colposcopy. Biopsy obtained:  DIAGNOSIS:  A.  VAGINA; BIOPSY:  - SCLEROTIC FIBROUS TISSUE, INTACT EPITHELIUM NOT IDENTIFIED.  - DEEPER SECTIONS EXAMINED.  - NEGATIVE FOR MALIGNANCY.    Problem List: Patient Active Problem List   Diagnosis Date Noted   Encounter for follow-up surveillance of cervical cancer 03/08/2022   Abnormal vaginal bleeding 03/08/2022   RUQ pain 12/30/2021   ASCUS of cervix with negative high risk HPV 02/09/2021   Exertional shortness of breath 12/25/2019   Family history of coronary artery disease 12/25/2019   Strain of rotator cuff capsule 11/12/2017   Anxiety disorder 04/30/2017   Squamous cell carcinoma of cervix ([place]) 04/30/2017   Cervical carcinoma ([place]) 04/06/2017   Hip pain 05/02/2016   History of total hip arthroplasty 05/02/2016    Past Medical History: Past Medical History:  Diagnosis Date   Anxiety    Arrhythmia    Arthritis    Asthma    Cervical cancer ([place]) 03/2017   Rad, chemo and brachytherapy tx's.    Diabetes mellitus without complication ([place])    Hernia of abdominal wall 04/17/2017   Personal history of chemotherapy    Personal history of radiation therapy    cervical cancer    Past Surgical History: Past Surgical History:  Procedure Laterality Date   HIP SURGERY     TUBAL LIGATION      Past Gynecologic History:  Menarche: 15 Menstrual details: postmenopausal Menses regular: no Last Menstrual Period: a year ago History of Abnormal pap: yes as per HPI Last pap: see HPI Contraception: bilateral tubal ligation   OB History:  OB History  Gravida Para Term Preterm AB Living  3 3 3     3  $ SAB IAB Ectopic Multiple Live Births               # Outcome Date GA Lbr Len/2nd Weight Sex Delivery Anes PTL Lv  3 Term 08/05/89          2 Term 09/09/79          1 Term 10/16/69            Family History: Family History  Problem Relation Age of Onset    Diabetes Mother    Breast cancer Neg Hx     Social History: Social History   Socioeconomic History   Marital status: Divorced    Spouse name: Not on file   Number of children: Not on file   Years of education: Not on file   Highest education level: Not on file  Occupational History   Not on file  Tobacco Use   Smoking status: Never   Smokeless tobacco: Never  Vaping Use   Vaping Use: Never used  Substance and Sexual Activity   Alcohol use: No   Drug use: No   Sexual activity: Not Currently    Birth control/protection: None  Other Topics Concern   Not on file  Social History Narrative   Not on file   Social Determinants of Health   Financial Resource Strain: Not on file  Food Insecurity: Not on file  Transportation Needs: Not on file  Physical Activity: Not on file  Stress: Not on file  Social Connections: Not on file  Intimate Partner Violence: Not on file    Allergies: Allergies  Allergen Reactions   Pseudoephedrine Palpitations  Current Medications: Current Outpatient Medications  Medication Sig Dispense Refill   albuterol (VENTOLIN HFA) 108 (90 Base) MCG/ACT inhaler SMARTSIG:2 Puff(s) By Mouth Every 4-6 Hours PRN     atorvastatin (LIPITOR) 20 MG tablet TAKE 1 TABLET BY MOUTH AT BEDTIME FOR HEART PROTECTION AND CHOLESTEROL     dicyclomine (BENTYL) 20 MG tablet Take 1 tablet (20 mg total) by mouth every 6 (six) hours. 20 tablet 0   empagliflozin (JARDIANCE) 25 MG TABS tablet Take 25 mg by mouth daily.     guaiFENesin-codeine 100-10 MG/5ML syrup Take by mouth.     HYDROcodone-acetaminophen (NORCO) 5-325 MG tablet Take 1 tablet by mouth every 6 (six) hours as needed for up to 6 doses for moderate pain. 6 tablet 0   VICTOZA 18 MG/3ML SOPN Inject 0.6 mg into the skin at bedtime.     No current facility-administered medications for this visit.   Review of Systems General:  fatigue  Skin: hot flashes Eyes: no complaints HEENT: no complaints Breasts: no  complaints Pulmonary: cough o/w no complaints Cardiac: no complaints Gastrointestinal: no complaints Genitourinary/Sexual: no complaints Ob/Gyn: bleeding resolved Musculoskeletal: no complaints Hematology: no complaints Neurologic/Psych: mood swings  Objective:  Physical Examination:  BP (!) 145/88   Pulse 84   Temp (!) 96.5 F (35.8 C)   Resp 20   Wt 142 lb 9.6 oz (64.7 kg)   SpO2 100%   BMI 28.80 kg/m   GENERAL: Patient is a well appearing female in no acute distress NODES:  Negative axillary, supraclavicular, inguinal lymph node survery LUNGS:  Clear to auscultation bilaterally.   HEART:  Regular rate and rhythm.  ABDOMEN:  Soft, nontender. No masses or ascites or hernias. Well healed incisions EXTREMITIES:  No peripheral edema. Uses a walker. Limited hip mobility SKIN:  well healed laparoscopic incisions.  NEURO:  Nonfocal. Well oriented.  Appropriate affect.  Pelvic: Exam chaperoned by CMA Kendra Herring: no lesions or discharge. Vagina: atrophic and agglutinated. No gross lesions. Cervix: No visible lesion. Pale. BME: smooth, no palpable masses. Nontender. No nodularity. Rectal: deferred.   Labs:  Per hpi  Imaging:  03/21/22- DG Chest for cough Negative for edema, consolidation. No active cardiopulmonary disease.     Assessment:  Kendra Herring is a 61 y.o. 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 Treated with chemoradiation and received weekly cisplatin (05/09/2017-06/06/2017; 5 cycles) and external radiation (05/08/2017-06/21/2017) followed by brachytherapy (07/04/2017-07/24/2017) with complete response.  01/05/2021 ASCUS Pap/HRHPV 02/09/21 abnormal AWE vagina on colposcopy; Biopsy 7 o'clock negative. Repeat pap 02/2022- NILM, HPV Negative.  Clinically, NED today.   CT abdomen pelvis d/t abdominal pain 12/27/21 was negative for recurrence or metastatic disease.   Vaginal spotting and bleeding  intermittently of unknown origin. Most likely cause is atrophy and radiation changes. Now resolved.   Cough- resolved. Suspect post viral  Hot flashes- secondary to menopause d/t treatments for malignancy  Medical co-morbidities complicating care:  history of cardiac arrythmia, s/p hip surgery with mobility issues, and arthritis .   Plan:   Problem List Items Addressed This Visit   None  We reviewed NCCN surveillance guidelines. She is now 5 years from completion of treatment which is encouraging. She can transition to annual pelvic exams. We discussed symptoms that would be concerning for recurrence and warrant earlier return.   We can start venlafaxine 37.5 mg daily x 2 weeks then increase to 75 mg daily for her hot flashes.  She could also consider topical estrogen for her vaginal atrophy symptoms. She prefers non-hormonal options. Recommend vaginal dilators and moisturizers.   Previously abnormal pap with negative biopsy. Repeat pap in May 2023 negative. Repeat per asccp guidelines or annually given her hx of cervical cancer.   She can follow up with her pcp for her chronic medical problems and other cancer screenings and wellness care.   I will see her back in 3 months to reassess hot flashes. She can return to gyn onc clinic in 1 year for continued surveillance.   The patient's diagnosis, an outline of the further diagnostic and laboratory studies which will be required, the recommendation, and alternatives were discussed.  All questions were answered to the patient's satisfaction.   Verlon Au, NP

## 2022-09-07 ENCOUNTER — Telehealth: Payer: Self-pay | Admitting: *Deleted

## 2022-09-07 NOTE — Telephone Encounter (Signed)
Call returned to patient and informed of NP response and she said she will get new prescription and start taking it

## 2022-09-07 NOTE — Telephone Encounter (Signed)
Patient called stating Lauren wanted to know if the medicine she wants to order is the same as the one patient had before. She said that what she had and has now expired was Venlafaxine. She was unsure what Ander Purpura is wanting to order

## 2022-12-07 ENCOUNTER — Inpatient Hospital Stay: Payer: 59 | Attending: Obstetrics and Gynecology | Admitting: Nurse Practitioner

## 2022-12-07 ENCOUNTER — Encounter: Payer: Self-pay | Admitting: Nurse Practitioner

## 2022-12-07 VITALS — BP 141/72 | HR 84 | Temp 97.1°F | Wt 143.0 lb

## 2022-12-07 DIAGNOSIS — N951 Menopausal and female climacteric states: Secondary | ICD-10-CM

## 2022-12-07 DIAGNOSIS — Z7984 Long term (current) use of oral hypoglycemic drugs: Secondary | ICD-10-CM | POA: Insufficient documentation

## 2022-12-07 DIAGNOSIS — Z7985 Long-term (current) use of injectable non-insulin antidiabetic drugs: Secondary | ICD-10-CM | POA: Diagnosis not present

## 2022-12-07 DIAGNOSIS — Z79899 Other long term (current) drug therapy: Secondary | ICD-10-CM | POA: Insufficient documentation

## 2022-12-07 DIAGNOSIS — Z8541 Personal history of malignant neoplasm of cervix uteri: Secondary | ICD-10-CM | POA: Diagnosis present

## 2022-12-07 DIAGNOSIS — E119 Type 2 diabetes mellitus without complications: Secondary | ICD-10-CM | POA: Insufficient documentation

## 2022-12-07 DIAGNOSIS — R232 Flushing: Secondary | ICD-10-CM | POA: Insufficient documentation

## 2022-12-07 DIAGNOSIS — Z923 Personal history of irradiation: Secondary | ICD-10-CM | POA: Diagnosis not present

## 2022-12-07 DIAGNOSIS — N941 Unspecified dyspareunia: Secondary | ICD-10-CM | POA: Insufficient documentation

## 2022-12-07 DIAGNOSIS — Z9221 Personal history of antineoplastic chemotherapy: Secondary | ICD-10-CM | POA: Insufficient documentation

## 2022-12-07 NOTE — Progress Notes (Signed)
[facility] at [facility] Oncology/Hematology Interval Visit    Diagnosis: cervical cancer  Chief Complaint: Hot flashes, Mood swings, Dyspareunia  Subjective:  Kendra Herring is a 62 y.o. female, initially seen in consultation from Kendra Herring for invasive cervical cancer, s/p chemo-radiation followed by brachytherapy completed 07/2017, returns to clinic for follow up for hot flashes and mood swings.   She saw seen in November 2023 and was NED at that time. She was started on venlafaxine at that time. Declined vaginal estrogen preferring nonhormonal options.    Oncology History Kendra Herring is a pleasant postmenopausal [phone] who presented to Kendra Herring evaluation and management of abnormal cervical cytology, AGUS, favor high risk cells.  Pt also had abnormal exam appearance of cervix on exam.    Colposcopy performed with 4% acetic acid as well as EMBx. Exam notable for the following: Aceto-white Lesions Location(s): throughout, abnormal appearance, contact bleeding  Pathology- 03/28/2017:         Part A: CERVIX, 6:00, BIOPSY: INVASIVE SQUAMOUS CELL CARCINOMA.  Part B: CERVIX, 10:00, BIOPSY: HIGH-GRADE SQUAMOUS INTRAEPITHELIAL LESION (CIN 3). CHRONIC CERVICITIS WITH SQUAMOUS METAPLASIA.  Part C: CERVIX, 12:00, BIOPSY: FOCAL HIGH-GRADE SQUAMOUS INTRAEPITHELIAL LESION (CIN 3). CHRONIC CERVICITIS.  Part D: ENDOCERVIX, CURETTINGS: INVASIVE SQUAMOUS CELL CARCINOMA. FRAGMENTED BIOPSY SPECIMEN.   ENDOMETRIUM, BIOPSY:  SCANT FRAGMENTS OF INACTIVE ENDOMETRIUM, MIXED WITH MINUTE FRAGMENTS OF  BENIGN ENDOCERVICAL GLANDS,  MUCUS, AND BLOOD. NO HYPERPLASIA OR  CARCINOMA.   She was seen in the ER 03/28/2017 for back pain. Workup included   CT scan - dedicated renal protocol: 1. Negative for nephrolithiasis, hydronephrosis or ureteral stone. 2. There are no acute abnormalities visualized.  Dr Kendra Herring examined patient and felt it was difficult to feel the tumor and get a  sense of its size because it is inside the canal.  And she was concerned about her back pain possibly being due to metastatic disease.   PET/CT 04/16/17 IMPRESSION:  I reviewed images with the radiologist on the phone. 1. Small focus of abnormal accentuated activity along the posterior cervix, potentially a site of focal neoplastic disease/malignancy. No pathologic adenopathy or evidence of metastatic spread. 2. Right total hip prosthesis noted with cerclage wires along the stem component. 3. Lumbar spondylosis and degenerative disc disease with potential impingement at L3-4 and L4-5.   MRI IMPRESSION: Image degradation by artifact from right hip prosthesis noted.  Subtle ill-defined 1.9 cm mass in the posterior wall of the cervix, consistent with known cervical carcinoma.  No evidence of extra-cervical tumor extension or pelvic lymphadenopathy.  She was seen at [facility] for consultation and stage was felt to be IIA due to extension of the tumor into posterior vagina.  Decision was made to treat her with chemoradiation and see received weekly cisplatin (05/09/2017-06/06/2017; 5 cycles) and external radiation (05/08/2017-06/21/2017) followed by brachytherapy (07/04/2017-07/24/2017) with complete response.  CT scan 8/20.   IMPRESSION: 1. No evidence of metastatic cervical carcinoma. 2. Stable small enhancing lesion RIGHT hepatic lobe is favored benign. 3. Small external iliac lymph nodes are stable and not pathologic by size criteria.  She had a surveillance visit on 01/05/2021 and Pap ASCUS and HRHPV negative. Dr. Johnnette Herring recommended colposcopy b/c she had bleeding. This was thought to be secondary to atrophic vaginal tissue and post brachytherapy. If symptoms persist he recommended CT to evaluate further.   She saw Dr. Sonia Herring 02/09/21 with colposcopy. Biopsy obtained:  DIAGNOSIS:  A.  VAGINA; BIOPSY:  - SCLEROTIC FIBROUS TISSUE,  INTACT EPITHELIUM NOT IDENTIFIED.  - DEEPER SECTIONS EXAMINED.  -  NEGATIVE FOR MALIGNANCY.    Problem List: Patient Active Problem List   Diagnosis Date Noted   Encounter for follow-up surveillance of cervical cancer 03/08/2022   Abnormal vaginal bleeding 03/08/2022   RUQ pain 12/30/2021   ASCUS of cervix with negative high risk HPV 02/09/2021   Exertional shortness of breath 12/25/2019   Family history of coronary artery disease 12/25/2019   Strain of rotator cuff capsule 11/12/2017   Anxiety disorder 04/30/2017   Squamous cell carcinoma of cervix (HCC) 04/30/2017   Cervical carcinoma (HCC) 04/06/2017   Hip pain 05/02/2016   History of total hip arthroplasty 05/02/2016    Past Medical History: Past Medical History:  Diagnosis Date   Anxiety    Arrhythmia    Arthritis    Asthma    Cervical cancer (HCC) 03/2017   Rad, chemo and brachytherapy tx's.    Diabetes mellitus without complication (HCC)    Hernia of abdominal wall 04/17/2017   Personal history of chemotherapy    Personal history of radiation therapy    cervical cancer    Past Surgical History: Past Surgical History:  Procedure Laterality Date   HIP SURGERY     TUBAL LIGATION      Past Gynecologic History:  Menarche: 15 Menstrual details: postmenopausal Menses regular: no Last Menstrual Period: a year ago History of Abnormal pap: yes as per HPI Last pap: see HPI Contraception: bilateral tubal ligation   OB History:  OB History  Gravida Para Term Preterm AB Living  3 3 3     3   SAB IAB Ectopic Multiple Live Births               # Outcome Date GA Lbr Len/2nd Weight Sex Delivery Anes PTL Lv  3 Term 08/05/89          2 Term 09/09/79          1 Term 10/16/69            Family History: Family History  Problem Relation Age of Onset   Diabetes Mother    Breast cancer Neg Hx     Social History: Social History   Socioeconomic History   Marital status: Divorced    Spouse name: Not on file   Number of children: Not on file   Years of education: Not on file    Highest education level: Not on file  Occupational History   Not on file  Tobacco Use   Smoking status: Never   Smokeless tobacco: Never  Vaping Use   Vaping Use: Never used  Substance and Sexual Activity   Alcohol use: No   Drug use: No   Sexual activity: Not Currently    Birth control/protection: None  Other Topics Concern   Not on file  Social History Narrative   Not on file   Social Determinants of Health   Financial Resource Strain: Not on file  Food Insecurity: Not on file  Transportation Needs: Not on file  Physical Activity: Not on file  Stress: Not on file  Social Connections: Not on file  Intimate Partner Violence: Not on file    Allergies: Allergies  Allergen Reactions   Pseudoephedrine Palpitations    Current Medications: Current Outpatient Medications  Medication Sig Dispense Refill   albuterol (VENTOLIN HFA) 108 (90 Base) MCG/ACT inhaler SMARTSIG:2 Puff(s) By Mouth Every 4-6 Hours PRN     atorvastatin (LIPITOR) 20 MG tablet TAKE  1 TABLET BY MOUTH AT BEDTIME FOR HEART PROTECTION AND CHOLESTEROL     dicyclomine (BENTYL) 20 MG tablet Take 1 tablet (20 mg total) by mouth every 6 (six) hours. 20 tablet 0   empagliflozin (JARDIANCE) 25 MG TABS tablet Take 25 mg by mouth daily.     gabapentin (NEURONTIN) 300 MG capsule Take 300 mg by mouth 3 (three) times daily.     guaiFENesin-codeine 100-10 MG/5ML syrup Take by mouth.     meloxicam (MOBIC) 7.5 MG tablet Take 7.5 mg by mouth daily as needed.     VICTOZA 18 MG/3ML SOPN Inject 0.6 mg into the skin at bedtime.     DULoxetine (CYMBALTA) 30 MG capsule Take 30 mg by mouth daily. (Patient not taking: Reported on 12/07/2022)     HYDROcodone-acetaminophen (NORCO) 5-325 MG tablet Take 1 tablet by mouth every 6 (six) hours as needed for up to 6 doses for moderate pain. (Patient not taking: Reported on 09/06/2022) 6 tablet 0   venlafaxine XR (EFFEXOR-XR) 37.5 MG 24 hr capsule Take 1 capsule (37.5 mg total) by mouth daily  with breakfast for 14 days, THEN 2 capsules (75 mg total) daily with breakfast. 166 capsule 0   No current facility-administered medications for this visit.   Review of Systems General:  fatigue  Skin: hot flashes Eyes: no complaints HEENT: no complaints Breasts: no complaints Pulmonary: cough o/w no complaints Cardiac: no complaints Gastrointestinal: no complaints Genitourinary/Sexual: no complaints Ob/Gyn: bleeding resolved Musculoskeletal: no complaints Hematology: no complaints Neurologic/Psych: mood swings  Objective:  Physical Examination:  BP (!) 141/72 (BP Location: Right Arm, Patient Position: Sitting)   Pulse 84   Temp (!) 97.1 F (36.2 C) (Tympanic)   Wt 143 lb (64.9 kg)   SpO2 97%   BMI 28.88 kg/m   GENERAL: Patient is a well appearing female in no acute distress NODES:  Negative axillary, supraclavicular, inguinal lymph node survery LUNGS:  Clear to auscultation bilaterally.   HEART:  Regular rate and rhythm.  ABDOMEN:  Soft, nontender. No masses or ascites or hernias. Well healed incisions EXTREMITIES:  No peripheral edema. Uses a walker. Limited hip mobility SKIN:  well healed laparoscopic incisions.  NEURO:  Nonfocal. Well oriented.  Appropriate affect.  Pelvic: exam deferred  Labs:  Per hpi  Imaging:  No imaging on site    Assessment:  Kendra Herring is a 62 y.o. 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 Treated with chemoradiation and received weekly cisplatin (05/09/2017-06/06/2017; 5 cycles) and external radiation (05/08/2017-06/21/2017) followed by brachytherapy (07/04/2017-07/24/2017) with complete response. 01/05/2021 ASCUS Pap/HRHPV 02/09/21 abnormal AWE vagina on colposcopy; Biopsy 7 o'clock negative. Repeat pap 02/2022- NILM, HPV Negative. Clinically, NED on prior exam and asymptomatic today.    CT abdomen pelvis d/t abdominal pain 12/27/21 was negative for recurrence or  metastatic disease.   Vaginal spotting and bleeding intermittently of unknown origin. Most likely cause is atrophy and radiation changes. Now resolved.   Dyspareunia- d/t atrophy and radiation related to treatments.   Cough- resolved. Suspect post viral  Hot flashes- secondary to menopause d/t treatments for malignancy. Self discontinued venlafaxine.   Medical co-morbidities complicating care:  history of cardiac arrythmia, s/p hip surgery with mobility issues, and arthritis .   Plan:   Problem List Items Addressed This Visit   None  Again reviewed symptoms that would be concerning for recurrent disease and warrant sooner return. She is now > 5  years from completion of treatment and has transitioned to annual exams. We are scheduled to see her in November for surveillance.   Declined venlafaxine and prefers to minimize medications which is reasonable. Vasomotor symptoms are improving. I can see her back any anytime if she changes her mind.   We discussed options for dyspareunia including vaginal estrogen, dilators, moisturizers. Discussed otc options.   She can follow up with her pcp for her chronic medical problems and other cancer screenings and wellness care.   She can return to gyn onc clinic in November for continued surveillance.   The patient's diagnosis, an outline of the further diagnostic and laboratory studies which will be required, the recommendation, and alternatives were discussed.  All questions were answered to the patient's satisfaction.   Alinda Dooms, NP

## 2023-01-03 ENCOUNTER — Other Ambulatory Visit: Payer: Self-pay | Admitting: Family Medicine

## 2023-01-03 DIAGNOSIS — Z1231 Encounter for screening mammogram for malignant neoplasm of breast: Secondary | ICD-10-CM

## 2023-02-05 ENCOUNTER — Ambulatory Visit
Admission: RE | Admit: 2023-02-05 | Discharge: 2023-02-05 | Disposition: A | Discharge status: Home / Self Care | Payer: Medicare HMO | Source: Ambulatory Visit | Attending: Family Medicine | Admitting: Family Medicine

## 2023-02-05 DIAGNOSIS — Z1231 Encounter for screening mammogram for malignant neoplasm of breast: Secondary | ICD-10-CM | POA: Diagnosis present

## 2023-04-17 ENCOUNTER — Telehealth: Payer: Self-pay | Admitting: *Deleted

## 2023-04-17 ENCOUNTER — Other Ambulatory Visit: Payer: Self-pay | Admitting: *Deleted

## 2023-04-17 ENCOUNTER — Telehealth: Payer: Self-pay

## 2023-04-17 DIAGNOSIS — Z1211 Encounter for screening for malignant neoplasm of colon: Secondary | ICD-10-CM

## 2023-04-17 MED ORDER — NA SULFATE-K SULFATE-MG SULF 17.5-3.13-1.6 GM/177ML PO SOLN: 1.0000 | Freq: Once | ORAL | 0 refills | Status: AC

## 2023-04-17 NOTE — Telephone Encounter (Signed)
Colonoscopy schedule on 06/11/2023 with Dr Tobi Bastos

## 2023-04-17 NOTE — Telephone Encounter (Signed)
Gastroenterology Pre-Procedure Review  Request Date: 06/11/2023 Requesting Physician: Dr. Tobi Bastos  PATIENT REVIEW QUESTIONS: The patient responded to the following health history questions as indicated:    1. Are you having any GI issues? no 2. Do you have a personal history of Polyps? no 3. Do you have a family history of Colon Cancer or Polyps? no 4. Diabetes Mellitus? yes (taking Jardiance and Ozempic) 5. Joint replacements in the past 12 months?no 6. Major health problems in the past 3 months?no 7. Any artificial heart valves, MVP, or defibrillator?no    MEDICATIONS & ALLERGIES:    Patient reports the following regarding taking any anticoagulation/antiplatelet therapy:   Plavix, Coumadin, Eliquis, Xarelto, Lovenox, Pradaxa, Brilinta, or Effient? no Aspirin? no  Patient confirms/reports the following medications:  Current Outpatient Medications  Medication Sig Dispense Refill   albuterol (VENTOLIN HFA) 108 (90 Base) MCG/ACT inhaler SMARTSIG:2 Puff(s) By Mouth Every 4-6 Hours PRN     atorvastatin (LIPITOR) 20 MG tablet TAKE 1 TABLET BY MOUTH AT BEDTIME FOR HEART PROTECTION AND CHOLESTEROL     dicyclomine (BENTYL) 20 MG tablet Take 1 tablet (20 mg total) by mouth every 6 (six) hours. 20 tablet 0   DULoxetine (CYMBALTA) 30 MG capsule Take 30 mg by mouth daily. (Patient not taking: Reported on 12/07/2022)     empagliflozin (JARDIANCE) 25 MG TABS tablet Take 25 mg by mouth daily.     gabapentin (NEURONTIN) 300 MG capsule Take 300 mg by mouth 3 (three) times daily.     guaiFENesin-codeine 100-10 MG/5ML syrup Take by mouth.     HYDROcodone-acetaminophen (NORCO) 5-325 MG tablet Take 1 tablet by mouth every 6 (six) hours as needed for up to 6 doses for moderate pain. (Patient not taking: Reported on 09/06/2022) 6 tablet 0   meloxicam (MOBIC) 7.5 MG tablet Take 7.5 mg by mouth daily as needed.     venlafaxine XR (EFFEXOR-XR) 37.5 MG 24 hr capsule Take 1 capsule (37.5 mg total) by mouth daily with  breakfast for 14 days, THEN 2 capsules (75 mg total) daily with breakfast. 166 capsule 0   VICTOZA 18 MG/3ML SOPN Inject 0.6 mg into the skin at bedtime.     No current facility-administered medications for this visit.    Patient confirms/reports the following allergies:  Allergies  Allergen Reactions   Pseudoephedrine Palpitations    No orders of the defined types were placed in this encounter.   AUTHORIZATION INFORMATION Primary Insurance: 1D#: Group #:  Secondary Insurance: 1D#: Group #:  SCHEDULE INFORMATION: Date: 06/11/2023 Time: Location: [facility]

## 2023-04-17 NOTE — Telephone Encounter (Signed)
Patient is returning your call to schedule her colonoscopy. Please call patient back today she is available all day

## 2023-06-11 ENCOUNTER — Other Ambulatory Visit: Payer: Self-pay | Admitting: *Deleted

## 2023-06-11 ENCOUNTER — Telehealth: Payer: Self-pay | Admitting: *Deleted

## 2023-06-11 ENCOUNTER — Encounter: Payer: Self-pay | Admitting: Anesthesiology

## 2023-06-11 ENCOUNTER — Encounter
Admission: RE | Disposition: A | Discharge status: Home / Self Care | Payer: Self-pay | Source: Home / Self Care | Attending: Gastroenterology

## 2023-06-11 ENCOUNTER — Ambulatory Visit
Admission: RE | Admit: 2023-06-11 | Discharge: 2023-06-11 | Disposition: A | Discharge status: Home / Self Care | Payer: Medicare HMO | Source: Home / Self Care | Attending: Gastroenterology | Admitting: Gastroenterology

## 2023-06-11 DIAGNOSIS — Z1211 Encounter for screening for malignant neoplasm of colon: Secondary | ICD-10-CM

## 2023-06-11 DIAGNOSIS — Z539 Procedure and treatment not carried out, unspecified reason: Secondary | ICD-10-CM | POA: Insufficient documentation

## 2023-06-11 HISTORY — PX: COLONOSCOPY WITH PROPOFOL: SHX5780

## 2023-06-11 LAB — GLUCOSE, CAPILLARY: Glucose-Capillary: 213 mg/dL — ABNORMAL HIGH (ref 70–99)

## 2023-06-11 SURGERY — COLONOSCOPY WITH PROPOFOL
Anesthesia: General

## 2023-06-11 MED ORDER — PROPOFOL 10 MG/ML IV BOLUS
INTRAVENOUS | Status: AC
  Filled 2023-06-11: qty 40

## 2023-06-11 MED ORDER — SODIUM CHLORIDE 0.9 % IV SOLN
INTRAVENOUS | Status: DC
  Administered 2023-06-11: 20 mL/h via INTRAVENOUS

## 2023-06-11 MED ORDER — SUTAB 1479-225-188 MG PO TABS: ORAL_TABLET | ORAL | 0 refills | Status: DC

## 2023-06-11 MED ORDER — CLENPIQ 10-3.5-12 MG-GM -GM/175ML PO SOLN: 1.0000 | Freq: Once | ORAL | 0 refills | Status: AC

## 2023-06-11 NOTE — Telephone Encounter (Signed)
Per message from Dr Tobi Bastos, didn't drink whole prep - wants sutab -reschedule please  Called patient to schedule the repeat colonoscopy.  Requesting to reschedule to 07/19/2023.  I have sent Rx of Sutab and Clenpiq to [facility] instructions will be sent.

## 2023-06-12 ENCOUNTER — Encounter: Payer: Self-pay | Admitting: Gastroenterology

## 2023-07-12 ENCOUNTER — Other Ambulatory Visit: Payer: Self-pay

## 2023-07-12 ENCOUNTER — Encounter: Payer: Self-pay | Admitting: Gastroenterology

## 2023-07-12 ENCOUNTER — Telehealth: Payer: Self-pay | Admitting: *Deleted

## 2023-07-12 NOTE — Telephone Encounter (Signed)
Per Dr Tobi Bastos  So if the patient is having diarrhea would need a workup before we just take biopsies to rule out microscopic colitis as there are other reasons for diarrhea including celiac disease infectious colitis and many more. My recommendation is that if the diarrhea has been going on for a while then needs an outpatient visit with workup before the colonoscopy so we do the job once and do it right.

## 2023-07-12 NOTE — Telephone Encounter (Signed)
Patient wanted me to let the office know she has had diarrhea so maybe Dr. Tobi Bastos could take biopsies to see if there's anything on a microscopic level going on. It is coded as screening. Thanks!   From Shelbie Hutching, RN at endo unit

## 2023-07-12 NOTE — Telephone Encounter (Signed)
I have called patient to cancel the colonoscopy 07/19/2023. We will reschedule patient following the office visit.

## 2023-07-12 NOTE — Telephone Encounter (Signed)
08/23/2023 Kendra Herring has a 3:00 for a ov I know he does not want a lot of overbooks

## 2023-07-13 NOTE — Telephone Encounter (Signed)
Are you willing to Double book patient somewhere on your schedule or does patient has to wait till your next available appointment

## 2023-07-13 NOTE — Telephone Encounter (Signed)
Got permission from Inetta Fermo so I was able to add patient on her schedule for 11:15 am on 07/19/2023

## 2023-07-13 NOTE — Telephone Encounter (Signed)
Patient was hoping for something sooner. She is having a lot of diarrhea and not sure if possible from infection or medications.

## 2023-07-17 ENCOUNTER — Other Ambulatory Visit: Payer: Self-pay

## 2023-07-18 NOTE — Progress Notes (Signed)
Celso Amy, PA-C [address]  [address]  [place], [place] [address]  Main: [phone]  Fax: [phone]   Gastroenterology Consultation  Referring Provider:     No ref. provider found Primary Care Physician:  System, Provider Not In Primary Gastroenterologist:  Celso Amy, PA-C / Dr. Wyline Mood   Reason for Consultation:     Diarrhea        HPI:   Kendra Herring is a 63 y.o. y/o female referred for consultation & management  by System, Provider Not In.  Here to evaluate diarrhea.  [facility]P is [facility] in [place].  Prior to that she saw Dr. Phineas Real.  Current symptoms: Patient states she has had chronic intermittent diarrhea since her 20s or 30s.  Diarrhea has been worse during stressful times in her life.  She is currently having 6 loose stools daily.  She denies rectal bleeding, or weight loss.  She underwent cholecystectomy 1 year ago.  Underwent chemotherapy and radiation for cervical cancer 3 years ago.  Diarrhea worsened after that.  Diarrhea occurs after eating or drinking anything.  She is afraid to eat or drink.  No recent antibiotics or travel.  She has not had any stool studies or workup for her diarrhea.  She also reports LUQ pain for 1 month.  She has noticed a bulge in the skin of her abdomen over the LUQ.  It is sore at times.  Take Tylenol as needed.  She is age 77 and has never had a colonoscopy.  She is due for her for screening colonoscopy.  She attempted colonoscopy recently and was unable to tolerate GoLytely prep due to nausea and vomiting.  Has history of diabetes currently on Jardiance and Ozempic.  She admits to moderate stress.  Currently walks with a walker.  No previous EGD, colonoscopy, or GI evaluation  No recent labs, stool studies or evaluation for diarrhea.  -RUQ ultrasound 12/2021 showed contracted gallbladder and hepatic steatosis.  No sludge or gallstones. -HIDA scan 12/2021 showed low gallbladder ejection fraction 25%.   Biliary dyskinesia. -Abdominal pelvic CT with contrast 12/2021 showed no acute abnormality.  Incidental duodenal diverticulum.  Past Medical History:  Diagnosis Date   Anxiety    Arrhythmia    Arthritis    Asthma    Cervical cancer (HCC) 03/2017   Rad, chemo and brachytherapy tx's.    Diabetes mellitus without complication (HCC)    Hernia of abdominal wall 04/17/2017   Personal history of chemotherapy    Personal history of radiation therapy    cervical cancer    Past Surgical History:  Procedure Laterality Date   CHOLECYSTECTOMY  2023   COLONOSCOPY WITH PROPOFOL N/A 06/11/2023   Procedure: COLONOSCOPY WITH PROPOFOL;  Surgeon: Wyline Mood, MD;  Location[facility] END[facility]COPY;  Service: Gastroenterology;  Laterality: N/A;   HIP SURGERY     TUBAL LIGATION      Prior to Admission medications   Medication Sig Start Date End Date Taking? Authorizing Provider  albuterol (VENTOLIN HFA) 108 (90 Base) MCG/ACT inhaler SMARTSIG:2 Puff(s) By Mouth Every 4-6 Hours PRN 02/23/22   [provider]  atorvastatin (LIPITOR) 20 MG tablet TAKE 1 TABLET BY MOUTH AT BEDTIME FOR HEART PROTECTION AND CHOLESTEROL 03/30/21   [provider]  D2000 ULTRA STRENGTH 50 MCG (2000 UT) CAPS Take 1 capsule by mouth once a week. 05/17/23   [provider]  dicyclomine (BENTYL) 20 MG tablet Take 1 tablet (20 mg total) by mouth  every 6 (six) hours. 12/27/21   Irean Hong, MD  DULoxetine (CYMBALTA) 30 MG capsule Take 30 mg by mouth daily. Patient not taking: Reported on 12/07/2022 11/30/22   [provider]  empagliflozin (JARDIANCE) 25 MG TABS tablet Take 25 mg by mouth daily.    [provider]  gabapentin (NEURONTIN) 300 MG capsule Take 300 mg by mouth 3 (three) times daily. 06/15/22   [provider]  guaiFENesin-codeine 100-10 MG/5ML syrup Take by mouth. 02/23/22   [provider]  HYDROcodone-acetaminophen (NORCO) 5-325 MG tablet Take 1 tablet by mouth every 6 (six)  hours as needed for up to 6 doses for moderate pain. Patient not taking: Reported on 09/06/2022 01/03/22   Sung Amabile, DO  meloxicam (MOBIC) 7.5 MG tablet Take 7.5 mg by mouth daily as needed. 08/24/22   [provider]  Na Sulfate-K Sulfate-Mg Sulf 17.5-3.13-1.6 GM/177ML SOLN SMARTSIG:1 Kit(s) By Mouth Once 04/17/23   [provider]  OZEMPIC, 1 MG/DOSE, 4 MG/3ML SOPN Inject 1 mg into the skin once a week. 03/26/23   [provider]  Sodium Sulfate-Mag Sulfate-KCl (SUTAB) [phone] MG TABS Take 1 kit by mouth once as instructed 06/11/23   Wyline Mood, MD  venlafaxine XR (EFFEXOR-XR) 37.5 MG 24 hr capsule Take 1 capsule (37.5 mg total) by mouth daily with breakfast for 14 days, THEN 2 capsules (75 mg total) daily with breakfast. 09/06/22 12/05/22  Alinda Dooms, NP    Family History  Problem Relation Age of Onset   Diabetes Mother    Breast cancer Neg Hx      Social History   Tobacco Use   Smoking status: Never   Smokeless tobacco: Never  Vaping Use   Vaping status: Never Used  Substance Use Topics   Alcohol use: No   Drug use: No    Allergies as of 07/19/2023 - Review Complete 07/19/2023  Allergen Reaction Noted   Pseudoephedrine Palpitations 07/04/2017    Review of Systems:    All systems reviewed and negative except where noted in HPI.   Physical Exam:  BP (!) 140/78   Pulse 84   Temp 98.1 F (36.7 C)   Ht 4\' 11"  (1.499 m)   Wt 143 lb 6.4 oz (65 kg)   BMI 28.96 kg/m  No LMP recorded. Patient is postmenopausal.  General:   Alert,  Well-developed, well-nourished, pleasant and cooperative in NAD; walks with a walker. Lungs:  Respirations even and unlabored.  Clear throughout to auscultation.   No wheezes, crackles, or rhonchi. No acute distress. Heart:  Regular rate and rhythm; no murmurs, clicks, rubs, or gallops. Abdomen:  Normal bowel sounds.  No bruits.  Soft, and non-distended without masses, hepatosplenomegaly or hernias noted.  Mild  LUQ Tenderness (query sub-mucosal lipoma).  Rest of abdomen is not tender.  No guarding or rebound tenderness.    Neurologic:  Alert and oriented x3; walks with a walker.  Lower extremity weakness. Psych:  Alert and cooperative.  Anxious/tearful mood and affect.  Imaging Studies: No results found.  Assessment and Plan:   Kendra Herring is a 62 y.o. y/o female has been referred for chronic diarrhea.  Differential includes irritable bowel syndrome, microscopic colitis, infectious diarrhea, celiac, bile salt diarrhea postcholecystectomy.  IBD is doubtful based on history.  1.  Chronic diarrhea  Stool Studies: GI Pathogen Panal, C. Difficile Toxin PCR, Fecal Calprotectin   Lab: Celiac panal, TSH  2.  LUQ abdominal pain  Labs:  CBC, CMP,  Lipase   3.  Colon cancer screening  If stool studies are negative for infections and labs are unremarkable, then we will schedule first screening colonoscopy.  Consider checking for microscopic colitis.    Patient cannot tolerate GoLytely prep.  She will need a small volume prep.  Follow up in 2 weeks with Inetta Fermo.  Celso Amy, PA-C

## 2023-07-19 ENCOUNTER — Encounter: Admission: RE | Payer: Self-pay | Source: Home / Self Care

## 2023-07-19 ENCOUNTER — Ambulatory Visit: Admission: RE | Admit: 2023-07-19 | Payer: Medicare HMO | Source: Home / Self Care | Admitting: Gastroenterology

## 2023-07-19 ENCOUNTER — Ambulatory Visit (INDEPENDENT_AMBULATORY_CARE_PROVIDER_SITE_OTHER): Payer: Medicare HMO | Admitting: Physician Assistant

## 2023-07-19 ENCOUNTER — Encounter: Payer: Self-pay | Admitting: Physician Assistant

## 2023-07-19 VITALS — BP 140/78 | HR 84 | Temp 98.1°F | Ht 59.0 in | Wt 143.4 lb

## 2023-07-19 DIAGNOSIS — R1012 Left upper quadrant pain: Secondary | ICD-10-CM

## 2023-07-19 DIAGNOSIS — Z1211 Encounter for screening for malignant neoplasm of colon: Secondary | ICD-10-CM | POA: Diagnosis not present

## 2023-07-19 DIAGNOSIS — R197 Diarrhea, unspecified: Secondary | ICD-10-CM | POA: Diagnosis not present

## 2023-07-19 SURGERY — COLONOSCOPY WITH PROPOFOL
Anesthesia: General

## 2023-07-25 NOTE — Progress Notes (Signed)
Notify patient labs show: 1.  Negative celiac labs.  No gluten allergy. 2.  Normal lipase.  No evidence of pancreatitis. 3.  Normal TSH thyroid test. 4.  CBC shows normal white count.  No infection.  Hide normal hemoglobin, not worrisome.  No evidence of anemia. 5.  Glucose is mildly elevated at 172.  Follow-up with PCP for diabetes.  Normal kidney function.  1 liver test (ALT) is slightly elevated, yet improved from labs 1 year ago.  All other liver tests are normal. 6.  Nothing worrisome with blood work.  Continue with plan to do stool studies and keep follow-up OV as scheduled.

## 2023-07-26 ENCOUNTER — Telehealth: Payer: Self-pay

## 2023-07-26 LAB — COMPREHENSIVE METABOLIC PANEL
ALT: 41 [IU]/L — ABNORMAL HIGH (ref 0–32)
AST: 29 [IU]/L (ref 0–40)
Albumin: 4.4 g/dL (ref 3.9–4.9)
Alkaline Phosphatase: 100 [IU]/L (ref 44–121)
BUN/Creatinine Ratio: 19 (ref 12–28)
BUN: 12 mg/dL (ref 8–27)
Bilirubin Total: 0.5 mg/dL (ref 0.0–1.2)
CO2: 22 mmol/L (ref 20–29)
Calcium: 9.9 mg/dL (ref 8.7–10.3)
Chloride: 101 mmol/L (ref 96–106)
Creatinine, Ser: 0.63 mg/dL (ref 0.57–1.00)
Globulin, Total: 2.7 g/dL (ref 1.5–4.5)
Glucose: 172 mg/dL — ABNORMAL HIGH (ref 70–99)
Potassium: 3.9 mmol/L (ref 3.5–5.2)
Sodium: 140 mmol/L (ref 134–144)
Total Protein: 7.1 g/dL (ref 6.0–8.5)
eGFR: 100 mL/min/{1.73_m2} (ref >59)

## 2023-07-26 LAB — LIPASE: Lipase: 25 U/L (ref 14–72)

## 2023-07-26 LAB — CBC WITH DIFFERENTIAL/PLATELET
Basophils Absolute: 0 10*3/uL (ref 0.0–0.2)
Basos: 0 %
EOS (ABSOLUTE): 0.2 10*3/uL (ref 0.0–0.4)
Eos: 3 %
Hematocrit: 49.6 % — ABNORMAL HIGH (ref 34.0–46.6)
Hemoglobin: 16.3 g/dL — ABNORMAL HIGH (ref 11.1–15.9)
Immature Grans (Abs): 0 10*3/uL (ref 0.0–0.1)
Immature Granulocytes: 0 %
Lymphocytes Absolute: 1.7 10*3/uL (ref 0.7–3.1)
Lymphs: 31 %
MCH: 30.4 pg (ref 26.6–33.0)
MCHC: 32.9 g/dL (ref 31.5–35.7)
MCV: 92 fL (ref 79–97)
Monocytes Absolute: 0.3 10*3/uL (ref 0.1–0.9)
Monocytes: 6 %
Neutrophils Absolute: 3.2 10*3/uL (ref 1.4–7.0)
Neutrophils: 60 %
Platelets: 203 10*3/uL (ref 150–450)
RBC: 5.37 x10E6/uL — ABNORMAL HIGH (ref 3.77–5.28)
RDW: 12.3 % (ref 11.7–15.4)
WBC: 5.5 10*3/uL (ref 3.4–10.8)

## 2023-07-26 LAB — TSH: TSH: 3.93 u[IU]/mL (ref 0.450–4.500)

## 2023-07-26 LAB — CELIAC DISEASE AB SCREEN W/RFX
Antigliadin Abs, IgA: 7 U (ref 0–19)
IgA/Immunoglobulin A, Serum: 554 mg/dL — ABNORMAL HIGH (ref 87–352)

## 2023-07-26 NOTE — Telephone Encounter (Signed)
Patient notified.  Notify patient labs show:  1.  Negative celiac labs.  No gluten allergy.  2.  Normal lipase.  No evidence of pancreatitis.  3.  Normal TSH thyroid test.  4.  CBC shows normal white count.  No infection.  Hide normal hemoglobin, not worrisome.  No evidence of anemia.  5.  Glucose is mildly elevated at 172.  Follow-up with PCP for diabetes.  Normal kidney function.  1 liver test (ALT) is slightly elevated, yet improved from labs 1 year ago.  All other liver tests are normal.  6.  Nothing worrisome with blood work.  Continue with plan to do stool studies and keep follow-up OV as scheduled.

## 2023-08-01 NOTE — Progress Notes (Signed)
Celso Amy, PA-C [address]  [address]  [place], [place] [address]  Main: [phone]  Fax: [phone]   Primary Care Physician: System, Provider Not In  Primary Gastroenterologist:  Celso Amy, PA-C / Dr. Wyline Mood    CC: Follow-up diarrhea, schedule colonoscopy  HPI: Kendra Herring is a 62 y.o. female returns for follow-up of chronic diarrhea.  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  Triggered by stress.  Previous cholecystectomy in 2023.  Treated for cervical cancer with chemo and radiation 3 years ago.  Worsening diarrhea for 3 years.  Stool studies (GI pathogen panel, C. difficile, fecal calprotectin) were completed and turned into [facility] [address] lab 07/24/2023.  We still have no results.  We called [facility] today and stool sample was never received or tested.  We are repeating stool studies today.  Labs 07/24/2023 showed negative celiac panel, normal TSH, lipase, and CBC.  Mildly elevated glucose 172.    She is age 62 and has never had a colonoscopy.  Current Outpatient Medications  Medication Sig Dispense Refill   empagliflozin (JARDIANCE) 25 MG TABS tablet Take 25 mg by mouth daily.     Na Sulfate-K Sulfate-Mg Sulf 17.5-3.13-1.6 GM/177ML SOLN Take 1 Box by mouth once for 1 dose. At 5 PM the day before your procedure pour the contents of one bottle of Suprep into the mixing container provided.  Fill the container, with ice cold water, up to the 16 oz fill line, and drink the entire amount. Then 5 hours before procedure pour the contents of the second bottle of Suprep into the mixing container provided and follow the same instructions. 354 mL 0   OZEMPIC, 1 MG/DOSE, 4 MG/3ML SOPN Inject 1 mg into the skin once a week.     No current facility-administered medications for this visit.    Allergies as of 08/02/2023 - Review Complete 08/02/2023  Allergen Reaction Noted   Pseudoephedrine Palpitations 07/04/2017    Past Medical History:   Diagnosis Date   Anxiety    Arrhythmia    Arthritis    Asthma    Cervical cancer (HCC) 03/2017   Rad, chemo and brachytherapy tx's.    Diabetes mellitus without complication (HCC)    Hernia of abdominal wall 04/17/2017   Personal history of chemotherapy    Personal history of radiation therapy    cervical cancer    Past Surgical History:  Procedure Laterality Date   CHOLECYSTECTOMY  2023   COLONOSCOPY WITH PROPOFOL N/A 06/11/2023   Procedure: COLONOSCOPY WITH PROPOFOL;  Surgeon: Wyline Mood, MD;  Location: [facility] ENDOSCOPY;  Service: Gastroenterology;  Laterality: N/A;   HIP SURGERY     TUBAL LIGATION      Review of Systems:    All systems reviewed and negative except where noted in HPI.   Physical Examination:   BP 124/77   Pulse 88   Temp 97.9 F (36.6 C) (Oral)   Ht 4\' 11"  (1.499 m)   Wt 140 lb 4 oz (63.6 kg)   BMI 28.33 kg/m   General: Well-nourished, well-developed in no acute distress.  Lungs: Clear to auscultation bilaterally. Non-labored. Heart: Regular rate and rhythm, no murmurs rubs or gallops.  Abdomen: Bowel sounds are normal; Abdomen is Soft; No hepatosplenomegaly, masses or hernias;  No Abdominal Tenderness; No guarding or rebound tenderness. Neuro: Alert and oriented x 3.  Grossly intact.  Psych: Alert and cooperative, normal mood and affect.   Imaging Studies:  No results found.  Assessment and Plan:   Kendra Herring is a 62 y.o. y/o female returns for follow-up of:  Diarrhea Stool Studies: GI Pathogen Panal, C. Difficile Toxin PCR, Fecal Calprotectin  Scheduling Colonoscopy I discussed risks of colonoscopy with patient to include risk of bleeding, colon perforation, and risk of sedation.  Patient expressed understanding and agrees to proceed with colonoscopy.  SuPrep.  Celso Amy, PA-C  Follow up 4 weeks after colonoscopy with TG.

## 2023-08-02 ENCOUNTER — Encounter: Payer: Self-pay | Admitting: Physician Assistant

## 2023-08-02 ENCOUNTER — Telehealth: Payer: Self-pay | Admitting: Physician Assistant

## 2023-08-02 ENCOUNTER — Telehealth: Payer: Self-pay

## 2023-08-02 ENCOUNTER — Ambulatory Visit: Payer: Medicare HMO | Admitting: Physician Assistant

## 2023-08-02 VITALS — BP 124/77 | HR 88 | Temp 97.9°F | Ht 59.0 in | Wt 140.2 lb

## 2023-08-02 DIAGNOSIS — R197 Diarrhea, unspecified: Secondary | ICD-10-CM

## 2023-08-02 MED ORDER — NA SULFATE-K SULFATE-MG SULF 17.5-3.13-1.6 GM/177ML PO SOLN: 1.0000 | Freq: Once | ORAL | 0 refills | Status: AC

## 2023-08-02 NOTE — Telephone Encounter (Signed)
Patient called in to let us know she was going to be late for her 10:15 am appointment she had to go back home for emergency. I informed her that her appointment is not until 11:15 am so she has time.

## 2023-08-02 NOTE — Telephone Encounter (Signed)
Called [facility] to find out why patient stool tests were not resulted because patient states she turn in stool test to the [facility] on [address] inside the [place] on 07/24/2023. Called [facility] and they said they only see patient did blood work and not stool test. She states she will send a investigation up to higher up so they can investigation on what happen to the stool test. Gave the patient the stool containers again and informed her this information. Informed her to bring the stool kits back to our office and gave her the lab hours

## 2023-08-08 ENCOUNTER — Telehealth: Payer: Self-pay

## 2023-08-08 ENCOUNTER — Telehealth: Payer: Self-pay | Admitting: Physician Assistant

## 2023-08-08 LAB — GI PROFILE, STOOL, PCR

## 2023-08-08 LAB — CALPROTECTIN, FECAL: Calprotectin, Fecal: 11 ug/g (ref 0–120)

## 2023-08-08 MED ORDER — VANCOMYCIN HCL 125 MG PO CAPS: 125.0000 mg | ORAL_CAPSULE | Freq: Four times a day (QID) | ORAL | 0 refills | Status: DC

## 2023-08-08 NOTE — Telephone Encounter (Signed)
Spoke with patient and she will use [facility] to help with the cost of Vancomycin.

## 2023-08-08 NOTE — Telephone Encounter (Signed)
Patient called in because she is unable to get her medication due to her insurance will not pay for it.

## 2023-08-08 NOTE — Telephone Encounter (Signed)
Patient calls office at this time- she stated Medicare will not cover Vancomycin- Please advise.

## 2023-08-08 NOTE — Progress Notes (Signed)
Call and notify patient stool tests show normal fecal calprotectin.  No evidence of inflammatory bowel disease.  GI pathogen panel was positive for C. difficile.  Negative for all other infections.  Please treat with Rx Vancomycin 125 mg 1 tablet 4 times daily x 14 days, #56, no refills.  Call back with update on diarrhea symptoms in 1 or 2 weeks.

## 2023-08-08 NOTE — Telephone Encounter (Signed)
Spoke with patient-she will call back in 2 weeks to provide update. Vancomycin sent to pharmacy.   Call and notify patient stool tests show normal fecal calprotectin.  No evidence of inflammatory bowel disease.  GI pathogen panel was positive for C. difficile.  Negative for all other infections.  Please treat with Rx Vancomycin 125 mg 1 tablet 4 times daily x 14 days, #56, no refills.  Call back with update on diarrhea symptoms in 1 or 2 weeks.

## 2023-08-08 NOTE — Telephone Encounter (Signed)
Patient called to get the name of the medication, the MG and get the of the [facility]. I printed the coupon off for her and put in the basket for her to put up.

## 2023-08-08 NOTE — Telephone Encounter (Signed)
Patient called in to get the name of the medication.

## 2023-08-09 ENCOUNTER — Telehealth: Payer: Self-pay | Admitting: Physician Assistant

## 2023-08-09 ENCOUNTER — Other Ambulatory Visit: Payer: Self-pay

## 2023-08-09 MED ORDER — VANCOMYCIN HCL 125 MG PO CAPS: 125.0000 mg | ORAL_CAPSULE | Freq: Four times a day (QID) | ORAL | 0 refills | Status: DC

## 2023-08-09 NOTE — Telephone Encounter (Signed)
Pt requesting call back having medication issues

## 2023-08-09 NOTE — Telephone Encounter (Signed)
Patient left another message at 10/17am stating she needs to get her medicine called in from the pharmacy and we need to call it in.   Called patient and she states she needs the Vancomycin sent [facility] [place] because they have the medication is stock and it is cheaper at the pharmacy. [facility] [place] and patient states she will go to the pharmacy to pick up the medication.

## 2023-08-09 NOTE — Telephone Encounter (Signed)
Patient left a message at 8:42am stating that patient states the medication is 400 dollars and she can not afford the medication. She states she will need a alternative called in for her. The medication is Vancomycin

## 2023-08-09 NOTE — Addendum Note (Signed)
Addended by: Radene Knee L on: 08/09/2023 10:51 AM   Modules accepted: Orders

## 2023-09-06 ENCOUNTER — Encounter: Payer: Self-pay | Admitting: Gastroenterology

## 2023-09-12 ENCOUNTER — Ambulatory Visit: Payer: Self-pay

## 2023-09-12 ENCOUNTER — Ambulatory Visit: Payer: Medicare Other

## 2023-09-13 ENCOUNTER — Encounter: Payer: Self-pay | Admitting: Gastroenterology

## 2023-09-13 ENCOUNTER — Emergency Department: Payer: Medicare HMO

## 2023-09-13 ENCOUNTER — Ambulatory Visit: Payer: Medicare HMO | Admitting: Registered Nurse

## 2023-09-13 ENCOUNTER — Encounter
Admission: RE | Disposition: A | Discharge status: Home / Self Care | Payer: Self-pay | Source: Home / Self Care | Attending: Gastroenterology

## 2023-09-13 ENCOUNTER — Encounter: Payer: Self-pay | Admitting: Emergency Medicine

## 2023-09-13 ENCOUNTER — Ambulatory Visit
Admission: RE | Admit: 2023-09-13 | Discharge: 2023-09-13 | Disposition: A | Discharge status: Home / Self Care | Payer: Medicare HMO | Attending: Gastroenterology | Admitting: Gastroenterology

## 2023-09-13 ENCOUNTER — Other Ambulatory Visit: Payer: Self-pay

## 2023-09-13 ENCOUNTER — Emergency Department
Admission: EM | Admit: 2023-09-13 | Discharge: 2023-09-13 | Disposition: A | Discharge status: Home / Self Care | Payer: Medicare HMO | Source: Home / Self Care | Attending: Emergency Medicine | Admitting: Emergency Medicine

## 2023-09-13 DIAGNOSIS — E119 Type 2 diabetes mellitus without complications: Secondary | ICD-10-CM | POA: Diagnosis not present

## 2023-09-13 DIAGNOSIS — Z7985 Long-term (current) use of injectable non-insulin antidiabetic drugs: Secondary | ICD-10-CM | POA: Diagnosis not present

## 2023-09-13 DIAGNOSIS — E876 Hypokalemia: Secondary | ICD-10-CM | POA: Insufficient documentation

## 2023-09-13 DIAGNOSIS — J45909 Unspecified asthma, uncomplicated: Secondary | ICD-10-CM | POA: Insufficient documentation

## 2023-09-13 DIAGNOSIS — K625 Hemorrhage of anus and rectum: Secondary | ICD-10-CM

## 2023-09-13 DIAGNOSIS — K6389 Other specified diseases of intestine: Secondary | ICD-10-CM

## 2023-09-13 DIAGNOSIS — Z833 Family history of diabetes mellitus: Secondary | ICD-10-CM | POA: Diagnosis not present

## 2023-09-13 DIAGNOSIS — K529 Noninfective gastroenteritis and colitis, unspecified: Secondary | ICD-10-CM | POA: Insufficient documentation

## 2023-09-13 DIAGNOSIS — R1084 Generalized abdominal pain: Secondary | ICD-10-CM

## 2023-09-13 DIAGNOSIS — M1990 Unspecified osteoarthritis, unspecified site: Secondary | ICD-10-CM | POA: Insufficient documentation

## 2023-09-13 DIAGNOSIS — R197 Diarrhea, unspecified: Secondary | ICD-10-CM

## 2023-09-13 DIAGNOSIS — D122 Benign neoplasm of ascending colon: Secondary | ICD-10-CM | POA: Diagnosis not present

## 2023-09-13 DIAGNOSIS — D126 Benign neoplasm of colon, unspecified: Secondary | ICD-10-CM

## 2023-09-13 DIAGNOSIS — Z7984 Long term (current) use of oral hypoglycemic drugs: Secondary | ICD-10-CM | POA: Diagnosis not present

## 2023-09-13 HISTORY — PX: BIOPSY: SHX5522

## 2023-09-13 HISTORY — PX: POLYPECTOMY: SHX5525

## 2023-09-13 HISTORY — PX: COLONOSCOPY WITH PROPOFOL: SHX5780

## 2023-09-13 LAB — CBC
HCT: 48 % — ABNORMAL HIGH (ref 36.0–46.0)
Hemoglobin: 16.3 g/dL — ABNORMAL HIGH (ref 12.0–15.0)
MCH: 31.2 pg (ref 26.0–34.0)
MCHC: 34 g/dL (ref 30.0–36.0)
MCV: 91.8 fL (ref 80.0–100.0)
Platelets: 202 10*3/uL (ref 150–400)
RBC: 5.23 MIL/uL — ABNORMAL HIGH (ref 3.87–5.11)
RDW: 12.2 % (ref 11.5–15.5)
WBC: 10.9 10*3/uL — ABNORMAL HIGH (ref 4.0–10.5)
nRBC: 0 % (ref 0.0–0.2)

## 2023-09-13 LAB — COMPREHENSIVE METABOLIC PANEL
ALT: 42 U/L (ref 0–44)
AST: 36 U/L (ref 15–41)
Albumin: 4.3 g/dL (ref 3.5–5.0)
Alkaline Phosphatase: 77 U/L (ref 38–126)
Anion gap: 14 (ref 5–15)
BUN: 12 mg/dL (ref 8–23)
CO2: 26 mmol/L (ref 22–32)
Calcium: 8.8 mg/dL — ABNORMAL LOW (ref 8.9–10.3)
Chloride: 102 mmol/L (ref 98–111)
Creatinine, Ser: 0.52 mg/dL (ref 0.44–1.00)
GFR, Estimated: >60 mL/min (ref >60)
Glucose, Bld: 179 mg/dL — ABNORMAL HIGH (ref 70–99)
Potassium: 2.8 mmol/L — ABNORMAL LOW (ref 3.5–5.1)
Sodium: 142 mmol/L (ref 135–145)
Total Bilirubin: 1.1 mg/dL (ref <1.2)
Total Protein: 7.8 g/dL (ref 6.5–8.1)

## 2023-09-13 LAB — LIPASE, BLOOD: Lipase: 23 U/L (ref 11–51)

## 2023-09-13 LAB — GLUCOSE, CAPILLARY: Glucose-Capillary: 127 mg/dL — ABNORMAL HIGH (ref 70–99)

## 2023-09-13 LAB — MAGNESIUM: Magnesium: 2.1 mg/dL (ref 1.7–2.4)

## 2023-09-13 SURGERY — COLONOSCOPY WITH PROPOFOL
Anesthesia: General

## 2023-09-13 MED ORDER — MORPHINE SULFATE (PF) 4 MG/ML IV SOLN
4.0000 mg | Freq: Once | INTRAVENOUS | Status: AC
  Administered 2023-09-13: 4 mg via INTRAVENOUS
  Filled 2023-09-13: qty 1

## 2023-09-13 MED ORDER — SODIUM CHLORIDE 0.9 % IV BOLUS
1000.0000 mL | Freq: Once | INTRAVENOUS | Status: AC
  Administered 2023-09-13: 1000 mL via INTRAVENOUS

## 2023-09-13 MED ORDER — OXYCODONE HCL 5 MG PO TABS: 5.0000 mg | ORAL_TABLET | Freq: Three times a day (TID) | ORAL | 0 refills | Status: AC | PRN

## 2023-09-13 MED ORDER — SODIUM CHLORIDE 0.9 % IV SOLN: INTRAVENOUS | Status: DC

## 2023-09-13 MED ORDER — PROPOFOL 10 MG/ML IV BOLUS
INTRAVENOUS | Status: DC | PRN
  Administered 2023-09-13: 50 mg via INTRAVENOUS
  Administered 2023-09-13: 30 mg via INTRAVENOUS

## 2023-09-13 MED ORDER — POTASSIUM CHLORIDE 10 MEQ/100ML IV SOLN
10.0000 meq | INTRAVENOUS | Status: AC
  Administered 2023-09-13 (×2): 10 meq via INTRAVENOUS
  Filled 2023-09-13 (×2): qty 100

## 2023-09-13 MED ORDER — PROPOFOL 1000 MG/100ML IV EMUL
INTRAVENOUS | Status: AC
  Filled 2023-09-13: qty 100

## 2023-09-13 MED ORDER — LIDOCAINE HCL (CARDIAC) PF 100 MG/5ML IV SOSY
PREFILLED_SYRINGE | INTRAVENOUS | Status: DC | PRN
  Administered 2023-09-13: 50 mg via INTRAVENOUS

## 2023-09-13 MED ORDER — OXYCODONE HCL 5 MG PO TABS
5.0000 mg | ORAL_TABLET | Freq: Once | ORAL | Status: AC
  Administered 2023-09-13: 5 mg via ORAL
  Filled 2023-09-13: qty 1

## 2023-09-13 MED ORDER — PROPOFOL 500 MG/50ML IV EMUL
INTRAVENOUS | Status: DC | PRN
  Administered 2023-09-13: 100 ug/kg/min via INTRAVENOUS

## 2023-09-13 MED ORDER — POTASSIUM CHLORIDE CRYS ER 20 MEQ PO TBCR
60.0000 meq | EXTENDED_RELEASE_TABLET | Freq: Once | ORAL | Status: AC
  Administered 2023-09-13: 60 meq via ORAL
  Filled 2023-09-13: qty 3

## 2023-09-13 MED ORDER — DEXMEDETOMIDINE HCL IN NACL 80 MCG/20ML IV SOLN
INTRAVENOUS | Status: DC | PRN
  Administered 2023-09-13: 8 ug via INTRAVENOUS

## 2023-09-13 NOTE — H&P (Signed)
Wyline Mood, MD [address], [place], [place], [address] [address], [address], [place], [place], [address] Phone: [phone]  Fax: [phone]  Primary Care Physician:  System, Provider Not In   Pre-Procedure History & Physical: HPI:  Kendra Herring is a 62 y.o. female is here for an colonoscopy.   Past Medical History:  Diagnosis Date   Anxiety    Arrhythmia    Arthritis    Asthma    Cervical cancer (HCC) 03/2017   Rad, chemo and brachytherapy tx's.    Diabetes mellitus without complication (HCC)    Hernia of abdominal wall 04/17/2017   Personal history of chemotherapy    Personal history of radiation therapy    cervical cancer    Past Surgical History:  Procedure Laterality Date   CHOLECYSTECTOMY  2023   COLONOSCOPY WITH PROPOFOL N/A 06/11/2023   Procedure: COLONOSCOPY WITH PROPOFOL;  Surgeon: Wyline Mood, MD;  Location: [facility] ENDOSCOPY;  Service: Gastroenterology;  Laterality: N/A;   HIP SURGERY     JOINT REPLACEMENT     TUBAL LIGATION      Prior to Admission medications   Medication Sig Start Date End Date Taking? Authorizing Provider  empagliflozin (JARDIANCE) 25 MG TABS tablet Take 25 mg by mouth daily.    [provider]  OZEMPIC, 1 MG/DOSE, 4 MG/3ML SOPN Inject 1 mg into the skin once a week. 03/26/23   [provider]  vancomycin (VANCOCIN) 125 MG capsule Take 1 capsule (125 mg total) by mouth 4 (four) times daily. 08/09/23   Celso Amy, PA-C    Allergies as of 08/02/2023 - Review Complete 08/02/2023  Allergen Reaction Noted   Pseudoephedrine Palpitations 07/04/2017    Family History  Problem Relation Age of Onset   Diabetes Mother    Breast cancer Neg Hx     Social History   Socioeconomic History   Marital status: Divorced    Spouse name: Not on file   Number of children: Not on file   Years of education: Not on file   Highest education level: Not on file  Occupational History   Not on file  Tobacco  Use   Smoking status: Never   Smokeless tobacco: Never  Vaping Use   Vaping status: Never Used  Substance and Sexual Activity   Alcohol use: No   Drug use: No   Sexual activity: Not Currently    Birth control/protection: None  Other Topics Concern   Not on file  Social History Narrative   Not on file   Social Determinants of Health   Financial Resource Strain: Not on file  Food Insecurity: Not on file  Transportation Needs: Not on file  Physical Activity: Not on file  Stress: Not on file  Social Connections: Not on file  Intimate Partner Violence: Not on file    Review of Systems: See HPI, otherwise negative ROS  Physical Exam: BP 134/75   Pulse 82   Temp (!) 97.5 F (36.4 C) (Temporal)   Resp 16   Wt 63.6 kg   SpO2 98%   BMI 28.32 kg/m  General:   Alert,  pleasant and cooperative in NAD Head:  Normocephalic and atraumatic. Neck:  Supple; no masses or thyromegaly. Lungs:  Clear throughout to auscultation, normal respiratory effort.    Heart:  +S1, +S2, Regular rate and rhythm, No edema. Abdomen:  Soft, nontender and nondistended. Normal bowel sounds, without guarding, and without rebound.   Neurologic:  Alert  and  oriented x4;  grossly normal neurologically.  Impression/Plan: Kendra Herring is here for an colonoscopy to be performed for diarrhea Risks, benefits, limitations, and alternatives regarding  colonoscopy have been reviewed with the patient.  Questions have been answered.  All parties agreeable.   Wyline Mood, MD  09/13/2023, 7:51 AM\

## 2023-09-13 NOTE — Anesthesia Preprocedure Evaluation (Addendum)
Anesthesia Evaluation  Patient identified by MRN, date of birth, ID band Patient awake    Reviewed: Allergy & Precautions, NPO status , Patient's Chart, lab work & pertinent test results  History of Anesthesia Complications Negative for: history of anesthetic complications  Airway Mallampati: II  TM Distance: >3 FB Neck ROM: full    Dental  (+) Chipped,    Pulmonary asthma    Pulmonary exam normal        Cardiovascular negative cardio ROS Normal cardiovascular exam     Neuro/Psych  PSYCHIATRIC DISORDERS Anxiety     negative neurological ROS     GI/Hepatic negative GI ROS, Neg liver ROS,,,  Endo/Other  diabetes, Type 2, Oral Hypoglycemic Agents    Renal/GU negative Renal ROS  negative genitourinary   Musculoskeletal  (+) Arthritis ,    Abdominal   Peds  Hematology negative hematology ROS (+)   Anesthesia Other Findings Past Medical History: No date: Anxiety No date: Arrhythmia No date: Arthritis No date: Asthma 03/2017: Cervical cancer (HCC)     Comment:  Rad, chemo and brachytherapy tx's.  No date: Diabetes mellitus without complication (HCC) 04/17/2017: Hernia of abdominal wall No date: Personal history of chemotherapy No date: Personal history of radiation therapy     Comment:  cervical cancer  Past Surgical History: 2023: CHOLECYSTECTOMY 06/11/2023: COLONOSCOPY WITH PROPOFOL; N/A     Comment:  Procedure: COLONOSCOPY WITH PROPOFOL;  Surgeon: Wyline Mood, MD;  Location: [facility] ENDOSCOPY;  Service:               Gastroenterology;  Laterality: N/A; No date: HIP SURGERY No date: JOINT REPLACEMENT No date: TUBAL LIGATION  BMI    Body Mass Index: 28.32 kg/m      Reproductive/Obstetrics negative OB ROS                             Anesthesia Physical Anesthesia Plan  ASA: 2  Anesthesia Plan: General   Post-op Pain Management: Minimal or no pain anticipated    Induction: Intravenous  PONV Risk Score and Plan: 2 and Propofol infusion and TIVA  Airway Management Planned: Natural Airway and Nasal Cannula  Additional Equipment:   Intra-op Plan:   Post-operative Plan:   Informed Consent: I have reviewed the patients History and Physical, chart, labs and discussed the procedure including the risks, benefits and alternatives for the proposed anesthesia with the patient or authorized representative who has indicated his/her understanding and acceptance.     Dental Advisory Given  Plan Discussed with: Anesthesiologist, CRNA and Surgeon  Anesthesia Plan Comments: (Patient consented for risks of anesthesia including but not limited to:  - adverse reactions to medications - risk of airway placement if required - damage to eyes, teeth, lips or other oral mucosa - nerve damage due to positioning  - sore throat or hoarseness - Damage to heart, brain, nerves, lungs, other parts of body or loss of life  Patient voiced understanding and assent.)       Anesthesia Quick Evaluation

## 2023-09-13 NOTE — Op Note (Signed)
[facility] Gastroenterology Patient Name: Kendra Herring Procedure Date: 09/13/2023 7:48 AM MRN: [id number] Account #: [id number] Date of Birth: 08-18-1961 Admit Type: Outpatient Age: 62 Room: [facility] ENDO ROOM 2 Gender: Female Note Status: Finalized Instrument Name: Nelda Marseille [id number] Procedure:             Colonoscopy Indications:           Chronic diarrhea Providers:             Wyline Mood MD, MD Referring MD:          Wyline Mood MD, MD (Referring MD), No Local Md, MD                         (Referring MD) Medicines:             Monitored Anesthesia Care Complications:         No immediate complications. Procedure:             Pre-Anesthesia Assessment:                        - Prior to the procedure, a History and Physical was                         performed, and patient medications, allergies and                         sensitivities were reviewed. The patient's tolerance                         of previous anesthesia was reviewed.                        - The risks and benefits of the procedure and the                         sedation options and risks were discussed with the                         patient. All questions were answered and informed                         consent was obtained.                        - ASA Grade Assessment: II - A patient with mild                         systemic disease.                        After obtaining informed consent, the colonoscope was                         passed under direct vision. Throughout the procedure,                         the patient's blood pressure, pulse, and oxygen                         saturations were monitored continuously.  The                         Colonoscope was introduced through the anus and                         advanced to the the cecum, identified by the                         appendiceal orifice. The colonoscopy was performed                         with ease. The patient  tolerated the procedure well.                         The quality of the bowel preparation was inadequate. Findings:      Inflammation characterized by congestion (edema), erythema, loss of       vascularity and mucus was found in a continuous and circumferential       pattern from the rectum to the cecum. The proximal ascending colon was       spared. The inflammation was graded as Mayo Score 1 (mild, with       erythema, decreased vascular pattern, mild friability). Biopsies were       taken with a cold forceps for histology.      A 7 mm polyp was found in the ascending colon. The polyp was sessile.       The polyp was removed with a cold snare. Resection and retrieval were       complete. Impression:            - Preparation of the colon was inadequate.                        - Colitis. Inflammation was found from the rectum to                         the cecum. This was graded as Mayo Score 1 (mild                         disease). Biopsied.                        - One 7 mm polyp in the ascending colon, removed with                         a cold snare. Resected and retrieved. Recommendation:        - Discharge patient to home (with escort).                        - Resume previous diet.                        - Continue present medications.                        - Await pathology results.                        - Repeat colonoscopy in 8 months because the bowel  preparation was suboptimal.                        - Return to GI office as previously scheduled. Procedure Code(s):     --- Professional ---                        [phone], Colonoscopy, flexible; with removal of                         tumor(s), polyp(s), or other lesion(s) by snare                         technique                        45380, 59, Colonoscopy, flexible; with biopsy, single                         or multiple Diagnosis Code(s):     --- Professional ---                         K52.9, Noninfective gastroenteritis and colitis,                         unspecified                        D12.2, Benign neoplasm of ascending colon CPT copyright 2022 American Medical Association. All rights reserved. The codes documented in this report are preliminary and upon coder review may  be revised to meet current compliance requirements. Wyline Mood, MD Wyline Mood MD, MD 09/13/2023 8:07:34 AM This report has been signed electronically. Number of Addenda: 0 Note Initiated On: 09/13/2023 7:48 AM Scope Withdrawal Time: 0 hours 8 minutes 20 seconds  Total Procedure Duration: 0 hours 10 minutes 45 seconds  Estimated Blood Loss:  Estimated blood loss: none.      [facility]

## 2023-09-13 NOTE — ED Triage Notes (Signed)
Pt BIB [facility] from home.  Had colonoscopy this morning.  Got home and had one bowel movement with blood in it and then began having severe diffuse abdominal pain. No nausea or vomiting.

## 2023-09-13 NOTE — Anesthesia Postprocedure Evaluation (Signed)
Anesthesia Post Note  Patient: ALYZAH TREVINO  Procedure(s) Performed: COLONOSCOPY WITH PROPOFOL BIOPSY POLYPECTOMY  Patient location during evaluation: Endoscopy Anesthesia Type: General Level of consciousness: awake and alert Pain management: pain level controlled Vital Signs Assessment: post-procedure vital signs reviewed and stable Respiratory status: spontaneous breathing, nonlabored ventilation, respiratory function stable and patient connected to nasal cannula oxygen Cardiovascular status: blood pressure returned to baseline and stable Postop Assessment: no apparent nausea or vomiting Anesthetic complications: no   No notable events documented.   Last Vitals:  Vitals:   09/13/23 0812 09/13/23 0813  BP:  103/60  Pulse:  79  Resp:  16  Temp: (!) 36.3 C   SpO2:  100%    Last Pain:  Vitals:   09/13/23 0832  TempSrc:   PainSc: 0-No pain                 Louie Boston

## 2023-09-13 NOTE — ED Provider Notes (Addendum)
[facility] Provider Note    Event Date/Time   First MD Initiated Contact with Patient 09/13/23 1735     (approximate)   History   Abdominal Pain   HPI  Kendra Herring is a 62 y.o. female   Past medical history of diabetes, anxiety, who presents to Emergency Department with lower GI bleeding and abdominal pain after colonoscopy performed earlier this morning.  Had felt well going into the colonoscopy and had an episode of blood in the stools as well as diffuse abdominal pain after the procedure.  She does not take blood thinners.   External Medical Documents Reviewed: Dr. Tobi Bastos H&P from gastroenterology earlier today for colonoscopy for diarrhea, procedure note tells of inadequate prep, signs of colitis, and 1 polyp was removed.      Physical Exam   Triage Vital Signs: ED Triage Vitals  Encounter Vitals Group     BP 09/13/23 1734 (!) 142/72     Systolic BP Percentile --      Diastolic BP Percentile --      Pulse Rate 09/13/23 1734 (!) 121     Resp 09/13/23 1734 20     Temp 09/13/23 1734 98.2 F (36.8 C)     Temp Source 09/13/23 1734 Oral     SpO2 09/13/23 1734 96 %     Weight --      Height --      Head Circumference --      Peak Flow --      Pain Score 09/13/23 1732 10     Pain Loc --      Pain Education --      Exclude from Growth Chart --     Most recent vital signs: Vitals:   09/13/23 1734 09/13/23 1830  BP: (!) 142/72 124/69  Pulse: (!) 121 (!) 113  Resp: 20 (!) 22  Temp: 98.2 F (36.8 C)   SpO2: 96% 93%    General: Awake, no distress.  CV:  Good peripheral perfusion.  Resp:  Normal effort.  Abd:  No distention.  Other:  Uncomfortable appearing.  Tachycardic.  Normotensive.  Diffuse abdominal tenderness without rigidity or guarding.   ED Results / Procedures / Treatments   Labs (all labs ordered are listed, but only abnormal results are displayed) Labs Reviewed  COMPREHENSIVE METABOLIC PANEL - Abnormal; Notable for  the following components:      Result Value   Potassium 2.8 (*)    Glucose, Bld 179 (*)    Calcium 8.8 (*)    All other components within normal limits  CBC - Abnormal; Notable for the following components:   WBC 10.9 (*)    RBC 5.23 (*)    Hemoglobin 16.3 (*)    HCT 48.0 (*)    All other components within normal limits  C DIFFICILE QUICK SCREEN W PCR REFLEX    GASTROINTESTINAL PANEL BY PCR, STOOL (REPLACES STOOL CULTURE)  LIPASE, BLOOD  MAGNESIUM  URINALYSIS, ROUTINE W REFLEX MICROSCOPIC     I ordered and reviewed the above labs they are notable for hypokalemia 2.8, mildly elevated white blood cell count of 10.9    RADIOLOGY I independently reviewed and interpreted chest x-ray and see no obvious free air below the diaphragm I also reviewed radiologist's formal read.   PROCEDURES:  Critical Care performed: No  Procedures   MEDICATIONS ORDERED IN ED: Medications  potassium chloride 10 mEq in 100 mL IVPB (0 mEq Intravenous Stopped 09/13/23 1942)  oxyCODONE (Oxy IR/ROXICODONE) immediate release tablet 5 mg (has no administration in time range)  sodium chloride 0.9 % bolus 1,000 mL (1,000 mLs Intravenous New Bag/Given 09/13/23 1808)  morphine (PF) 4 MG/ML injection 4 mg (4 mg Intravenous Given 09/13/23 1747)  potassium chloride SA (KLOR-CON M) CR tablet 60 mEq (60 mEq Oral Given 09/13/23 1840)     IMPRESSION / MDM / ASSESSMENT AND PLAN / ED COURSE  I reviewed the triage vital signs and the nursing notes.                                Patient's presentation is most consistent with acute presentation with potential threat to life or bodily function.  Differential diagnosis includes, but is not limited to, colitis, postoperative bleeding from polyp removal, perforation   The patient is on the cardiac monitor to evaluate for evidence of arrhythmia and/or significant heart rate changes.  MDM: This is a patient with post colonoscopy pain and rectal bleeding, got the  colonoscopy in the first place for diarrheal illness, signs of colitis from colonoscopy and 1 polyp removed.  Could be ongoing pain from her colitis/diarrheal illness, bleeding from polyp removal, but should check for signs of perforation with CT scan.  IV morphine given as well as fluids.  --  Tachycardia improving after fluids/pain control now in low 100, pain much better, CT scan with no evidence of perforation, evidence of colitis for which the patient has had for quite some time now which prompted her colonoscopy in the first place.  I spoke with Dr. Tobi Bastos her GI on the phone, we will attempt to collect a stool sample and check C. difficile if she is able to provide 1 today.  Otherwise some degree of bleeding may be expected after her polyps were removed earlier today.  Patient unable to provide stool sample has had no bowel movement since being in the emergency department.  She continues to have abdominal pain albeit better with pain medications, remains tachycardic albeit better than before.  Given her ongoing pain and tachycardia, I offered admission for further pain control, observation for new lower GI bleeding, but patient declines at this time stating that she would feel better if she were to rest at home, and understands that she should come back to the emergency department should she develop any new or worsening symptoms.        FINAL CLINICAL IMPRESSION(S) / ED DIAGNOSES   Final diagnoses:  Generalized abdominal pain  Rectal bleeding  Hypokalemia     Rx / DC Orders   ED Discharge Orders     None        Note:  This document was prepared using Dragon voice recognition software and may include unintentional dictation errors.    Pilar Jarvis, MD 09/13/23 Willaim Sheng    Pilar Jarvis, MD 09/13/23 2157

## 2023-09-13 NOTE — Anesthesia Procedure Notes (Addendum)
Procedure Name: MAC Date/Time: 09/13/2023 7:53 AM  Performed by: Lily Lovings, CRNAPre-anesthesia Checklist: Patient identified, Emergency Drugs available, Suction available and Patient being monitored Patient Re-evaluated:Patient Re-evaluated prior to induction Oxygen Delivery Method: Simple face mask Preoxygenation: Pre-oxygenation with 100% oxygen Induction Type: IV induction Comments: POM

## 2023-09-13 NOTE — Discharge Instructions (Addendum)
Take acetaminophen 650 mg and ibuprofen 400 mg every 6 hours for pain.  Take with food. Take oxycodone as prescribed for severe pain. Stay well-hydrated by drinking plenty of fluids.  Talk to your GI doctor and your primary doctor for a follow-up appointment.  Your potassium was slightly low and you got extra potassium supplements in the emergency department, see the attached document regarding increasing potassium in your diet and have your doctor recheck these levels within the next 1 to 2 weeks.  Thank you for choosing [place] for your health care today!  Please see your primary doctor this week for a follow up appointment.   If you have any new, worsening, or unexpected symptoms call your doctor right away or come back to the emergency department for reevaluation.  It was my pleasure to care for you today.   Daneil Dan Modesto Charon, MD

## 2023-09-13 NOTE — Transfer of Care (Signed)
Immediate Anesthesia Transfer of Care Note  Patient: Kendra Herring  Procedure(s) Performed: COLONOSCOPY WITH PROPOFOL BIOPSY POLYPECTOMY  Patient Location: PACU  Anesthesia Type:General  Level of Consciousness: drowsy and patient cooperative  Airway & Oxygen Therapy: Patient Spontanous Breathing  Post-op Assessment: Report given to RN and Patient moving all extremities  Post vital signs: Reviewed and stable  Last Vitals:  Vitals Value Taken Time  BP 103/60 09/13/23 0813  Temp    Pulse 84 09/13/23 0813  Resp 18 09/13/23 0813  SpO2 91 % 09/13/23 0813  Vitals shown include unfiled device data.  Last Pain:  Vitals:   09/13/23 0711  TempSrc: Temporal         Complications: No notable events documented.

## 2023-09-14 ENCOUNTER — Ambulatory Visit: Payer: Medicare HMO | Admitting: Nurse Practitioner

## 2023-09-14 ENCOUNTER — Encounter: Payer: Self-pay | Admitting: Gastroenterology

## 2023-09-14 LAB — SURGICAL PATHOLOGY

## 2023-09-17 ENCOUNTER — Encounter: Payer: Self-pay | Admitting: Gastroenterology

## 2023-09-18 ENCOUNTER — Telehealth: Payer: Self-pay | Admitting: Physician Assistant

## 2023-09-18 NOTE — Telephone Encounter (Signed)
Phone F/U:  I called to check on patient.  She had colonoscopy 09/13/23 by Dr. Tobi Bastos and was diagnosed with Pan-Colitis.  Biopsies did NOT show Inflammatory Bowel Disease.  She had worsening Abd Pain, Diarrhea, and Rectal bleeding after her colonoscopy and went to [facility] ED 09/13/23.  Potassium was low 2.8 (replaced). Glucose 179, WBC 10, Hgb 16g.  CT scan showed Decscending Colon Coilitis.  No perforation.  GI Pathogen Panal 08/05/23 showed positive C. Difficile A/B.  She was prescribed Vancomycin 125mg  QID, however she never started the medication.    At her ED visit on 09/13/23, they advised her to go ahead and start Vancomycin 125mg  QID to treat C. Diff infectious colitis.  Patient has been only taking it twice daily for 5 days.  I advised her to increase the medicine to 4 times daily.  I explained information about C. Difficile infection which can cause colitis and very important to take the medicine as prescribed.  She states she is feeling a little better in the past 5 days.  Not eating much.  2 episodes of watery diarrhea since 11/21. She had f/u OV with her PCP and repeat labs this week.  I encouraged her to eat soft foods such as bananas, baked potato, mashed potato, rice, applesauce, protein shakes.  She expressed understanding.  I gave the patient her pathology results from her colonoscopy.  1 small adenomatous polyp was removed.  Biopsies negative for inflammatory bowel disease.  She needs follow-up office visit with me in the next 2 weeks to follow-up with C. difficile.  I instructed her to call me if her diarrhea worsens or persists.  She expressed understanding.  Velna Hatchet, Please Schedule Pt. F/u OV with me in the next 1-2 weeks.  OK to use working spot. Celso Amy, PA-C

## 2023-10-02 ENCOUNTER — Other Ambulatory Visit: Payer: Self-pay

## 2023-10-03 ENCOUNTER — Encounter: Payer: Self-pay | Admitting: Physician Assistant

## 2023-10-03 ENCOUNTER — Ambulatory Visit (INDEPENDENT_AMBULATORY_CARE_PROVIDER_SITE_OTHER): Payer: Medicare HMO | Admitting: Physician Assistant

## 2023-10-03 VITALS — BP 138/77 | HR 81 | Temp 97.6°F | Ht 59.0 in | Wt 139.8 lb

## 2023-10-03 DIAGNOSIS — Z860101 Personal history of adenomatous and serrated colon polyps: Secondary | ICD-10-CM

## 2023-10-03 DIAGNOSIS — A498 Other bacterial infections of unspecified site: Secondary | ICD-10-CM

## 2023-10-03 DIAGNOSIS — A0472 Enterocolitis due to Clostridium difficile, not specified as recurrent: Secondary | ICD-10-CM | POA: Diagnosis not present

## 2023-10-03 DIAGNOSIS — D72829 Elevated white blood cell count, unspecified: Secondary | ICD-10-CM

## 2023-10-03 DIAGNOSIS — R197 Diarrhea, unspecified: Secondary | ICD-10-CM

## 2023-10-03 DIAGNOSIS — E876 Hypokalemia: Secondary | ICD-10-CM

## 2023-10-03 NOTE — Progress Notes (Signed)
Celso Amy, PA-C [address]  [address]  [place], [place] [address]  Main: [phone]  Fax: [phone]   Primary Care Physician: [facility]  Primary Gastroenterologist:  Celso Amy, PA-C / Dr. Wyline Mood    CC: F/U C. Difficile  HPI: Kendra Herring is a 63 y.o. 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  GI Pathogen Panal 08/05/23 showed positive C. Difficile A/B.  She was prescribed Vancomycin 125mg  QID, however she never started the medication.    She had colonoscopy 09/13/23 by Dr. Tobi Bastos and was diagnosed with Pan-Colitis.  Biopsies did NOT show Inflammatory Bowel Disease.  She had worsening Abd Pain, Diarrhea, and Rectal bleeding after her colonoscopy and went to [facility] ED 09/13/23.  Potassium was low 2.8 (replaced) and she was started on potassium tablet.  Glucose 179, WBC 10, Hgb 16g.  CT scan showed Decscending Colon Coilitis.  No perforation.  At her ED visit on 09/13/23, they advised her to go ahead and start Vancomycin 125mg  QID to treat C. Diff infectious colitis.  After she started vancomycin, her GI symptoms improved.  She finished vancomycin 2 days ago.  Her PCP also discontinued her Ozempic.  Since then her dizziness has improved.  She is currently having 3 or 4 loose bowel movements daily which are not as frequent and not watery.  She has mild abdominal cramping which has improved.  She has taken 3 Imodium tablets in the past week to help with diarrhea.  Currently on vitamin D and potassium pill daily.  No more rectal bleeding.  09/13/23 Colonoscopy showed 1 small adenomatous polyp was removed.  Biopsies negative for inflammatory bowel disease. 5 year repeat.  Current Outpatient Medications  Medication Sig Dispense Refill   empagliflozin (JARDIANCE) 25 MG TABS tablet Take 25 mg by mouth daily.     oxyCODONE (ROXICODONE) 5 MG immediate release tablet Take 1 tablet (5 mg total) by mouth every 8 (eight) hours as needed for  up to 12 doses. 12 tablet 0   pantoprazole (PROTONIX) 20 MG tablet Take 20 mg by mouth daily.     No current facility-administered medications for this visit.    Allergies as of 10/03/2023 - Review Complete 10/03/2023  Allergen Reaction Noted   Pseudoephedrine Palpitations 07/04/2017    Past Medical History:  Diagnosis Date   Anxiety    Arrhythmia    Arthritis    Asthma    Cervical cancer (HCC) 03/2017   Rad, chemo and brachytherapy tx's.    Diabetes mellitus without complication (HCC)    Hernia of abdominal wall 04/17/2017   Personal history of chemotherapy    Personal history of radiation therapy    cervical cancer    Past Surgical History:  Procedure Laterality Date   BIOPSY  09/13/2023   Procedure: BIOPSY;  Surgeon: Wyline Mood, MD;  Location[facility] ENDOSCOPY;  Service: Gastroenterology;;    [place]  2023   COLONOSCOPY WITH PROPOFOL N/A 06/11/2023   Procedure: COLONOSCOPY WITH PROPOFOL;  Surgeon: Wyline Mood, MD;  Location: [facility] ENDOSCOPY;  Service: Gastroenterology;  Laterality: N/A;   COLONOSCOPY WITH PROPOFOL N/A 09/13/2023   Procedure: COLONOSCOPY WITH PROPOFOL;  Surgeon: Wyline Mood, MD;  Location[facility] ENDOSCOPY;  Service: Gastroenterology;  Laterality: N/A;   HIP SURGERY     JOINT REPLACEMENT     POLYPECTOMY  09/13/2023   Procedure: POLYPECTOMY;  Surgeon: Wyline Mood, MD;  Location: [facility] ENDOSCOPY;  Service: Gastroenterology;;   TUBAL LIGATION  Review of Systems:    All systems reviewed and negative except where noted in HPI.   Physical Examination:   BP 138/77   Pulse 81   Temp 97.6 F (36.4 C)   Ht 4\' 11"  (1.499 m)   Wt 139 lb 12.8 oz (63.4 kg)   BMI 28.24 kg/m   General: Well-nourished, well-developed in no acute distress.  Lungs: Clear to auscultation bilaterally. Non-labored. Heart: Regular rate and rhythm, no murmurs rubs or gallops.  Abdomen: Bowel sounds are normal; Abdomen is Soft; No hepatosplenomegaly, masses or hernias;  No  Abdominal Tenderness; No guarding or rebound tenderness. Neuro: Alert and oriented x 3.  Grossly intact.  Psych: Alert and cooperative, normal mood and affect.   Imaging Studies: CT ABDOMEN PELVIS WO CONTRAST  Result Date: 09/13/2023 CLINICAL DATA:  Colonoscopy this morning, abdominal pain, blood in stool EXAM: CT ABDOMEN AND PELVIS WITHOUT CONTRAST TECHNIQUE: Multidetector CT imaging of the abdomen and pelvis was performed following the standard protocol without IV contrast. Unenhanced CT was performed per clinician order. Lack of IV contrast limits sensitivity and specificity, especially for evaluation of abdominal/pelvic solid viscera. RADIATION DOSE REDUCTION: This exam was performed according to the departmental dose-optimization program which includes automated exposure control, adjustment of the mA and/or kV according to patient size and/or use of iterative reconstruction technique. COMPARISON:  12/27/2021 FINDINGS: Lower chest: No acute pleural or parenchymal lung disease. Hepatobiliary: Cholecystectomy. Unremarkable unenhanced appearance of the liver. Pancreas: Unremarkable unenhanced appearance. Spleen: Unremarkable unenhanced appearance. Adrenals/Urinary Tract: No urinary tract calculi or obstructive uropathy. The adrenals and bladder are unremarkable. Stomach/Bowel: No bowel obstruction or ileus. There is long segment mural thickening of the descending colon with pericolonic fat stranding, consistent with colitis. Normal appendix right lower quadrant. No evidence of bowel perforation. Stable duodenal diverticulum without evidence of diverticulitis. Vascular/Lymphatic: No significant vascular findings on this unenhanced exam. No pathologic adenopathy. Reproductive: Uterus and bilateral adnexa are unremarkable. Other: No free fluid or free intraperitoneal gas. No abdominal wall hernia. Musculoskeletal: No acute or destructive bony abnormalities. Postsurgical changes from right hip arthroplasty.  Reconstructed images demonstrate no additional findings. IMPRESSION: 1. Segmental wall thickening and pericolonic fat stranding involving the descending colon, most consistent with inflammatory or infectious colitis. 2. No bowel obstruction or ileus. Electronically Signed   By: Sharlet Salina M.D.   On: 09/13/2023 19:13   DG Chest Portable 1 View  Result Date: 09/13/2023 CLINICAL DATA:  Colonoscopy paired pain EXAM: PORTABLE CHEST 1 VIEW COMPARISON:  Chest x-ray 03/21/2022. FINDINGS: Underinflation. No consolidation, pneumothorax or effusion. Normal cardiopericardial silhouette with bronchovascular crowding. Overlapping cardiac leads. Film is under penetrated. No obvious free air seen beneath the diaphragm on the upright view. If there is further concern of bowel abnormality a contrast CT scan of the abdomen pelvis may be of more sensitivity IMPRESSION: Underinflation. No acute cardiopulmonary disease. Under penetrated radiograph Electronically Signed   By: Karen Kays M.D.   On: 09/13/2023 19:12    Assessment and Plan:   Kendra Herring is a 62 y.o. y/o female returns for follow-up of:  1.  C. difficile colitis -first ever episode.  Diarrhea has improved but not resolved.  Patient finished vancomycin (125 mg 4 times daily x 14 days) 2 days ago.  I am repeating C. difficile toxin PCR stool test.  If C. difficile is positive again, then we will do 6-week vancomycin taper.  Written and verbal patient education was given from up-to-date.   Multiple questions were answered.  2.  History of adenomatous colon polyp  5-year repeat colonoscopy will be due 08/2028.  Celso Amy, PA-C  Follow up in 4 weeks.

## 2023-10-04 ENCOUNTER — Encounter: Payer: Self-pay | Admitting: Nurse Practitioner

## 2023-10-04 ENCOUNTER — Inpatient Hospital Stay: Payer: Medicare HMO | Attending: Nurse Practitioner | Admitting: Nurse Practitioner

## 2023-10-04 VITALS — BP 118/94 | HR 92 | Temp 96.7°F | Resp 18 | Wt 140.0 lb

## 2023-10-04 DIAGNOSIS — Z9221 Personal history of antineoplastic chemotherapy: Secondary | ICD-10-CM | POA: Insufficient documentation

## 2023-10-04 DIAGNOSIS — Z923 Personal history of irradiation: Secondary | ICD-10-CM | POA: Insufficient documentation

## 2023-10-04 DIAGNOSIS — Z96641 Presence of right artificial hip joint: Secondary | ICD-10-CM | POA: Diagnosis not present

## 2023-10-04 DIAGNOSIS — M47816 Spondylosis without myelopathy or radiculopathy, lumbar region: Secondary | ICD-10-CM | POA: Insufficient documentation

## 2023-10-04 DIAGNOSIS — Z8541 Personal history of malignant neoplasm of cervix uteri: Secondary | ICD-10-CM | POA: Diagnosis not present

## 2023-10-04 DIAGNOSIS — Z08 Encounter for follow-up examination after completed treatment for malignant neoplasm: Secondary | ICD-10-CM | POA: Diagnosis not present

## 2023-10-04 DIAGNOSIS — R5383 Other fatigue: Secondary | ICD-10-CM | POA: Insufficient documentation

## 2023-10-04 DIAGNOSIS — Z79899 Other long term (current) drug therapy: Secondary | ICD-10-CM | POA: Insufficient documentation

## 2023-10-04 DIAGNOSIS — R232 Flushing: Secondary | ICD-10-CM | POA: Insufficient documentation

## 2023-10-04 LAB — BASIC METABOLIC PANEL
BUN/Creatinine Ratio: 21 (ref 12–28)
BUN: 13 mg/dL (ref 8–27)
CO2: 21 mmol/L (ref 20–29)
Calcium: 9.8 mg/dL (ref 8.7–10.3)
Chloride: 104 mmol/L (ref 96–106)
Creatinine, Ser: 0.61 mg/dL (ref 0.57–1.00)
Glucose: 138 mg/dL — ABNORMAL HIGH (ref 70–99)
Potassium: 3.8 mmol/L (ref 3.5–5.2)
Sodium: 142 mmol/L (ref 134–144)
eGFR: 101 mL/min/{1.73_m2} (ref >59)

## 2023-10-04 LAB — CBC
Hematocrit: 46.3 % (ref 34.0–46.6)
Hemoglobin: 15.8 g/dL (ref 11.1–15.9)
MCH: 31.3 pg (ref 26.6–33.0)
MCHC: 34.1 g/dL (ref 31.5–35.7)
MCV: 92 fL (ref 79–97)
Platelets: 236 10*3/uL (ref 150–450)
RBC: 5.05 x10E6/uL (ref 3.77–5.28)
RDW: 12 % (ref 11.7–15.4)
WBC: 6.5 10*3/uL (ref 3.4–10.8)

## 2023-10-04 NOTE — Progress Notes (Signed)
Gynecologic Oncology Interval Visit   Referring Provider: Nadara Mustard, MD   Chief Concern: cervical cancer  Subjective:  Kendra Herring is a 62 y.o. female, initially seen in consultation from Dr. Tiburcio Pea for invasive cervical cancer, s/p chemo-radiation followed by brachytherapy completed 07/2017, returns to clinic for surveillance.   She denies spotting. Hot flashes have stabilized and aren't worse. She continues to have mood swings. Denies pelvic pain. She is recently diagnosed with c diff and has been undergoing antibiotic treatments. No diarrhea currently. She has ongoing fatigue which is unchanged.   Mammogram in April 2024 was negative.   Pap history:  03/08/22- NILM, HPV negative 01/05/21- ASCUS, HPV Negative. Colpo & biopsy on 02/09/21 which was benign.  06/04/19- NILM, HPV Negative 02/06/17- Abnormal- ASGUS favoring high grade dysplasia- biopsy consistent with SCC of cervix   Oncology history Kendra Herring is a pleasant postmenopausal [phone] who presented to Dr. Tiburcio Pea evaluation and management of abnormal cervical cytology, AGUS, favor high risk cells.  Pt also had abnormal exam appearance of cervix on exam.    Colposcopy performed with 4% acetic acid as well as EMBx. Exam notable for the following: Aceto-white Lesions Location(s): throughout, abnormal appearance, contact bleeding  Pathology- 03/28/2017:         Part A: CERVIX, 6:00, BIOPSY: INVASIVE SQUAMOUS CELL CARCINOMA.  Part B: CERVIX, 10:00, BIOPSY: HIGH-GRADE SQUAMOUS INTRAEPITHELIAL LESION (CIN 3). CHRONIC CERVICITIS WITH SQUAMOUS METAPLASIA.  Part C: CERVIX, 12:00, BIOPSY: FOCAL HIGH-GRADE SQUAMOUS INTRAEPITHELIAL LESION (CIN 3). CHRONIC CERVICITIS.  Part D: ENDOCERVIX, CURETTINGS: INVASIVE SQUAMOUS CELL CARCINOMA. FRAGMENTED BIOPSY SPECIMEN.   ENDOMETRIUM, BIOPSY:  SCANT FRAGMENTS OF INACTIVE ENDOMETRIUM, MIXED WITH MINUTE FRAGMENTS OF BENIGN ENDOCERVICAL GLANDS,  MUCUS, AND BLOOD. NO HYPERPLASIA OR  CARCINOMA.    She was seen in the ER 03/28/2017 for back pain. Workup included:  CT scan - dedicated renal protocol: 1. Negative for nephrolithiasis, hydronephrosis or ureteral stone. 2. There are no acute abnormalities visualized.  Dr Sonia Side examined patient and felt it was difficult to feel the tumor and get a sense of its size because it is inside the canal.  And she was concerned about her back pain possibly being due to metastatic disease.   PET/CT 04/16/17 IMPRESSION:  I reviewed images with the radiologist on the phone. 1. Small focus of abnormal accentuated activity along the posterior cervix, potentially a site of focal neoplastic disease/malignancy. No pathologic adenopathy or evidence of metastatic spread. 2. Right total hip prosthesis noted with cerclage wires along the stem component. 3. Lumbar spondylosis and degenerative disc disease with potential impingement at L3-4 and L4-5.   MRI IMPRESSION: Image degradation by artifact from right hip prosthesis noted.  Subtle ill-defined 1.9 cm mass in the posterior wall of the cervix, consistent with known cervical carcinoma.  No evidence of extra-cervical tumor extension or pelvic lymphadenopathy.  She was seen at [facility] for consultation and stage was felt to be IIA due to extension of the tumor into posterior vagina.  Decision was made to treat her with chemoradiation and see received weekly cisplatin (05/09/2017-06/06/2017; 5 cycles) and external radiation (05/08/2017-06/21/2017) followed by brachytherapy (07/04/2017-07/24/2017) with complete response.  CT scan 8/20.   IMPRESSION: 1. No evidence of metastatic cervical carcinoma. 2. Stable small enhancing lesion RIGHT hepatic lobe is favored benign. 3. Small external iliac lymph nodes are stable and not pathologic by size criteria.  She had a surveillance visit on 01/05/2021 and Pap ASCUS and HRHPV negative. Dr. Johnnette Litter recommended colposcopy b/c she had  bleeding. This was thought to be secondary to  atrophic vaginal tissue and post brachytherapy. If symptoms persist he recommended CT to evaluate further.   She saw Dr. Sonia Side 02/09/21 with colposcopy. Biopsy obtained:  DIAGNOSIS:  A.  VAGINA; BIOPSY:  - SCLEROTIC FIBROUS TISSUE, INTACT EPITHELIUM NOT IDENTIFIED.  - DEEPER SECTIONS EXAMINED.  - NEGATIVE FOR MALIGNANCY.   Problem List: Patient Active Problem List   Diagnosis Date Noted   Diarrhea 09/13/2023   Adenomatous polyp of colon 09/13/2023   Encounter for follow-up surveillance of cervical cancer 03/08/2022   Abnormal vaginal bleeding 03/08/2022   RUQ pain 12/30/2021   ASCUS of cervix with negative high risk HPV 02/09/2021   Exertional shortness of breath 12/25/2019   Family history of coronary artery disease 12/25/2019   Strain of rotator cuff capsule 11/12/2017   Anxiety disorder 04/30/2017   Squamous cell carcinoma of cervix (HCC) 04/30/2017   Cervical carcinoma (HCC) 04/06/2017   Hip pain 05/02/2016   History of total hip arthroplasty 05/02/2016    Past Medical History: Past Medical History:  Diagnosis Date   Anxiety    Arrhythmia    Arthritis    Asthma    Cervical cancer (HCC) 03/2017   Rad, chemo and brachytherapy tx's.    Diabetes mellitus without complication (HCC)    Hernia of abdominal wall 04/17/2017   Personal history of chemotherapy    Personal history of radiation therapy    cervical cancer    Past Surgical History: Past Surgical History:  Procedure Laterality Date   BIOPSY  09/13/2023   Procedure: BIOPSY;  Surgeon: Wyline Mood, MD;  Location: [facility] ENDOSCOPY;  Service: Gastroenterology;;   Logan Callas  2023   COLONOSCOPY WITH PROPOFOL N/A 06/11/2023   Procedure: COLONOSCOPY WITH PROPOFOL;  Surgeon: Wyline Mood, MD;  Location[facility] ENDOSCOPY;  Service: Gastroenterology;  Laterality: N/A;   COLONOSCOPY WITH PROPOFOL N/A 09/13/2023   Procedure: COLONOSCOPY WITH PROPOFOL;  Surgeon: Wyline Mood, MD;  Location: [facility] ENDOSCOPY;  Service:  Gastroenterology;  Laterality: N/A;   HIP SURGERY     JOINT REPLACEMENT     POLYPECTOMY  09/13/2023   Procedure: POLYPECTOMY;  Surgeon: Wyline Mood, MD;  Location[facility]COPY;  Service: Gastroenterology;;   TUBAL LIGATION      Past Gynecologic History:  Menarche: 15 Menstrual details: postmenopausal Menses regular: no Last Menstrual Period: a year ago History of Abnormal pap: yes as per HPI Last pap: see HPI Contraception: bilateral tubal ligation   OB History:  OB History  Gravida Para Term Preterm AB Living  3 3 3   3   SAB IAB Ectopic Multiple Live Births          # Outcome Date GA Lbr Len/2nd Weight Sex Type Anes PTL Lv  3 Term 08/05/89          2 Term 09/09/79          1 Term 10/16/69            Family History: Family History  Problem Relation Age of Onset   Diabetes Mother    Breast cancer Neg Hx     Social History: Social History   Socioeconomic History   Marital status: Divorced    Spouse name: Not on file   Number of children: Not on file   Years of education: Not on file   Highest education level: Not on file  Occupational History   Not on file  Tobacco Use   Smoking status: Never   Smokeless tobacco:  Never  Vaping Use   Vaping status: Never Used  Substance and Sexual Activity   Alcohol use: No   Drug use: No   Sexual activity: Not Currently    Birth control/protection: None  Other Topics Concern   Not on file  Social History Narrative   Not on file   Social Drivers of Health   Financial Resource Strain: Not on file  Food Insecurity: Not on file  Transportation Needs: Not on file  Physical Activity: Not on file  Stress: Not on file  Social Connections: Not on file  Intimate Partner Violence: Not on file    Allergies: Allergies  Allergen Reactions   Pseudoephedrine Palpitations    Current Medications: Current Outpatient Medications  Medication Sig Dispense Refill   empagliflozin (JARDIANCE) 25 MG TABS tablet Take 25 mg by  mouth daily.     oxyCODONE (ROXICODONE) 5 MG immediate release tablet Take 1 tablet (5 mg total) by mouth every 8 (eight) hours as needed for up to 12 doses. 12 tablet 0   pantoprazole (PROTONIX) 20 MG tablet Take 20 mg by mouth daily.     No current facility-administered medications for this visit.   Review of Systems General:  fatigue  Skin: hot flashes Eyes: no complaints HEENT: no complaints Breasts: no complaints Pulmonary: cough o/w no complaints Cardiac: no complaints Gastrointestinal: diarrhea, abdominal pain Genitourinary/Sexual: no complaints Ob/Gyn: bleeding resolved Musculoskeletal: no complaints Hematology: no complaints Neurologic/Psych: mood swings  Objective:  Physical Examination:  BP (!) 118/94   Pulse 92   Temp (!) 96.7 F (35.9 C) (Tympanic)   Resp 18   Wt 140 lb (63.5 kg)   SpO2 96%   BMI 28.28 kg/m   GENERAL: Patient is a well appearing female in no acute distress NODES:  Negative axillary, supraclavicular, inguinal lymph node survery LUNGS:  Clear to auscultation bilaterally.   HEART:  Regular rate and rhythm.  ABDOMEN:  Soft, nontender. No masses or ascites or hernias. Well healed incisions EXTREMITIES:  No peripheral edema. Uses a walker. Limited hip mobility SKIN:  well healed laparoscopic incisions.  NEURO:  Nonfocal. Well oriented.  Appropriate affect.  Pelvic: Exam chaperoned by CMA EGBUS: no lesions or discharge. Vagina: atrophic and agglutinated. No gross lesions. Unable to full visualize cervix. Vaginal pap obtained. Tissue pale. BME: smooth, no palpable masses. Nontender. No nodularity. Rectal: deferred.   Labs:  Per hpi  Imaging:  No imaging on site    Assessment:  Kendra Herring is a 62 y.o. 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 Treated with chemoradiation and received weekly cisplatin (05/09/2017-06/06/2017; 5 cycles) and external radiation  (05/08/2017-06/21/2017) followed by brachytherapy (07/04/2017-07/24/2017) with complete response.  01/05/2021 ASCUS Pap/HRHPV 02/09/21 abnormal AWE vagina on colposcopy; Biopsy 7 o'clock negative. Repeat pap 02/2022- NILM, HPV Negative.  Clinically, NED today however unable to visualize cervix due to agglutination.   CT abdomen pelvis d/t abdominal pain 12/27/21 was negative for recurrence or metastatic disease.   Vaginal spotting and bleeding intermittently of unknown origin. Most likely cause is atrophy and radiation changes. Now resolved. Declined topical estrogen.   Cough- resolved. Suspect post viral  Hot flashes- secondary to menopause d/t treatments for malignancy. Declined medications.   Medical co-morbidities complicating care:  history of cardiac arrythmia, s/p hip surgery with mobility issues, and arthritis . C diff  Plan:   Problem List Items Addressed This Visit   None  We reviewed  NCCN surveillance guidelines. She is now 6 years from completion of treatment which is encouraging. She can transition to annual pelvic exams. We discussed symptoms that would be concerning for recurrence and warrant earlier return.   Declined venlafaxine or topical estrogen. Unable to visualize cervix today d/t agglutination. Recommended vaginal dilators. Not sexually active.   Previously abnormal pap with negative biopsy. Repeat pap in May 2023 negative. Repeat pap today. Consider topical estrogen with dilators.   She can follow up with her pcp for her chronic medical problems and other cancer screenings and wellness care.   She can return to gyn onc clinic in 1 year for continued surveillance.   The patient's diagnosis, an outline of the further diagnostic and laboratory studies which will be required, the recommendation, and alternatives were discussed.  All questions were answered to the patient's satisfaction.   Alinda Dooms, NP

## 2023-10-05 LAB — CLOSTRIDIUM DIFFICILE BY PCR: Toxigenic C. Difficile by PCR: NEGATIVE

## 2023-10-05 NOTE — Progress Notes (Signed)
Call and notify patient stool test is negative for C. difficile infection.  This is great news!  It appears that the C. difficile infection is gone since she took vancomycin.  Continue with current plan.  Please let me know if diarrhea returns or becomes severe again. Celso Amy, PA-C

## 2023-10-05 NOTE — Progress Notes (Signed)
Call and notify patient her labs have greatly improved from previous labs 3 weeks ago.  Kidney function, potassium, and electrolytes are normal.  Hemoglobin and white count are also normal.  Continue with current plan to complete the C. difficile stool test. Celso Amy, PA-C

## 2023-10-08 ENCOUNTER — Telehealth: Payer: Self-pay

## 2023-10-08 NOTE — Telephone Encounter (Signed)
Patient notified.   Call and notify patient stool test is negative for C. difficile infection.  This is great news!  It appears that the C. difficile infection is gone since she took vancomycin.  Continue with current plan.  Please let me know if diarrhea returns or becomes severe again.  Celso Amy, PA-C   Call and notify patient her labs have greatly improved from previous labs 3 weeks ago.  Kidney function, potassium, and electrolytes are normal.  Hemoglobin and white count are also normal.  Continue with current plan to complete the C. difficile stool test.  Celso Amy, PA-C

## 2023-10-12 LAB — IGP, APTIMA HPV

## 2023-11-07 ENCOUNTER — Telehealth: Payer: Self-pay | Admitting: Physician Assistant

## 2023-11-07 NOTE — Telephone Encounter (Signed)
 The patient called in and left a voicemail requesting to know when her appointment. I called her back to let her know that we got her message, and I reschedule the patient.

## 2023-11-08 ENCOUNTER — Ambulatory Visit: Payer: Medicaid Other | Admitting: Physician Assistant

## 2023-12-12 NOTE — Progress Notes (Deleted)
 Celso Amy, PA-C [address]  [address]  [place], [place] [address]  Main: [phone]  Fax: [phone]   Primary Care Physician: [facility]  Primary Gastroenterologist:  ***  CC: F/U C. Difficile  HPI: Kendra Herring is a 63 y.o. female returns for 2 month follow-up of C. Difficile.    GI Pathogen Panal 08/05/23 showed positive C. Difficile A/B.  She was prescribed Vancomycin 125mg  QID, however she never started the medication.     09/13/23 She took Vancomycin 125mg  QID x 10 days for C. Diff (First Episode & First Treatment).  Diarrhea improved.  09/13/23 Colonoscopy showed 1 small adenomatous polyp was removed.  Biopsies negative for inflammatory bowel disease. 5 year repeat.   10/04/23: C. Diff Toxin PCR Negative.  CBC and BMP normal.  She is currently doing Herring.  She has not had any recurrent diarrhea in the past 2 months.  Current Outpatient Medications  Medication Sig Dispense Refill   empagliflozin (JARDIANCE) 25 MG TABS tablet Take 25 mg by mouth daily.     loperamide (IMODIUM) 2 MG capsule Take by mouth.     oxyCODONE (ROXICODONE) 5 MG immediate release tablet Take 1 tablet (5 mg total) by mouth every 8 (eight) hours as needed for up to 12 doses. 12 tablet 0   pantoprazole (PROTONIX) 20 MG tablet Take 20 mg by mouth daily.     Vitamin D, Ergocalciferol, (DRISDOL) 1.25 MG (50000 UNIT) CAPS capsule Take 50,000 Units by mouth once a week.     No current facility-administered medications for this visit.    Allergies as of 12/13/2023 - Review Complete 10/04/2023  Allergen Reaction Noted   Pseudoephedrine Palpitations 07/04/2017    Past Medical History:  Diagnosis Date   Anxiety    Arrhythmia    Arthritis    Asthma    Cervical cancer (HCC) 03/2017   Rad, chemo and brachytherapy tx's.    Diabetes mellitus without complication (HCC)    Hernia of abdominal wall 04/17/2017   Personal history of chemotherapy    Personal history  of radiation therapy    cervical cancer    Past Surgical History:  Procedure Laterality Date   BIOPSY  09/13/2023   Procedure: BIOPSY;  Surgeon: Wyline Mood, MD;  Location: [facility] ENDOSCOPY;  Service: Gastroenterology;;   Lafitte Callas  2023   COLONOSCOPY WITH PROPOFOL N/A 06/11/2023   Procedure: COLONOSCOPY WITH PROPOFOL;  Surgeon: Wyline Mood, MD;  Location[facility] ENDOSCOPY;  Service: Gastroenterology;  Laterality: N/A;   COLONOSCOPY WITH PROPOFOL N/A 09/13/2023   Procedure: COLONOSCOPY WITH PROPOFOL;  Surgeon: Wyline Mood, MD;  Location: [facility] ENDOSCOPY;  Service: Gastroenterology;  Laterality: N/A;   HIP SURGERY     JOINT REPLACEMENT     POLYPECTOMY  09/13/2023   Procedure: POLYPECTOMY;  Surgeon: Wyline Mood, MD;  Location: [facility] ENDOSCOPY;  Service: Gastroenterology;;   TUBAL LIGATION      Review of Systems:    All systems reviewed and negative except where noted in HPI.   Physical Examination:   There were no vitals taken for this visit.  General: Herring-nourished, Herring-developed in no acute distress.  Lungs: Clear to auscultation bilaterally. Non-labored. Heart: Regular rate and rhythm, no murmurs rubs or gallops.  Abdomen: Bowel sounds are normal; Abdomen is Soft; No hepatosplenomegaly, masses or hernias;  No Abdominal Tenderness; No guarding or rebound tenderness. Neuro: Alert and oriented x 3.  Grossly intact.  Psych: Alert and cooperative, normal mood and affect.  Imaging Studies: No results found.  Assessment and Plan:   Kendra Herring is a 63 y.o. y/o female ***    Celso Amy, PA-C  Follow up ***  BP check ***

## 2023-12-13 ENCOUNTER — Ambulatory Visit: Payer: Medicaid Other | Admitting: Physician Assistant

## 2024-02-13 ENCOUNTER — Other Ambulatory Visit: Payer: Self-pay | Admitting: Family Medicine

## 2024-02-13 DIAGNOSIS — Z1231 Encounter for screening mammogram for malignant neoplasm of breast: Secondary | ICD-10-CM

## 2024-03-03 ENCOUNTER — Encounter (HOSPITAL_COMMUNITY): Payer: Self-pay

## 2024-03-10 ENCOUNTER — Ambulatory Visit
Admission: RE | Admit: 2024-03-10 | Discharge: 2024-03-10 | Disposition: A | Discharge status: Home / Self Care | Source: Ambulatory Visit | Attending: Family Medicine | Admitting: Family Medicine

## 2024-03-10 DIAGNOSIS — Z1231 Encounter for screening mammogram for malignant neoplasm of breast: Secondary | ICD-10-CM | POA: Insufficient documentation

## 2024-09-18 ENCOUNTER — Encounter: Payer: Self-pay | Admitting: Emergency Medicine

## 2024-09-18 ENCOUNTER — Emergency Department

## 2024-09-18 ENCOUNTER — Other Ambulatory Visit: Payer: Self-pay

## 2024-09-18 ENCOUNTER — Emergency Department
Admission: EM | Admit: 2024-09-18 | Discharge: 2024-09-18 | Disposition: A | Discharge status: Home / Self Care | Attending: Emergency Medicine | Admitting: Emergency Medicine

## 2024-09-18 DIAGNOSIS — R059 Cough, unspecified: Secondary | ICD-10-CM | POA: Insufficient documentation

## 2024-09-18 DIAGNOSIS — R0602 Shortness of breath: Secondary | ICD-10-CM | POA: Diagnosis present

## 2024-09-18 DIAGNOSIS — J45901 Unspecified asthma with (acute) exacerbation: Secondary | ICD-10-CM | POA: Insufficient documentation

## 2024-09-18 LAB — RESP PANEL BY RT-PCR (RSV, FLU A&B, COVID)  RVPGX2
Influenza A by PCR: NEGATIVE
Influenza B by PCR: NEGATIVE
Resp Syncytial Virus by PCR: NEGATIVE
SARS Coronavirus 2 by RT PCR: NEGATIVE

## 2024-09-18 MED ORDER — ALBUTEROL SULFATE HFA 108 (90 BASE) MCG/ACT IN AERS
2.0000 | INHALATION_SPRAY | Freq: Four times a day (QID) | RESPIRATORY_TRACT | 0 refills | Status: AC | PRN
Start: 2024-09-18 — End: ?

## 2024-09-18 MED ORDER — PREDNISONE 20 MG PO TABS
60.0000 mg | ORAL_TABLET | Freq: Once | ORAL | Status: AC
  Administered 2024-09-18: 60 mg via ORAL
  Filled 2024-09-18: qty 3

## 2024-09-18 MED ORDER — IPRATROPIUM-ALBUTEROL 0.5-2.5 (3) MG/3ML IN SOLN
6.0000 mL | Freq: Once | RESPIRATORY_TRACT | Status: AC
  Administered 2024-09-18: 6 mL via RESPIRATORY_TRACT
  Filled 2024-09-18: qty 6

## 2024-09-18 MED ORDER — PREDNISONE 10 MG PO TABS: 40.0000 mg | ORAL_TABLET | Freq: Every day | ORAL | 0 refills | Status: AC

## 2024-09-18 MED ORDER — BENZONATATE 100 MG PO CAPS: 100.0000 mg | ORAL_CAPSULE | Freq: Three times a day (TID) | ORAL | 0 refills | Status: AC | PRN

## 2024-09-18 NOTE — ED Provider Notes (Signed)
 [facility] Provider Note    Event Date/Time   First MD Initiated Contact with Patient 09/18/24 2002     (approximate)   History   Cough   HPI  Kendra Herring is a 63 year old female with history of asthma, anxiety presenting to the emergency department for evaluation of cough and shortness of breath.  Patient reports that she was spraying for roaches in her house.  She noticed a cough this morning and feeling of shortness of breath.  Ran out of her albuterol  inhaler.  No fevers.  No known sick contacts.     Physical Exam   Triage Vital Signs: ED Triage Vitals  Encounter Vitals Group     BP 09/18/24 1954 (!) 146/71     Girls Systolic BP Percentile --      Girls Diastolic BP Percentile --      Boys Systolic BP Percentile --      Boys Diastolic BP Percentile --      Pulse Rate 09/18/24 1954 100     Resp 09/18/24 1954 18     Temp 09/18/24 1954 98.2 F (36.8 C)     Temp Source 09/18/24 1954 Oral     SpO2 09/18/24 1954 95 %     Weight 09/18/24 1954 135 lb (61.2 kg)     Height 09/18/24 1954 4' 11 (1.499 m)     Head Circumference --      Peak Flow --      Pain Score 09/18/24 1956 0     Pain Loc --      Pain Education --      Exclude from Growth Chart --     Most recent vital signs: Vitals:   09/18/24 1954  BP: (!) 146/71  Pulse: 100  Resp: 18  Temp: 98.2 F (36.8 C)  SpO2: 95%     General: Awake, interactive  CV:  Good peripheral perfusion Resp:  Decreased air movement with scattered wheezes, respirations mildly labored Abd:  Nondistended.  Neuro:  Symmetric facial movement, fluid speech   ED Results / Procedures / Treatments   Labs (all labs ordered are listed, but only abnormal results are displayed) Labs Reviewed  RESP PANEL BY RT-PCR (RSV, FLU A&B, COVID)  [id number]     EKG EKG independently reviewed and interpreted by myself demonstrates:    RADIOLOGY Imaging independently reviewed and interpreted by myself  demonstrates:  CXR without focal consolidation  Formal Radiology Read:  DG Chest 2 View Result Date: 09/18/2024 EXAM: 2 VIEW(S) XRAY OF THE CHEST 09/18/2024 08:17:11 PM COMPARISON: Comparison with 09/13/2023. CLINICAL HISTORY: shob FINDINGS: LUNGS AND PLEURA: No focal pulmonary opacity. No pleural effusion. No pneumothorax. HEART AND MEDIASTINUM: No acute abnormality of the cardiac and mediastinal silhouettes. BONES AND SOFT TISSUES: No acute osseous abnormality. IMPRESSION: 1. No acute cardiopulmonary process. Electronically signed by: Elsie Gravely MD 09/18/2024 08:27 PM EST RP Workstation: [id number]    PROCEDURES:  Critical Care performed: No  Procedures   MEDICATIONS ORDERED IN ED: Medications  ipratropium-albuterol  (DUONEB) 0.5-2.5 (3) MG/3ML nebulizer solution 6 mL (6 mLs Nebulization Given 09/18/24 2047)  predniSONE  (DELTASONE ) tablet 60 mg (60 mg Oral Given 09/18/24 2047)     IMPRESSION / MDM / ASSESSMENT AND PLAN / ED COURSE  I reviewed the triage vital signs and the nursing notes.  Differential diagnosis includes, but is not limited to, asthma exacerbation with possible viral or environmental trigger, pneumonia  Patient's presentation is most consistent with acute complicated illness /  injury requiring diagnostic workup.  63 year old female presenting to the emergency department for evaluation of shortness of breath.  Wheezing on exam with known history of asthma.  Treated with DuoNebs and steroids.  Viral swab negative.  X-Amelya Mabry without evidence of pneumonia.  Patient reassessed and feels much improved on reevaluation.  She is comfortable with discharge home.  Will DC with prescription for albuterol  inhaler, steroids, and Tessalon  Perles.  Strict return precautions provided.  Patient discharged in stable condition.      FINAL CLINICAL IMPRESSION(S) / ED DIAGNOSES   Final diagnoses:  Exacerbation of asthma, unspecified asthma severity, unspecified whether persistent      Rx / DC Orders   ED Discharge Orders          Ordered    predniSONE  (DELTASONE ) 10 MG tablet  Daily       Note to Pharmacy: Cannot swallow 20mg  tablets   09/18/24 2205    albuterol  (VENTOLIN  HFA) 108 (90 Base) MCG/ACT inhaler  Every 6 hours PRN        09/18/24 2205    benzonatate  (TESSALON ) 100 MG capsule  3 times daily PRN        09/18/24 2205             Note:  This document was prepared using Dragon voice recognition software and may include unintentional dictation errors.   Levander Slate, MD 09/18/24 [phone]

## 2024-09-18 NOTE — ED Triage Notes (Signed)
 Pt reports cough since this morning. Also reports feeling short of breath. Hx asthma & bronchitis. Ran out of inhaler.

## 2024-09-18 NOTE — Discharge Instructions (Signed)
 You were seen in the ER today for shortness of breath.  I suspect this is likely related to a flare of your asthma.  I sent a prescription for a refill of your albuterol  inhaler, cough medicine, and a short steroid course to your pharmacy.  Follow with your primary care doctor for further evaluation.  Return to the ER for new or worsening symptoms.

## 2024-09-29 ENCOUNTER — Telehealth: Payer: Self-pay | Admitting: Nurse Practitioner

## 2024-09-29 NOTE — Telephone Encounter (Signed)
 Pt was scheduled for appt on 12/12, pt called to r/s due to being sick. Appts are r/s to the following week and date/time confirmed with pt.

## 2024-09-30 ENCOUNTER — Other Ambulatory Visit: Payer: Self-pay | Admitting: Family Medicine

## 2024-09-30 ENCOUNTER — Ambulatory Visit
Admission: RE | Admit: 2024-09-30 | Discharge: 2024-09-30 | Disposition: A | Attending: Family Medicine | Admitting: Family Medicine

## 2024-09-30 ENCOUNTER — Ambulatory Visit
Admission: RE | Admit: 2024-09-30 | Discharge: 2024-09-30 | Disposition: A | Source: Ambulatory Visit | Attending: Family Medicine | Admitting: Family Medicine

## 2024-09-30 DIAGNOSIS — B9689 Other specified bacterial agents as the cause of diseases classified elsewhere: Secondary | ICD-10-CM

## 2024-10-03 ENCOUNTER — Ambulatory Visit: Payer: Medicare HMO | Admitting: Nurse Practitioner

## 2024-10-10 ENCOUNTER — Inpatient Hospital Stay: Payer: Self-pay | Attending: Nurse Practitioner | Admitting: Nurse Practitioner

## 2024-10-10 ENCOUNTER — Encounter: Payer: Self-pay | Admitting: Nurse Practitioner

## 2024-10-10 VITALS — BP 132/75 | HR 87 | Temp 97.6°F | Resp 18 | Ht 59.0 in | Wt 134.9 lb

## 2024-10-10 DIAGNOSIS — R232 Flushing: Secondary | ICD-10-CM | POA: Diagnosis not present

## 2024-10-10 DIAGNOSIS — Z96641 Presence of right artificial hip joint: Secondary | ICD-10-CM | POA: Diagnosis not present

## 2024-10-10 DIAGNOSIS — Z08 Encounter for follow-up examination after completed treatment for malignant neoplasm: Secondary | ICD-10-CM

## 2024-10-10 DIAGNOSIS — N938 Other specified abnormal uterine and vaginal bleeding: Secondary | ICD-10-CM | POA: Insufficient documentation

## 2024-10-10 DIAGNOSIS — Z923 Personal history of irradiation: Secondary | ICD-10-CM | POA: Diagnosis not present

## 2024-10-10 DIAGNOSIS — Z79899 Other long term (current) drug therapy: Secondary | ICD-10-CM | POA: Diagnosis not present

## 2024-10-10 DIAGNOSIS — Z8541 Personal history of malignant neoplasm of cervix uteri: Secondary | ICD-10-CM | POA: Diagnosis not present

## 2024-10-10 DIAGNOSIS — Z9221 Personal history of antineoplastic chemotherapy: Secondary | ICD-10-CM | POA: Insufficient documentation

## 2024-10-10 NOTE — Progress Notes (Signed)
 Gynecologic Oncology Interval Visit   Referring Provider: Lamar SHAUNNA Lesches, MD   Chief Concern: cervical cancer  Subjective:  Kendra Herring is a 63 y.o. female, initially seen in consultation from Dr. Lesches for invasive cervical cancer, s/p chemo-radiation followed by brachytherapy completed 07/2017, returns to clinic for surveillance.   She has rare spotting. Ongoing hot flashes. Denies pelvic pain, discharge, or bleeding. She has not done dilator exercises. No longer sexually active since her boyfriend passed away. Her brother is ill with cancer and being treated at [facility].   Mammogram in May 2025 was negative.  Colonoscopy 08/2023 with Dr Therisa  Pap history:  10/04/23- NILM, Insufficient sample for HPV 03/08/22- NILM, HPV negative 01/05/21- ASCUS, HPV Negative. Colpo & biopsy on 02/09/21 which was benign.  06/04/19- NILM, HPV Negative 02/06/17- Abnormal- ASGUS favoring high grade dysplasia- biopsy consistent with SCC of cervix   Oncology History Kendra Herring is a pleasant postmenopausal [phone] who presented to Dr. Lesches evaluation and management of abnormal cervical cytology, AGUS, favor high risk cells.  Pt also had abnormal exam appearance of cervix on exam.    Colposcopy performed with 4% acetic acid as well as EMBx. Exam notable for the following: Aceto-white Lesions Location(s): throughout, abnormal appearance, contact bleeding  Pathology- 03/28/2017:         Part A: CERVIX, 6:00, BIOPSY: INVASIVE SQUAMOUS CELL CARCINOMA.  Part B: CERVIX, 10:00, BIOPSY: HIGH-GRADE SQUAMOUS INTRAEPITHELIAL LESION (CIN 3). CHRONIC CERVICITIS WITH SQUAMOUS METAPLASIA.  Part C: CERVIX, 12:00, BIOPSY: FOCAL HIGH-GRADE SQUAMOUS INTRAEPITHELIAL LESION (CIN 3). CHRONIC CERVICITIS.  Part D: ENDOCERVIX, CURETTINGS: INVASIVE SQUAMOUS CELL CARCINOMA. FRAGMENTED BIOPSY SPECIMEN.   ENDOMETRIUM, BIOPSY:  SCANT FRAGMENTS OF INACTIVE ENDOMETRIUM, MIXED WITH MINUTE FRAGMENTS OF BENIGN ENDOCERVICAL GLANDS,  MUCUS,  AND BLOOD. NO HYPERPLASIA OR  CARCINOMA.   She was seen in the ER 03/28/2017 for back pain. Workup included:  CT scan - dedicated renal protocol: 1. Negative for nephrolithiasis, hydronephrosis or ureteral stone. 2. There are no acute abnormalities visualized.  Dr Elby examined patient and felt it was difficult to feel the tumor and get a sense of its size because it is inside the canal.  And she was concerned about her back pain possibly being due to metastatic disease.   PET/CT 04/16/17 IMPRESSION:  I reviewed images with the radiologist on the phone. 1. Small focus of abnormal accentuated activity along the posterior cervix, potentially a site of focal neoplastic disease/malignancy. No pathologic adenopathy or evidence of metastatic spread. 2. Right total hip prosthesis noted with cerclage wires along the stem component. 3. Lumbar spondylosis and degenerative disc disease with potential impingement at L3-4 and L4-5.   MRI IMPRESSION: Image degradation by artifact from right hip prosthesis noted.  Subtle ill-defined 1.9 cm mass in the posterior wall of the cervix, consistent with known cervical carcinoma.  No evidence of extra-cervical tumor extension or pelvic lymphadenopathy.  She was seen at [facility] for consultation and stage was felt to be IIA due to extension of the tumor into posterior vagina.  Decision was made to treat her with chemoradiation and see received weekly cisplatin  (05/09/2017-06/06/2017; 5 cycles) and external radiation (05/08/2017-06/21/2017) followed by brachytherapy (07/04/2017-07/24/2017) with complete response.  CT scan 8/20.   IMPRESSION: 1. No evidence of metastatic cervical carcinoma. 2. Stable small enhancing lesion RIGHT hepatic lobe is favored benign. 3. Small external iliac lymph nodes are stable and not pathologic by size criteria.  She had a surveillance visit on 01/05/2021 and Pap ASCUS and HRHPV  negative. Dr. Mancil recommended colposcopy b/c she had  bleeding. This was thought to be secondary to atrophic vaginal tissue and post brachytherapy. If symptoms persist he recommended CT to evaluate further.   She saw Dr. Elby 02/09/21 with colposcopy. Biopsy obtained:  DIAGNOSIS:  A.  VAGINA; BIOPSY:  - SCLEROTIC FIBROUS TISSUE, INTACT EPITHELIUM NOT IDENTIFIED.  - DEEPER SECTIONS EXAMINED.  - NEGATIVE FOR MALIGNANCY.   Problem List: Patient Active Problem List   Diagnosis Date Noted   Diarrhea 09/13/2023   Adenomatous polyp of colon 09/13/2023   Encounter for follow-up surveillance of cervical cancer 03/08/2022   Abnormal vaginal bleeding 03/08/2022   RUQ pain 12/30/2021   ASCUS of cervix with negative high risk HPV 02/09/2021   Exertional shortness of breath 12/25/2019   Family history of coronary artery disease 12/25/2019   Strain of rotator cuff capsule 11/12/2017   Anxiety disorder 04/30/2017   Squamous cell carcinoma of cervix (HCC) 04/30/2017   Cervical carcinoma (HCC) 04/06/2017   Hip pain 05/02/2016   History of total hip arthroplasty 05/02/2016    Past Medical History: Past Medical History:  Diagnosis Date   Anxiety    Arrhythmia    Arthritis    Asthma    Cervical cancer (HCC) 03/2017   Rad, chemo and brachytherapy tx's.    Diabetes mellitus without complication (HCC)    Hernia of abdominal wall 04/17/2017   Personal history of chemotherapy    Personal history of radiation therapy    cervical cancer    Past Surgical History: Past Surgical History:  Procedure Laterality Date   BIOPSY  09/13/2023   Procedure: BIOPSY;  Surgeon: Therisa Bi, MD;  Location: [facility] ENDOSCOPY;  Service: Gastroenterology;;   SALVADOR  2023   COLONOSCOPY WITH PROPOFOL  N/A 06/11/2023   Procedure: COLONOSCOPY WITH PROPOFOL ;  Surgeon: Therisa Bi, MD;  Location: [facility] ENDOSCOPY;  Service: Gastroenterology;  Laterality: N/A;   COLONOSCOPY WITH PROPOFOL  N/A 09/13/2023   Procedure: COLONOSCOPY WITH PROPOFOL ;  Surgeon: Therisa Bi,  MD;  Location: [facility] ENDOSCOPY;  Service: Gastroenterology;  Laterality: N/A;   HIP SURGERY     JOINT REPLACEMENT     POLYPECTOMY  09/13/2023   Procedure: POLYPECTOMY;  Surgeon: Therisa Bi, MD;  Location: [facility] ENDOSCOPY;  Service: Gastroenterology;;   TUBAL LIGATION      Past Gynecologic History:  Menarche: 15 Menstrual details: postmenopausal Menses regular: no Last Menstrual Period: a year ago History of Abnormal pap: yes as per HPI Last pap: see HPI Contraception: bilateral tubal ligation   OB History:  OB History  Gravida Para Term Preterm AB Living  3 3 3   3   SAB IAB Ectopic Multiple Live Births          # Outcome Date GA Lbr Len/2nd Weight Sex Type Anes PTL Lv  3 Term 08/05/89          2 Term 09/09/79          1 Term 10/16/69            Family History: Family History  Problem Relation Age of Onset   Diabetes Mother    Breast cancer Neg Hx     Social History: Social History   Socioeconomic History   Marital status: Divorced    Spouse name: Not on file   Number of children: Not on file   Years of education: Not on file   Highest education level: Not on file  Occupational History   Not on file  Tobacco Use  Smoking status: Never   Smokeless tobacco: Never  Vaping Use   Vaping status: Never Used  Substance and Sexual Activity   Alcohol use: No   Drug use: No   Sexual activity: Not Currently    Birth control/protection: None  Other Topics Concern   Not on file  Social History Narrative   Not on file   Social Drivers of Health   Tobacco Use: Low Risk (09/18/2024)   Patient History    Smoking Tobacco Use: Never    Smokeless Tobacco Use: Never    Passive Exposure: Not on file  Financial Resource Strain: Not on file  Food Insecurity: Not on file  Transportation Needs: Not on file  Physical Activity: Not on file  Stress: Not on file  Social Connections: Not on file  Intimate Partner Violence: Not on file  Depression (EYV7-0): Not on file   Alcohol Screen: Not on file  Housing: Not on file  Utilities: Not on file  Health Literacy: Not on file    Allergies: Allergies  Allergen Reactions   Pseudoephedrine Palpitations    Current Medications: Current Outpatient Medications  Medication Sig Dispense Refill   albuterol  (VENTOLIN  HFA) 108 (90 Base) MCG/ACT inhaler Inhale 2 puffs into the lungs every 6 (six) hours as needed for wheezing or shortness of breath. 8.5 g 0   benzonatate  (TESSALON ) 100 MG capsule Take 1 capsule (100 mg total) by mouth 3 (three) times daily as needed for cough. 20 capsule 0   empagliflozin (JARDIANCE) 25 MG TABS tablet Take 25 mg by mouth daily.     loperamide (IMODIUM) 2 MG capsule Take by mouth.     oxyCODONE  (ROXICODONE ) 5 MG immediate release tablet Take 1 tablet (5 mg total) by mouth every 8 (eight) hours as needed for up to 12 doses. 12 tablet 0   pantoprazole  (PROTONIX ) 20 MG tablet Take 20 mg by mouth daily.     Vitamin D, Ergocalciferol, (DRISDOL) 1.25 MG (50000 UNIT) CAPS capsule Take 50,000 Units by mouth once a week.     No current facility-administered medications for this visit.   Review of Systems General:  fatigue, generalized weakness. Ambulates w/ rolling walker Skin: no complaints Eyes: no complaints HEENT: no complaints Breasts: no complaints Pulmonary: chronic cough - improving Cardiac: no complaints Gastrointestinal: no complaints Genitourinary/Sexual: no complaints Ob/Gyn: no complaints Musculoskeletal: no complaints Hematology: no complaints Neurologic/Psych: hot flashes   Objective:  Physical Examination:  BP 132/75   Pulse 87   Temp 97.6 F (36.4 C) (Tympanic)   Resp 18   Ht 4' 11 (1.499 m)   Wt 134 lb 14.4 oz (61.2 kg)   SpO2 96%   BMI 27.25 kg/m   GENERAL: Patient is a well appearing female in no acute distress NODES:  Negative axillary, supraclavicular, inguinal lymph node survery LUNGS:  Clear to auscultation bilaterally.   HEART:  Regular rate  and rhythm.  ABDOMEN:  Soft, nontender. No masses or ascites or hernias. Well healed incisions EXTREMITIES:  No peripheral edema. Uses a walker. Limited hip mobility SKIN:  well healed laparoscopic incisions.  NEURO:  Nonfocal. Well oriented.  Appropriate affect.  Pelvic: Exam chaperoned by CMA EGBUS: no lesions or discharge. Vagina: atrophic and agglutinated consistent with post radiation effect. No gross lesions. Unable to full visualize cervix. Nontender. No nodularity. BME: smooth, no palpable masses. Nontender. No nodularity. Rectal: deferred.   Labs:  Per hpi  Imaging:  No imaging on site    Assessment:  Meryn Sarracino  Denman is a 63 y.o. 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 Treated with chemoradiation and received weekly cisplatin  (05/09/2017-06/06/2017; 5 cycles) and external radiation (05/08/2017-06/21/2017) followed by brachytherapy (07/04/2017-07/24/2017) with complete response.  01/05/2021 ASCUS Pap/HRHPV 02/09/21 abnormal AWE vagina on colposcopy; Biopsy 7 o'clock negative. Repeat pap 02/2022- NILM, HPV Negative. 10/04/23- NILM. Clinically NED on exam today however, limited exam d/t mobility and agglutination.   CT abdomen pelvis w/o contrast 09/13/23 for pain and bleeding post colonoscopy was negative for recurrent disease.   Vaginal spotting and bleeding intermittently likely related to radiation effects and atrophy. Declined topical estrogen.    Cough- post asthma exacerbation in November 2024. Resolving.   Hot flashes- secondary to menopause d/t treatments for malignancy. Declined medications.   Medical co-morbidities complicating care: history of cardiac arrythmia, s/p hip surgery with mobility issues, and arthritis. C diff  Plan:   Problem List Items Addressed This Visit   None  We reviewed NCCN surveillance guidelines. She is now 7 years from completion of treatment which is encouraging. We  will continue annual pelvic exams. We discussed symptoms that would be concerning for recurrence and warrant earlier return.   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 Not sexually active. Declined estrogen or dilators.   Hot flashes- likely due to menopause. Declined medications not bothersome.   Previously ASCUS pap in 2022 pap with negative biopsy. Repeat pap in May 2023 negative. Repeat pap in 2024 negative. Plan to repeat vaginal pap annually.   She can follow up with her pcp for her chronic medical problems and other cancer screenings and wellness care.   She can return to gyn onc clinic in 1 year for continued surveillance.   The patient's diagnosis, an outline of the further diagnostic and laboratory studies which will be required, the recommendation, and alternatives were discussed.  All questions were answered to the patient's satisfaction.   Tinnie KANDICE Dawn, NP

## 2024-10-10 NOTE — Progress Notes (Signed)
 Patient is having some leg swelling, fatigue, cough, and some numbness and tingling. She told me that she spots sometimes.

## 2025-10-12 ENCOUNTER — Inpatient Hospital Stay: Admitting: Nurse Practitioner
